# Patient Record
Sex: Female | Born: 1965 | Hispanic: Yes | Marital: Married | Smoking: Never smoker
Health system: Southern US, Community
[De-identification: ages and names within clinical notes are randomized; demographics above are authoritative.]

## PROBLEM LIST (undated history)

## (undated) DIAGNOSIS — M1990 Unspecified osteoarthritis, unspecified site: Secondary | ICD-10-CM

## (undated) DIAGNOSIS — A048 Other specified bacterial intestinal infections: Secondary | ICD-10-CM

## (undated) DIAGNOSIS — U071 COVID-19: Secondary | ICD-10-CM

## (undated) DIAGNOSIS — D649 Anemia, unspecified: Secondary | ICD-10-CM

## (undated) DIAGNOSIS — N61 Mastitis without abscess: Secondary | ICD-10-CM

## (undated) DIAGNOSIS — I10 Essential (primary) hypertension: Secondary | ICD-10-CM

## (undated) DIAGNOSIS — E119 Type 2 diabetes mellitus without complications: Secondary | ICD-10-CM

## (undated) HISTORY — DX: Type 2 diabetes mellitus without complications: E11.9

## (undated) HISTORY — DX: Other specified bacterial intestinal infections: A04.8

## (undated) HISTORY — PX: TONSILLECTOMY: SHX5217

## (undated) HISTORY — PX: FOOT SURGERY: SHX648

## (undated) HISTORY — PX: TONSILLECTOMY: SUR1361

## (undated) HISTORY — DX: Mastitis without abscess: N61.0

---

## 1992-04-23 HISTORY — PX: TUBAL LIGATION: SHX77

## 1992-04-23 HISTORY — PX: BOWEL RESECTION: SHX1257

## 1992-04-23 HISTORY — PX: CHOLECYSTECTOMY: SHX55

## 2005-07-07 ENCOUNTER — Emergency Department: Payer: Self-pay | Admitting: Emergency Medicine

## 2005-12-05 ENCOUNTER — Inpatient Hospital Stay: Payer: Self-pay | Admitting: Internal Medicine

## 2005-12-05 ENCOUNTER — Other Ambulatory Visit: Payer: Self-pay

## 2006-04-02 ENCOUNTER — Ambulatory Visit: Payer: Self-pay

## 2006-04-23 HISTORY — PX: TONSILLECTOMY: SUR1361

## 2006-12-17 ENCOUNTER — Ambulatory Visit: Payer: Self-pay | Admitting: Internal Medicine

## 2007-01-15 ENCOUNTER — Ambulatory Visit: Payer: Self-pay | Admitting: Internal Medicine

## 2007-05-15 ENCOUNTER — Ambulatory Visit: Payer: Self-pay | Admitting: Otolaryngology

## 2007-05-15 ENCOUNTER — Other Ambulatory Visit: Payer: Self-pay

## 2007-05-21 ENCOUNTER — Ambulatory Visit: Payer: Self-pay | Admitting: Otolaryngology

## 2007-12-15 ENCOUNTER — Emergency Department: Payer: Self-pay | Admitting: Unknown Physician Specialty

## 2009-06-20 ENCOUNTER — Encounter: Payer: Self-pay | Admitting: Orthopedic Surgery

## 2009-06-21 ENCOUNTER — Encounter: Payer: Self-pay | Admitting: Orthopedic Surgery

## 2009-07-22 ENCOUNTER — Encounter: Payer: Self-pay | Admitting: Orthopedic Surgery

## 2009-09-07 ENCOUNTER — Ambulatory Visit: Payer: Self-pay | Admitting: Orthopedic Surgery

## 2009-10-06 ENCOUNTER — Ambulatory Visit: Payer: Self-pay | Admitting: Pain Medicine

## 2009-10-12 ENCOUNTER — Ambulatory Visit: Payer: Self-pay | Admitting: Pain Medicine

## 2009-11-15 ENCOUNTER — Ambulatory Visit: Payer: Self-pay | Admitting: Pain Medicine

## 2010-01-31 ENCOUNTER — Ambulatory Visit: Payer: Self-pay | Admitting: Internal Medicine

## 2010-02-09 ENCOUNTER — Ambulatory Visit: Payer: Self-pay | Admitting: Internal Medicine

## 2010-02-11 ENCOUNTER — Emergency Department: Payer: Self-pay | Admitting: Emergency Medicine

## 2011-05-07 ENCOUNTER — Ambulatory Visit: Payer: Self-pay | Admitting: Internal Medicine

## 2011-08-01 ENCOUNTER — Ambulatory Visit: Payer: Self-pay | Admitting: Internal Medicine

## 2011-08-01 LAB — CBC CANCER CENTER
Basophil #: 0 x10 3/mm (ref 0.0–0.1)
Basophil %: 0.3 %
Eosinophil #: 0.1 x10 3/mm (ref 0.0–0.7)
Eosinophil %: 0.8 %
HCT: 34.6 % — ABNORMAL LOW (ref 35.0–47.0)
HGB: 11.4 g/dL — ABNORMAL LOW (ref 12.0–16.0)
Lymphocyte #: 1.5 x10 3/mm (ref 1.0–3.6)
Lymphocyte %: 20 %
MCH: 27.2 pg (ref 26.0–34.0)
MCHC: 32.9 g/dL (ref 32.0–36.0)
MCV: 83 fL (ref 80–100)
Monocyte #: 0.4 x10 3/mm (ref 0.2–0.9)
Monocyte %: 5.8 %
Neutrophil #: 5.4 x10 3/mm (ref 1.4–6.5)
Neutrophil %: 73.1 %
Platelet: 430 x10 3/mm (ref 150–440)
RBC: 4.19 10*6/uL (ref 3.80–5.20)
RDW: 13.9 % (ref 11.5–14.5)
WBC: 7.4 x10 3/mm (ref 3.6–11.0)

## 2011-08-01 LAB — IRON AND TIBC
Iron Bind.Cap.(Total): 445 ug/dL (ref 250–450)
Iron Saturation: 11 %
Iron: 49 ug/dL — ABNORMAL LOW (ref 50–170)
Unbound Iron-Bind.Cap.: 396 ug/dL

## 2011-08-01 LAB — RETICULOCYTES
Absolute Retic Count: 0.128 10*6/uL — ABNORMAL HIGH (ref 0.024–0.084)
Reticulocyte: 3.1 % — ABNORMAL HIGH (ref 0.5–1.5)

## 2011-08-01 LAB — FERRITIN: Ferritin (ARMC): 28 ng/mL (ref 8–388)

## 2011-08-22 ENCOUNTER — Ambulatory Visit: Payer: Self-pay | Admitting: Internal Medicine

## 2011-08-24 LAB — CANCER CENTER HEMOGLOBIN: HGB: 11.2 g/dL — ABNORMAL LOW (ref 12.0–16.0)

## 2011-08-31 ENCOUNTER — Ambulatory Visit: Payer: Self-pay | Admitting: Obstetrics and Gynecology

## 2011-08-31 LAB — PREGNANCY, URINE: Pregnancy Test, Urine: NEGATIVE m[IU]/mL

## 2011-08-31 LAB — POTASSIUM: Potassium: 3.6 mmol/L (ref 3.5–5.1)

## 2011-09-04 ENCOUNTER — Ambulatory Visit: Payer: Self-pay | Admitting: Obstetrics and Gynecology

## 2011-09-04 HISTORY — PX: ABLATION ON ENDOMETRIOSIS: SHX5787

## 2011-09-07 LAB — CBC CANCER CENTER
Basophil #: 0 x10 3/mm (ref 0.0–0.1)
Basophil %: 0.3 %
Eosinophil #: 0.1 x10 3/mm (ref 0.0–0.7)
Eosinophil %: 0.7 %
HCT: 35.5 % (ref 35.0–47.0)
HGB: 11.5 g/dL — ABNORMAL LOW (ref 12.0–16.0)
Lymphocyte #: 1.4 x10 3/mm (ref 1.0–3.6)
Lymphocyte %: 18.4 %
MCH: 27 pg (ref 26.0–34.0)
MCHC: 32.4 g/dL (ref 32.0–36.0)
MCV: 83 fL (ref 80–100)
Monocyte #: 0.4 x10 3/mm (ref 0.2–0.9)
Monocyte %: 6 %
Neutrophil #: 5.6 x10 3/mm (ref 1.4–6.5)
Neutrophil %: 74.6 %
Platelet: 368 x10 3/mm (ref 150–440)
RBC: 4.26 10*6/uL (ref 3.80–5.20)
RDW: 13.9 % (ref 11.5–14.5)
WBC: 7.4 x10 3/mm (ref 3.6–11.0)

## 2011-09-07 LAB — IRON AND TIBC
Iron Bind.Cap.(Total): 376 ug/dL (ref 250–450)
Iron Saturation: 16 %
Iron: 62 ug/dL (ref 50–170)
Unbound Iron-Bind.Cap.: 314 ug/dL

## 2011-09-12 LAB — PATHOLOGY REPORT

## 2011-09-22 ENCOUNTER — Ambulatory Visit: Payer: Self-pay | Admitting: Internal Medicine

## 2011-10-05 LAB — CBC CANCER CENTER
Basophil #: 0 x10 3/mm (ref 0.0–0.1)
Basophil %: 0.5 %
Eosinophil #: 0.1 x10 3/mm (ref 0.0–0.7)
Eosinophil %: 1 %
HCT: 34.7 % — ABNORMAL LOW (ref 35.0–47.0)
HGB: 11.3 g/dL — ABNORMAL LOW (ref 12.0–16.0)
Lymphocyte #: 1.6 x10 3/mm (ref 1.0–3.6)
Lymphocyte %: 21.5 %
MCH: 27.1 pg (ref 26.0–34.0)
MCHC: 32.4 g/dL (ref 32.0–36.0)
MCV: 84 fL (ref 80–100)
Monocyte #: 0.6 x10 3/mm (ref 0.2–0.9)
Monocyte %: 7.5 %
Neutrophil #: 5.3 x10 3/mm (ref 1.4–6.5)
Neutrophil %: 69.5 %
Platelet: 396 x10 3/mm (ref 150–440)
RBC: 4.16 10*6/uL (ref 3.80–5.20)
RDW: 14 % (ref 11.5–14.5)
WBC: 7.6 x10 3/mm (ref 3.6–11.0)

## 2011-10-05 LAB — IRON AND TIBC
Iron Bind.Cap.(Total): 392 ug/dL (ref 250–450)
Iron Saturation: 11 %
Iron: 44 ug/dL — ABNORMAL LOW (ref 50–170)
Unbound Iron-Bind.Cap.: 348 ug/dL

## 2011-10-22 ENCOUNTER — Ambulatory Visit: Payer: Self-pay | Admitting: Internal Medicine

## 2011-12-10 ENCOUNTER — Emergency Department: Payer: Self-pay | Admitting: Unknown Physician Specialty

## 2012-02-07 ENCOUNTER — Ambulatory Visit: Payer: Self-pay | Admitting: Internal Medicine

## 2012-04-23 DIAGNOSIS — N61 Mastitis without abscess: Secondary | ICD-10-CM

## 2012-04-23 HISTORY — PX: FOOT SURGERY: SHX648

## 2012-04-23 HISTORY — DX: Mastitis without abscess: N61.0

## 2012-06-24 ENCOUNTER — Ambulatory Visit: Payer: Self-pay | Admitting: Internal Medicine

## 2012-07-01 ENCOUNTER — Encounter: Payer: Self-pay | Admitting: *Deleted

## 2012-07-14 ENCOUNTER — Ambulatory Visit: Payer: Self-pay | Admitting: General Surgery

## 2012-07-23 ENCOUNTER — Ambulatory Visit (INDEPENDENT_AMBULATORY_CARE_PROVIDER_SITE_OTHER): Payer: 59 | Admitting: General Surgery

## 2012-07-23 ENCOUNTER — Encounter: Payer: Self-pay | Admitting: General Surgery

## 2012-07-23 VITALS — BP 146/92 | HR 62 | Resp 14 | Ht 66.0 in | Wt 297.0 lb

## 2012-07-23 DIAGNOSIS — N63 Unspecified lump in unspecified breast: Secondary | ICD-10-CM
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
## 2012-08-07 ENCOUNTER — Telehealth: Payer: Self-pay | Admitting: General Surgery

## 2012-08-07 NOTE — Telephone Encounter (Signed)
The patient's mammogram was reviewed with the radiologist. It is felt at this time that the findings on mammogram and ultrasound are all very low suspicion and has been changed from category 4 to4A. Patient was called by telephone and advised. A 6 month followup will will be arranged. At that time she will have a a right mammogram and office appointment after for ultrasound and and clinical exam.

## 2012-08-15 ENCOUNTER — Other Ambulatory Visit: Payer: Self-pay | Admitting: *Deleted

## 2012-08-15 DIAGNOSIS — N63 Unspecified lump in unspecified breast: Secondary | ICD-10-CM

## 2012-08-15 NOTE — Progress Notes (Signed)
Patient to have a unilateral right breast diagnostic mammogram in September 2014. This patient will need an office ultrasound.

## 2012-09-08 ENCOUNTER — Ambulatory Visit: Payer: Self-pay | Admitting: Podiatry

## 2012-10-14 ENCOUNTER — Telehealth: Payer: Self-pay | Admitting: *Deleted

## 2012-10-14 NOTE — Telephone Encounter (Signed)
Phone call from pt states she has noticed some skin color changes in the right breast and constant right breast pain.  Was seen April 2014. In recalls for Sept. Pt wants to be seen sooner for reassurance.

## 2012-10-27 ENCOUNTER — Inpatient Hospital Stay
Admission: RE | Admit: 2012-10-27 | Discharge: 2012-10-27 | Disposition: A | Discharge status: Home / Self Care | Payer: Self-pay | Source: Ambulatory Visit | Attending: General Surgery | Admitting: General Surgery

## 2012-10-27 ENCOUNTER — Encounter: Payer: Self-pay | Admitting: General Surgery

## 2012-10-27 ENCOUNTER — Ambulatory Visit (INDEPENDENT_AMBULATORY_CARE_PROVIDER_SITE_OTHER): Payer: 59 | Admitting: General Surgery

## 2012-10-27 VITALS — BP 130/78 | HR 78 | Resp 14 | Ht 66.0 in | Wt 306.0 lb

## 2012-10-27 DIAGNOSIS — N63 Unspecified lump in unspecified breast: Secondary | ICD-10-CM | POA: Insufficient documentation

## 2012-10-27 DIAGNOSIS — N61 Mastitis without abscess: Secondary | ICD-10-CM | POA: Insufficient documentation

## 2012-10-27 MED ORDER — SULFAMETHOXAZOLE-TRIMETHOPRIM 800-160 MG PO TABS: 1.0000 | ORAL_TABLET | Freq: Two times a day (BID) | ORAL | Status: AC

## 2012-10-27 NOTE — Patient Instructions (Addendum)
Take antibiotics as directed. Use ice off and on for 48 hours.

## 2012-10-27 NOTE — Progress Notes (Signed)
Patient ID: Paula Flores, female   DOB: Mar 12, 1966, 46 y.o.   MRN: [id number]  Chief Complaint  Patient presents with  . Other    right breast pain    HPI Paula Flores is a 47 y.o. female here today for continued right breast pain since her last visit in April. Patient reports skin color changes have enlarged and the pain has been increasing. The pain is reported as constant. Patient reports having right foot surgery May 30th, and is currently on pain meds for this. She was scheduled to follow up here in October.HPI  History reviewed. No pertinent past medical history.  Past Surgical History  Procedure Laterality Date  . Cesarean section    . Cholecystectomy    . Tonsillectomy    . Foot surgery Right     History reviewed. No pertinent family history.  Social History History  Substance Use Topics  . Smoking status: Never Smoker   . Smokeless tobacco: Never Used  . Alcohol Use: No    Allergies  Allergen Reactions  . Penicillins Rash    Current Outpatient Prescriptions  Medication Sig Dispense Refill  . aspirin 81 MG tablet Take 81 mg by mouth daily.      Marland Kitchen losartan-hydrochlorothiazide (HYZAAR) 100-25 MG per tablet Take 100 tablets by mouth daily.      . metoprolol tartrate (LOPRESSOR) 25 MG tablet Take 25 mg by mouth 2 (two) times daily.      Marland Kitchen omeprazole (PRILOSEC) 20 MG capsule Take 20 mg by mouth daily.      Marland Kitchen oxyCODONE-acetaminophen (PERCOCET) 10-325 MG per tablet Take 1 tablet by mouth every 4 (four) hours as needed for pain.      Marland Kitchen sulfamethoxazole-trimethoprim (BACTRIM DS) 800-160 MG per tablet Take 1 tablet by mouth 2 (two) times daily.  6 tablet  0   No current facility-administered medications for this visit.    Review of Systems Review of Systems  Constitutional: Negative.   Respiratory: Negative.   Cardiovascular: Negative.     Blood pressure 130/78, pulse 78, resp. rate 14, height 5\' 6"  (1.676 m), weight 306 lb (138.801 kg).  Physical  Exam Physical Exam  Constitutional: She is oriented to person, place, and time. She appears well-developed and well-nourished.  Pulmonary/Chest: Right breast exhibits mass (illdefined thickening upper inner quadrant approx 3 cm with some overlying skin redness. Also noted 2 cm  simular thickening 8o'clock  location. ) and tenderness. Right breast exhibits no nipple discharge. Left breast exhibits no inverted nipple, no mass, no nipple discharge, no skin change and no tenderness.  Neurological: She is alert and oriented to person, place, and time.    Data Reviewed Ultrasound performed here. No defined mass or cyst seen in palpable areas of thickening.   Assessment    Likely mastitis associated with possible fat necrosis     Plan  Trial of antibiotic Septra DS        SANKAR,SEEPLAPUTHUR G 10/28/2012, 7:05 AM

## 2012-10-28 ENCOUNTER — Encounter: Payer: Self-pay | Admitting: General Surgery

## 2012-11-12 ENCOUNTER — Encounter: Payer: Self-pay | Admitting: General Surgery

## 2012-11-12 ENCOUNTER — Ambulatory Visit (INDEPENDENT_AMBULATORY_CARE_PROVIDER_SITE_OTHER): Payer: 59 | Admitting: General Surgery

## 2012-11-12 VITALS — BP 138/70 | HR 78 | Resp 14 | Ht 66.0 in | Wt 301.0 lb

## 2012-11-12 DIAGNOSIS — N61 Mastitis without abscess: Secondary | ICD-10-CM

## 2012-11-12 DIAGNOSIS — N63 Unspecified lump in unspecified breast: Secondary | ICD-10-CM

## 2012-11-12 NOTE — Progress Notes (Signed)
Patient ID: Paula Flores, female   DOB: 1965-09-09, 47 y.o.   MRN: [id number]  Chief Complaint  Patient presents with  . Follow-up    2 week follow up mastitis    HPI Paula Flores is a 47 y.o. female who presents for a 2 week follow up of mastitis in right breast. The patient states she is doing much better since her last visit. She denies any new problems at this time.   HPI  Past Medical History  Diagnosis Date  . Mastitis 2014    Past Surgical History  Procedure Laterality Date  . Cesarean section    . Cholecystectomy    . Tonsillectomy    . Foot surgery Right     History reviewed. No pertinent family history.  Social History History  Substance Use Topics  . Smoking status: Never Smoker   . Smokeless tobacco: Never Used  . Alcohol Use: No    Allergies  Allergen Reactions  . Penicillins Rash    Current Outpatient Prescriptions  Medication Sig Dispense Refill  . losartan-hydrochlorothiazide (HYZAAR) 100-25 MG per tablet Take 100 tablets by mouth daily.      . metoprolol tartrate (LOPRESSOR) 25 MG tablet Take 25 mg by mouth 2 (two) times daily.      Marland Kitchen oxyCODONE-acetaminophen (PERCOCET) 10-325 MG per tablet Take 1 tablet by mouth every 4 (four) hours as needed for pain.       No current facility-administered medications for this visit.    Review of Systems Review of Systems  Constitutional: Negative.   Respiratory: Negative.   Cardiovascular: Negative.     Blood pressure 138/70, pulse 78, resp. rate 14, height 5\' 6"  (1.676 m), weight 301 lb (136.533 kg).  Physical Exam Physical Exam  Constitutional: She is oriented to person, place, and time. She appears well-developed and well-nourished.  Neurological: She is alert and oriented to person, place, and time.  Skin: Skin is warm and dry.  Right breast upper inner quadrant - prior redness has resolved mild thickening underneath much improved. No new findings  Data Reviewed None  Assessment    Fat  necrosis appears to have resolved.      Plan    Follow up as scheduled in October 2014.        SANKAR,SEEPLAPUTHUR G 11/12/2012, 10:48 AM

## 2012-11-12 NOTE — Patient Instructions (Addendum)
Patient to return in October 2014 as previously discussed. Patient to have mammogram done prior to October appointment.

## 2013-01-15 ENCOUNTER — Ambulatory Visit: Payer: 59 | Admitting: General Surgery

## 2013-01-27 ENCOUNTER — Ambulatory Visit: Payer: Self-pay | Admitting: General Surgery

## 2013-01-28 ENCOUNTER — Encounter: Payer: Self-pay | Admitting: General Surgery

## 2013-02-04 ENCOUNTER — Ambulatory Visit: Payer: 59 | Admitting: General Surgery

## 2013-02-09 ENCOUNTER — Ambulatory Visit (INDEPENDENT_AMBULATORY_CARE_PROVIDER_SITE_OTHER): Payer: 59 | Admitting: Podiatry

## 2013-02-09 ENCOUNTER — Encounter: Payer: Self-pay | Admitting: Podiatry

## 2013-02-09 VITALS — BP 147/96 | HR 81 | Resp 16 | Ht 66.0 in | Wt 293.0 lb

## 2013-02-09 DIAGNOSIS — M7750 Other enthesopathy of unspecified foot: Secondary | ICD-10-CM

## 2013-02-09 DIAGNOSIS — M778 Other enthesopathies, not elsewhere classified: Secondary | ICD-10-CM

## 2013-02-09 NOTE — Progress Notes (Signed)
Malachi Bonds presents today for chief complaint of pain to the lateral aspect of her right foot. His been several months now the [facility] had an endoscopic plantar fasciotomy performed and a digger several months to recover. She continues wear her orthotics in her work shoes daily. She states it feels like her foot rolling to the outside and this was making her foot hurt. She states that she giving it a four-week and her boss is suggesting that she consider short-term disability.  Objective: Vital signs are stable she is alert oriented x3 I have reviewed her past medical history medications and allergies. Pulses are strongly palpable bilateral lower extremity pain on palpation to the fourth fifth met cuboid articulation. Radiographic evaluation does not demonstrate any type of osseous abnormalities. No pain on palpation he continued tubercle of the right heel. Mild tenderness on palpation of the peroneal tendons at the insertion site of the fifth metatarsal.  Assessment: Capsulitis of the fourth fifth metatarsal cuboid articulation. All peroneal tendinitis.  Plan: Injected the area today with 20 mg of Kenalog and local anesthetic to the point of maximal tenderness. I will followup with her in 3 weeks.

## 2013-02-17 ENCOUNTER — Ambulatory Visit: Payer: Self-pay | Admitting: Anesthesiology

## 2013-02-18 ENCOUNTER — Other Ambulatory Visit: Payer: Self-pay | Admitting: Anesthesiology

## 2013-02-18 ENCOUNTER — Ambulatory Visit: Payer: Self-pay | Admitting: Anesthesiology

## 2013-02-18 LAB — CBC WITH DIFFERENTIAL/PLATELET
Basophil #: 0.1 10*3/uL (ref 0.0–0.1)
Basophil %: 1 %
Eosinophil #: 0.2 10*3/uL (ref 0.0–0.7)
Eosinophil %: 2.1 %
HCT: 38.4 % (ref 35.0–47.0)
HGB: 12.7 g/dL (ref 12.0–16.0)
Lymphocyte #: 1.9 10*3/uL (ref 1.0–3.6)
Lymphocyte %: 19.7 %
MCH: 27.1 pg (ref 26.0–34.0)
MCHC: 33.2 g/dL (ref 32.0–36.0)
MCV: 82 fL (ref 80–100)
Monocyte #: 0.7 x10 3/mm (ref 0.2–0.9)
Monocyte %: 6.7 %
Neutrophil #: 6.9 10*3/uL — ABNORMAL HIGH (ref 1.4–6.5)
Neutrophil %: 70.5 %
Platelet: 425 10*3/uL (ref 150–440)
RBC: 4.7 10*6/uL (ref 3.80–5.20)
RDW: 13.9 % (ref 11.5–14.5)
WBC: 9.7 10*3/uL (ref 3.6–11.0)

## 2013-03-02 ENCOUNTER — Ambulatory Visit (INDEPENDENT_AMBULATORY_CARE_PROVIDER_SITE_OTHER): Payer: 59 | Admitting: Podiatry

## 2013-03-02 VITALS — BP 141/96 | HR 74 | Resp 16 | Ht 66.0 in | Wt 297.0 lb

## 2013-03-02 DIAGNOSIS — L600 Ingrowing nail: Secondary | ICD-10-CM

## 2013-03-02 MED ORDER — NEOMYCIN-POLYMYXIN-HC 3.5-10000-1 OT SOLN
OTIC | Status: DC
Start: 2013-03-02 — End: 2014-08-16

## 2013-03-02 NOTE — Progress Notes (Signed)
Paula Flores presents today for followup of her dorsolateral pain to her right foot. The last time she was in we injected the area at the fourth fifth metatarsocuboid articulation. She states it is a nice to be able to walk without pain. She is 100% better. She's concerned today about the pain to the toenail of her left hallux. She states the way the toenail turns in it hurts her and she would like to have the holding removed foot all possible.  Objective: Vital signs are stable she is alert and oriented x3. She has no pain on palpation to the dorsal lateral aspect of the right foot. She does have pain on palpation to the sharp incurvated nail margins to the tibial and fibular border of the hallux left it does demonstrate a mycotic the very least dystrophic nail.  Assessment: Well-healing capsulitis right foot. Ingrown nail hallux left.  Plan: We discussed the etiology pathology conservative versus surgical therapies. At this point we've chosen to her perform a total matrixectomy to the hallux left this is performed today with phenol and she was neutralized last couple alcohol no complications. She was her soaking on a twice a day basis and Betadine water and I will followup with her in one week.

## 2013-03-02 NOTE — Patient Instructions (Signed)

## 2013-03-10 ENCOUNTER — Encounter: Payer: Self-pay | Admitting: *Deleted

## 2013-03-12 ENCOUNTER — Ambulatory Visit (INDEPENDENT_AMBULATORY_CARE_PROVIDER_SITE_OTHER): Payer: 59 | Admitting: Podiatry

## 2013-03-12 ENCOUNTER — Encounter: Payer: Self-pay | Admitting: Podiatry

## 2013-03-12 VITALS — BP 133/86 | HR 85 | Resp 16 | Ht 66.0 in | Wt 297.0 lb

## 2013-03-12 DIAGNOSIS — Z9889 Other specified postprocedural states: Secondary | ICD-10-CM

## 2013-03-12 NOTE — Progress Notes (Signed)
Brie presents today for followup of her matrixectomy hallux left. She's been soaking in Epsom salts in warm water.  Objective: Vital signs are stable she is alert and oriented x3. There is no erythema edema cellulitis drainage or odor to the hallux left. Epithelialization is occurring. No signs of infection.  Assessment: Well-healing hallux matrixectomy left.  Plan: Discontinue the use of Betadine is still using it. We'll start using Epsom salts in warm water on a twice a day basis he continue to do so if until completely healed. I will followup with her as needed.

## 2013-03-17 ENCOUNTER — Telehealth: Payer: Self-pay | Admitting: *Deleted

## 2013-03-17 NOTE — Telephone Encounter (Signed)
PT CALLED SAID HER TOE HAS PUSS COMING OUT OF IT AND ITS ACHING.

## 2013-03-17 NOTE — Telephone Encounter (Signed)
Have her in to see me tomorrow.

## 2013-03-18 ENCOUNTER — Ambulatory Visit: Payer: 59 | Admitting: Podiatry

## 2013-03-23 ENCOUNTER — Ambulatory Visit: Payer: Self-pay | Admitting: Anesthesiology

## 2013-03-25 ENCOUNTER — Encounter: Payer: Self-pay | Admitting: Podiatry

## 2013-03-25 ENCOUNTER — Ambulatory Visit (INDEPENDENT_AMBULATORY_CARE_PROVIDER_SITE_OTHER): Payer: 59 | Admitting: Podiatry

## 2013-03-25 VITALS — BP 166/118 | HR 76 | Resp 16 | Ht 66.0 in | Wt 297.0 lb

## 2013-03-25 DIAGNOSIS — L600 Ingrowing nail: Secondary | ICD-10-CM

## 2013-03-25 MED ORDER — CLINDAMYCIN HCL 150 MG PO CAPS: 150.0000 mg | ORAL_CAPSULE | Freq: Three times a day (TID) | ORAL | Status: DC

## 2013-03-25 NOTE — Progress Notes (Signed)
Paula Flores presents today for followup of her hallux nail avulsion left foot she states that it seems to be getting better since they have been taking his antibiotics and she demonstrates a package of amoxicillin to me.  Objective: Vital signs are stable she is alert and oriented x3. The toe does demonstrate a very mild cellulitis the proximal nail fold. No. Was no malodor is noted on palpation and exsanguination.  Assessment: Cellulitis hallux left. This appears to be slowly healing.  Plan: Wrote her prescription for clindamycin and she will continue application of topical antibiotic ointment and I will followup with her in 3-4 weeks.

## 2013-04-13 ENCOUNTER — Ambulatory Visit: Payer: 59 | Admitting: Podiatry

## 2013-06-15 ENCOUNTER — Ambulatory Visit (INDEPENDENT_AMBULATORY_CARE_PROVIDER_SITE_OTHER): Payer: 59 | Admitting: Podiatry

## 2013-06-15 ENCOUNTER — Encounter: Payer: Self-pay | Admitting: Podiatry

## 2013-06-15 VITALS — BP 97/69 | HR 75 | Resp 16

## 2013-06-15 DIAGNOSIS — M79609 Pain in unspecified limb: Secondary | ICD-10-CM

## 2013-06-15 DIAGNOSIS — M722 Plantar fascial fibromatosis: Secondary | ICD-10-CM

## 2013-06-15 MED ORDER — METHYLPREDNISOLONE (PAK) 4 MG PO TABS: ORAL_TABLET | ORAL | Status: DC

## 2013-06-15 NOTE — Progress Notes (Signed)
The right heel is hurting again. She is status post endoscopic plantar fasciotomy of the plantar fascia. She states that she's having plantar pain just distal to the insertion site of the plantar fascia on the calcaneus. She also has pain on lateral aspect of the left foot. She continues to wear her orthotics and a pair of Fila tissues. She states that she's had the pain to the left 5 weeks or so.  Objective: Vital signs are stable she is alert and oriented x3. She has pain on palpation medial calcaneal tubercle and plantar central calcaneal tubercle of the right heel. She also has tenderness on palpation of the fourth fifth metatarsocuboid articulation of the right foot.  Assessment residual plantar fasciitis probable central band with lateral compensatory syndrome right foot.  Plan: Reinjected the right heel today and plantar fascial calcaneal insertion I central bands. And wrote her another prescription for steroid dose pack. We also had her rescan for another pair orthotics. Remember to ask her however trip to [facility] went April 1.

## 2013-07-08 ENCOUNTER — Telehealth: Payer: Self-pay | Admitting: Podiatry

## 2013-07-29 ENCOUNTER — Encounter: Payer: Self-pay | Admitting: Podiatry

## 2013-07-29 ENCOUNTER — Ambulatory Visit (INDEPENDENT_AMBULATORY_CARE_PROVIDER_SITE_OTHER): Payer: 59 | Admitting: Podiatry

## 2013-07-29 DIAGNOSIS — M722 Plantar fascial fibromatosis: Secondary | ICD-10-CM

## 2013-07-29 NOTE — Patient Instructions (Signed)

## 2013-07-29 NOTE — Progress Notes (Signed)
Dispensed patient's orthotics with oral and written instructions for wearing. Patient will follow up with Dr. Milinda Pointer in 1 month for an orthotic check or as needed

## 2013-08-31 ENCOUNTER — Ambulatory Visit (INDEPENDENT_AMBULATORY_CARE_PROVIDER_SITE_OTHER): Payer: 59

## 2013-08-31 ENCOUNTER — Ambulatory Visit (INDEPENDENT_AMBULATORY_CARE_PROVIDER_SITE_OTHER): Payer: 59 | Admitting: Podiatry

## 2013-08-31 VITALS — BP 127/71 | HR 68 | Resp 16

## 2013-08-31 DIAGNOSIS — M79609 Pain in unspecified limb: Secondary | ICD-10-CM

## 2013-08-31 DIAGNOSIS — M722 Plantar fascial fibromatosis: Secondary | ICD-10-CM

## 2013-08-31 DIAGNOSIS — M79673 Pain in unspecified foot: Secondary | ICD-10-CM

## 2013-08-31 DIAGNOSIS — M779 Enthesopathy, unspecified: Secondary | ICD-10-CM

## 2013-08-31 DIAGNOSIS — M7750 Other enthesopathy of unspecified foot: Secondary | ICD-10-CM

## 2013-08-31 DIAGNOSIS — M778 Other enthesopathies, not elsewhere classified: Secondary | ICD-10-CM

## 2013-08-31 NOTE — Progress Notes (Signed)
Paula Flores presents today with a chief complaint of pain to her lateral aspect of her right foot and to the plantar medial calcaneal tubercle area. She states that this is been 1 on for quite some time.  Objective: Vital signs are stable she is alert and oriented x3. Pulses are palpable right foot. She has pain on palpation to the fourth fifth met cuboid articulation site as well as to the plantar aspect of the medial calcaneus.  Assessment: Plantar fasciitis with lateral compensatory syndrome resulting in capsulitis of the fourth fifth met cuboid articulation.  Ran: Discussed etiology pathology conservative versus surgical therapies. Injected both sites today with Kenalog and local anesthetic I will followup with her as needed.

## 2013-10-12 ENCOUNTER — Ambulatory Visit (INDEPENDENT_AMBULATORY_CARE_PROVIDER_SITE_OTHER): Payer: 59 | Admitting: Podiatry

## 2013-10-12 ENCOUNTER — Encounter: Payer: Self-pay | Admitting: Podiatry

## 2013-10-12 DIAGNOSIS — M7750 Other enthesopathy of unspecified foot: Secondary | ICD-10-CM

## 2013-10-12 DIAGNOSIS — M778 Other enthesopathies, not elsewhere classified: Secondary | ICD-10-CM

## 2013-10-12 DIAGNOSIS — M779 Enthesopathy, unspecified: Principal | ICD-10-CM

## 2013-10-12 NOTE — Progress Notes (Signed)
She presents today for followup of her pain to the lateral aspect of her right foot she states it is approximately 50% better. The pain is she is having now is extending from the lateral aspect of her right foot at the posterior aspect of her leg.  Objective: Vital signs are stable she is alert and oriented x3. Pulses are palpable. She has pain on palpation of the fourth fifth 2 Boyd articulation.  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  Plan: Injected the fourth fifth met cuboid articulation with Kenalog and local anesthetic today I will followup with her in one month

## 2013-11-09 ENCOUNTER — Ambulatory Visit: Payer: 59 | Admitting: Podiatry

## 2014-01-06 ENCOUNTER — Ambulatory Visit (INDEPENDENT_AMBULATORY_CARE_PROVIDER_SITE_OTHER): Payer: 59 | Admitting: Podiatry

## 2014-01-06 VITALS — BP 149/91 | HR 65 | Resp 16

## 2014-01-06 DIAGNOSIS — D2120 Benign neoplasm of connective and other soft tissue of unspecified lower limb, including hip: Secondary | ICD-10-CM

## 2014-01-06 DIAGNOSIS — M722 Plantar fascial fibromatosis: Secondary | ICD-10-CM

## 2014-01-06 DIAGNOSIS — D3613 Benign neoplasm of peripheral nerves and autonomic nervous system of lower limb, including hip: Secondary | ICD-10-CM

## 2014-01-06 NOTE — Progress Notes (Signed)
She presents today complaining of pain to the right heel and to the dorsal lateral aspect of the right foot. She denies any changes in her past medical history medications allergies.  Objective: Vital signs are stable she is alert oriented x3 she has what appears to be a plantar fibroma to the central band of the plantar fascia of the right heel just distal to the insertion site and to the surgical site. She also has a palpable Mulder's click to the third interdigital space of the right foot. With radiating pain.  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  Plan: Discussed etiology pathology conservative versus surgical therapies. At this point we injected the plantar fibroma right heel. Injected the neuroma third interdigital space right foot. Both with Kenalog and local anesthetic. Discussed appropriate shoe gear stretching exercises ice therapy shoe gear modifications and I will followup with her in one to 2 months.

## 2014-02-22 ENCOUNTER — Encounter: Payer: Self-pay | Admitting: Podiatry

## 2014-06-09 ENCOUNTER — Emergency Department: Payer: Self-pay | Admitting: Emergency Medicine

## 2014-08-13 NOTE — H&P (Signed)
PATIENT NAME:  Paula Flores, Paula Flores MR#:  [id number] DATE OF BIRTH:  [birth year]  DATE OF ADMISSION:  02/17/2013  CHIEF COMPLAINT:  Persistent intractable low back pain.   PROCEDURE:  None.   HISTORY OF PRESENT ILLNESS:  Ms. Ronalda Walpole is a pleasant 49 year old Hispanic female with a long-standing history of low back pain.  This began following a recent foot surgery she had where she was forced to wear a protective boot for approximately two months, began developing low back pain following that.  She is unaware of any other inciting events which may have caused or exacerbated her pain.  Otherwise, she is describing an aching, annoying, sharp pain that starts in the low back with radiation into the bilateral buttocks and posterior legs.  She does not have any bowel or bladder dysfunction.  She has had some spasming in the posterior legs and calves and the pain wakes her up at night, but she does not report any weakness or numbness or tingling.  The pain is described as aching, annoying, deep, dull, exhausting and sharp.  Aggravating factors include bending, climbing, kneeling, lifting, motion, twisting, walking.  It is worse with activity.  Maximized VAS score is a 10 and better with rest, sleep and medication management.  She had a previous MRI scan that stated 01/07/2013 which shows evidence of an L4 to L5 facet arthropathy and at L5 to S1 there is disk desiccation and diffuse bulge with central disk protrusion which indents the thecal sac.  There is also facet hypertrophy bilaterally.  There is left neural foraminal stenosis and right neural foraminal encroachment.   PAST MEDICAL HISTORY:  Significant for hypertension, chronic renal insufficiency, hyperlipidemia, a distant history of thrombocytopenia, anemia, hypertension and hyperglycemia.   ALLERGIES:  PENICILLIN.   CURRENT MEDICATIONS:  Norvasc, metoprolol, hydrochlorothiazide and losartan, tramadol as needed for pain 2 to 3 times a day and  omeprazole.   FAMILY HISTORY:  She has a family history of hypertension.   SOCIAL HISTORY:  She has never smoked.  Denies tobacco or alcohol use.    PAST SURGICAL HISTORY:  Significant for C-section, gallbladder, tubal ligation.  Of note with the gallbladder surgery she was followed by Dr. Chauncey Reading for some chronic right upper quadrant abdominal pain and is being referred to [facility] for evaluation of this.   PHYSICAL EXAMINATION:   GENERAL:  Reveals a pleasant Hispanic female who speaks some broken [place], but is here with an interpreter, [place].   HEENT:  Pupils are equally round and reactive to light.  Extraocular muscles intact.  HEART:  Regular rate and rhythm.  LUNGS:  Clear to auscultation bilaterally.   MUSCULOSKELETAL:  With the patient in the standing position, she has paraspinous muscle tenderness in the lumbar region bilaterally.  She does have pain on extension, greater with right lateral rotation as compared to left.  The paraspinous muscle tenderness is worse on the right lumbar region, L4 to L5 region.  With the patient in the supine position, she has a positive straight leg raise which does produce a recurrence of back pain, worse on the right side than the left side.  Her strength appears to be well-preserved at 5 over 5 both proximal and distal to the lower extremities.  Sensation appears to be grossly intact.   ASSESSMENT: 1.  Bilateral facet arthropathy, right greater than left.  2.  Lumbar degenerative disk disease most notably at L5 to S1 with neural foraminal encroachment bilaterally.   PLAN:  At  this point, I am going to schedule her for an epidural steroid at the next available date and we are hoping to be able to do this tomorrow.  We are also going to check her platelet count in advance of this.  Her last platelet count back in 2013 was 390,000 or so.   I have talked to her about the need for weight loss and back stretching strengthening exercises and ultimately she  may need some physical therapy as well.     ____________________________ Alvina Filbert. Andree Elk, MD jga:ea D: 02/17/2013 16:50:22 ET T: 02/17/2013 17:36:42 ET JOB#: [id number]  cc: Alvina Filbert. Andree Elk, MD, <Dictator> Isla Pence, MD Alvina Filbert ADAMS MD ELECTRONICALLY SIGNED 02/26/2013 16:52

## 2014-08-16 ENCOUNTER — Ambulatory Visit (INDEPENDENT_AMBULATORY_CARE_PROVIDER_SITE_OTHER): Payer: 59

## 2014-08-16 ENCOUNTER — Encounter: Payer: Self-pay | Admitting: Podiatry

## 2014-08-16 ENCOUNTER — Ambulatory Visit (INDEPENDENT_AMBULATORY_CARE_PROVIDER_SITE_OTHER): Payer: 59 | Admitting: Podiatry

## 2014-08-16 VITALS — BP 124/80 | HR 73 | Resp 16

## 2014-08-16 DIAGNOSIS — M779 Enthesopathy, unspecified: Secondary | ICD-10-CM | POA: Diagnosis not present

## 2014-08-16 DIAGNOSIS — M778 Other enthesopathies, not elsewhere classified: Secondary | ICD-10-CM

## 2014-08-16 DIAGNOSIS — M7751 Other enthesopathy of right foot: Secondary | ICD-10-CM

## 2014-08-17 NOTE — Progress Notes (Signed)
She presents today complaining of pain to her right dorsal lateral aspect of her right foot. Since his finger for quite some time and seems to be getting worse.  Objective: Vital signs are stable she is alert and oriented 3 no changes in her past medical history medications or allergies. Pulses are palpable right foot. She has pain on final plane range of motion of the Lisfranc's joints as well as pain with direct palpation of the fourth and fifth metatarsocuboid articulation.  Assessment: Capsulitis lateral compensatory syndrome fourth and fifth metatarsocuboid articulation right foot.  Plan: Injected today with Kenalog and local anesthetic after sterile Betadine skin prep. Follow up with her in 1 month.

## 2014-10-19 ENCOUNTER — Other Ambulatory Visit: Payer: Self-pay | Admitting: Internal Medicine

## 2014-10-19 DIAGNOSIS — N644 Mastodynia: Secondary | ICD-10-CM

## 2014-10-27 ENCOUNTER — Ambulatory Visit
Admission: RE | Admit: 2014-10-27 | Discharge: 2014-10-27 | Disposition: A | Discharge status: Home / Self Care | Payer: 59 | Source: Ambulatory Visit | Attending: Internal Medicine | Admitting: Internal Medicine

## 2014-10-27 ENCOUNTER — Ambulatory Visit: Payer: Self-pay

## 2014-10-27 DIAGNOSIS — N63 Unspecified lump in breast: Secondary | ICD-10-CM | POA: Insufficient documentation

## 2014-10-27 DIAGNOSIS — N644 Mastodynia: Secondary | ICD-10-CM | POA: Diagnosis present

## 2014-10-27 DIAGNOSIS — N6001 Solitary cyst of right breast: Secondary | ICD-10-CM | POA: Diagnosis not present

## 2014-11-01 ENCOUNTER — Other Ambulatory Visit: Payer: Self-pay | Admitting: Internal Medicine

## 2014-11-01 DIAGNOSIS — N63 Unspecified lump in unspecified breast: Secondary | ICD-10-CM

## 2014-11-01 DIAGNOSIS — R928 Other abnormal and inconclusive findings on diagnostic imaging of breast: Secondary | ICD-10-CM

## 2014-11-02 ENCOUNTER — Other Ambulatory Visit: Payer: Self-pay | Admitting: Internal Medicine

## 2014-11-02 ENCOUNTER — Ambulatory Visit
Admission: RE | Admit: 2014-11-02 | Discharge: 2014-11-02 | Disposition: A | Discharge status: Home / Self Care | Payer: 59 | Source: Ambulatory Visit | Attending: Internal Medicine | Admitting: Internal Medicine

## 2014-11-02 DIAGNOSIS — N641 Fat necrosis of breast: Secondary | ICD-10-CM | POA: Insufficient documentation

## 2014-11-02 DIAGNOSIS — N63 Unspecified lump in unspecified breast: Secondary | ICD-10-CM

## 2014-11-02 DIAGNOSIS — N6031 Fibrosclerosis of right breast: Secondary | ICD-10-CM | POA: Insufficient documentation

## 2014-11-02 DIAGNOSIS — R928 Other abnormal and inconclusive findings on diagnostic imaging of breast: Secondary | ICD-10-CM

## 2014-11-03 HISTORY — PX: BREAST BIOPSY: SHX20

## 2014-11-03 LAB — SURGICAL PATHOLOGY

## 2014-11-16 ENCOUNTER — Ambulatory Visit: Payer: Commercial Managed Care - PPO | Admitting: Podiatry

## 2014-11-24 ENCOUNTER — Ambulatory Visit (INDEPENDENT_AMBULATORY_CARE_PROVIDER_SITE_OTHER): Payer: 59 | Admitting: Podiatry

## 2014-11-24 VITALS — BP 150/89 | HR 67 | Resp 16

## 2014-11-24 DIAGNOSIS — G5781 Other specified mononeuropathies of right lower limb: Secondary | ICD-10-CM

## 2014-11-24 DIAGNOSIS — G5761 Lesion of plantar nerve, right lower limb: Secondary | ICD-10-CM | POA: Diagnosis not present

## 2014-11-24 MED ORDER — TRAMADOL HCL 50 MG PO TABS: 50.0000 mg | ORAL_TABLET | Freq: Three times a day (TID) | ORAL | Status: DC | PRN

## 2014-11-24 NOTE — Progress Notes (Signed)
She presents today for follow-up of her pain to her right foot. She states that the right heel the longer hurts however she still has pain to the lateral aspect of the foot that radiates up the forefoot. She has pain in the forefoot the radiates up her leg and into her toes. She denies any trauma. She is requesting well.  Objective: Vital signs are stable alert and oriented 3. Pulses are palpable. She has pain on palpation to the thirdwith a palpable Mulder's click right foot.  Assessment: Neuroma third interdigital space right foot.  Plan: Injected the third interdigital space today with Kenalog local last that. Wrote a prescription for tramadol. Also set her up for physical therapy. Follow up with her once physical therapy is complete.

## 2015-01-03 ENCOUNTER — Ambulatory Visit (INDEPENDENT_AMBULATORY_CARE_PROVIDER_SITE_OTHER): Payer: 59 | Admitting: Podiatry

## 2015-01-03 ENCOUNTER — Encounter: Payer: Self-pay | Admitting: Podiatry

## 2015-01-03 ENCOUNTER — Telehealth: Payer: Self-pay | Admitting: *Deleted

## 2015-01-03 VITALS — BP 125/88 | HR 74 | Resp 12

## 2015-01-03 DIAGNOSIS — G5761 Lesion of plantar nerve, right lower limb: Secondary | ICD-10-CM

## 2015-01-03 DIAGNOSIS — G5781 Other specified mononeuropathies of right lower limb: Secondary | ICD-10-CM

## 2015-01-03 MED ORDER — TRAMADOL HCL 50 MG PO TABS: 50.0000 mg | ORAL_TABLET | Freq: Three times a day (TID) | ORAL | Status: DC | PRN

## 2015-01-03 MED ORDER — IBUPROFEN 600 MG PO TABS
600.0000 mg | ORAL_TABLET | Freq: Three times a day (TID) | ORAL | Status: DC
Start: 2015-01-03 — End: 2015-07-06

## 2015-01-03 NOTE — Progress Notes (Signed)
Paula Flores presents today with a chief complaint of pain to her right foot. She states that he just seems to be getting worse rather than better if she refers to her right heel and the lateral aspect of the foot and now the forefoot. She states that she has finished physical therapy and can no longer go because it is just too painful for her.  Objective: Vital signs are stable she is alert and oriented 3 she has severe pain on palpation of the plantar fasciitis insertion site of the right heel and on palpation of the fourth and fifth metatarsocuboid articulation.  Assessment: Chronic pain to the right foot secondary to plantar fasciitis and lateral compensatory syndrome now resulting in posterior tibial tendinitis and fifth metatarsal pain.  Plan: Discussed etiology pathology conservative versus surgical therapies. Secondary to failure of all conservative therapies we are going to request an MRI of this right foot. I will follow-up with her once the results are returned.

## 2015-01-03 NOTE — Telephone Encounter (Addendum)
Faxed MRI request to [facility].  Faxed orders for MRI right foot with and without.  Began [employer] with dx G57.61 for MRI with/without contrast [id number], and requires a PEER to PEER [phone] CASE# [id number].  Faxed Dr. Stephenie Acres orders and PEER to PEER confirmation# [id number] expires 02/19/2015, to [phone].

## 2015-01-05 NOTE — Telephone Encounter (Signed)
#   [phone] exp:10/29

## 2015-01-11 ENCOUNTER — Telehealth: Payer: Self-pay | Admitting: *Deleted

## 2015-01-11 DIAGNOSIS — Z01812 Encounter for preprocedural laboratory examination: Secondary | ICD-10-CM

## 2015-01-11 NOTE — Telephone Encounter (Addendum)
Brentt states pt needs a Creatnine lab prior to the MRI, please fax order.  Creatnine serum ordered.  Faxed to [place].

## 2015-01-12 ENCOUNTER — Other Ambulatory Visit
Admission: RE | Admit: 2015-01-12 | Discharge: 2015-01-12 | Disposition: A | Discharge status: Home / Self Care | Payer: 59 | Source: Ambulatory Visit | Attending: Podiatry | Admitting: Podiatry

## 2015-01-12 ENCOUNTER — Ambulatory Visit
Admission: RE | Admit: 2015-01-12 | Discharge: 2015-01-12 | Disposition: A | Discharge status: Home / Self Care | Payer: 59 | Source: Ambulatory Visit | Attending: Podiatry | Admitting: Podiatry

## 2015-01-12 DIAGNOSIS — R600 Localized edema: Secondary | ICD-10-CM | POA: Diagnosis not present

## 2015-01-12 DIAGNOSIS — Z01812 Encounter for preprocedural laboratory examination: Secondary | ICD-10-CM | POA: Diagnosis not present

## 2015-01-12 DIAGNOSIS — G5761 Lesion of plantar nerve, right lower limb: Secondary | ICD-10-CM

## 2015-01-12 LAB — CREATININE, SERUM
Creatinine, Ser: 0.72 mg/dL (ref 0.44–1.00)
GFR calc Af Amer: >60 mL/min (ref >60)
GFR calc non Af Amer: >60 mL/min (ref >60)

## 2015-01-12 MED ORDER — GADOBENATE DIMEGLUMINE 529 MG/ML IV SOLN
20.0000 mL | Freq: Once | INTRAVENOUS | Status: AC | PRN
  Administered 2015-01-12: 20 mL via INTRAVENOUS

## 2015-01-13 ENCOUNTER — Telehealth: Payer: Self-pay | Admitting: *Deleted

## 2015-01-13 NOTE — Telephone Encounter (Deleted)
-----   Message from Garrel Ridgel, [place] sent at 01/12/2015  5:03 PM EDT ----- Please send for an over read and have them concentrate on the dorsal lateral foot.

## 2015-01-13 NOTE — Telephone Encounter (Addendum)
-----   Message from Garrel Ridgel, [place] sent at 01/12/2015  5:03 PM EDT ----- Please send for an over read and have them concentrate on the dorsal lateral foot.  Faxed request for MRI disc copy [id number].  Mailed disc and orders.

## 2015-02-02 ENCOUNTER — Telehealth: Payer: Self-pay | Admitting: *Deleted

## 2015-02-02 ENCOUNTER — Encounter: Payer: Self-pay | Admitting: *Deleted

## 2015-02-02 DIAGNOSIS — M179 Osteoarthritis of knee, unspecified: Secondary | ICD-10-CM | POA: Insufficient documentation

## 2015-02-02 DIAGNOSIS — M1710 Unilateral primary osteoarthritis, unspecified knee: Secondary | ICD-10-CM | POA: Insufficient documentation

## 2015-02-02 NOTE — Telephone Encounter (Signed)
Called pt l/m to call and schedule appt to discuss overread MRI results

## 2015-02-07 ENCOUNTER — Encounter: Payer: Self-pay | Admitting: Podiatry

## 2015-02-07 ENCOUNTER — Ambulatory Visit (INDEPENDENT_AMBULATORY_CARE_PROVIDER_SITE_OTHER): Payer: 59 | Admitting: Podiatry

## 2015-02-07 VITALS — BP 158/86 | HR 69 | Resp 12

## 2015-02-07 DIAGNOSIS — M7751 Other enthesopathy of right foot: Secondary | ICD-10-CM

## 2015-02-07 DIAGNOSIS — M779 Enthesopathy, unspecified: Principal | ICD-10-CM

## 2015-02-07 DIAGNOSIS — M778 Other enthesopathies, not elsewhere classified: Secondary | ICD-10-CM

## 2015-02-07 DIAGNOSIS — M19071 Primary osteoarthritis, right ankle and foot: Secondary | ICD-10-CM

## 2015-02-07 MED ORDER — TRAMADOL HCL 50 MG PO TABS: 50.0000 mg | ORAL_TABLET | Freq: Four times a day (QID) | ORAL | Status: DC | PRN

## 2015-02-08 NOTE — Progress Notes (Signed)
She presents today for her MRI results. She states that her right foot is doing much better at this point. She still has some tenderness in the forefoot where she has a persistent neuroma and some moderate tenderness on the dorsal lateral aspect of the right foot.  Objective: Vital signs are stable she's alert and oriented 3 pulses are palpable. She still has pain on palpation to the third fourth and fifth metatarsal cuneiform cuboid joint. Mild tenderness on palpation of the neuroma third interdigital space right foot. MRI results most significant for osteoarthritic changes/degenerative changes of the third fourth and fifth metatarsal cuneiform cuboid articulation.  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  Plan: Discussed etiology pathology conservative versus surgical therapies. At this point she will continue with her anti-inflammatories and I will follow-up with her on an as-needed basis.

## 2015-05-23 ENCOUNTER — Telehealth: Payer: Self-pay | Admitting: *Deleted

## 2015-05-23 NOTE — Telephone Encounter (Signed)
Faxed request for refill Tramadol.  Refill denied pt needs an appt if continuing to have a problem. Return fax Tramadol denied.

## 2015-07-06 ENCOUNTER — Ambulatory Visit (INDEPENDENT_AMBULATORY_CARE_PROVIDER_SITE_OTHER): Payer: 59 | Admitting: Podiatry

## 2015-07-06 ENCOUNTER — Encounter: Payer: Self-pay | Admitting: Podiatry

## 2015-07-06 VITALS — BP 166/90 | HR 77 | Resp 16

## 2015-07-06 DIAGNOSIS — M779 Enthesopathy, unspecified: Principal | ICD-10-CM

## 2015-07-06 DIAGNOSIS — M7751 Other enthesopathy of right foot: Secondary | ICD-10-CM | POA: Diagnosis not present

## 2015-07-06 DIAGNOSIS — G5761 Lesion of plantar nerve, right lower limb: Secondary | ICD-10-CM

## 2015-07-06 DIAGNOSIS — M19071 Primary osteoarthritis, right ankle and foot: Secondary | ICD-10-CM | POA: Diagnosis not present

## 2015-07-06 DIAGNOSIS — I10 Essential (primary) hypertension: Secondary | ICD-10-CM | POA: Insufficient documentation

## 2015-07-06 DIAGNOSIS — M722 Plantar fascial fibromatosis: Secondary | ICD-10-CM

## 2015-07-06 DIAGNOSIS — M778 Other enthesopathies, not elsewhere classified: Secondary | ICD-10-CM

## 2015-07-06 MED ORDER — TRAMADOL HCL 50 MG PO TABS: 50.0000 mg | ORAL_TABLET | Freq: Two times a day (BID) | ORAL | Status: DC

## 2015-07-06 NOTE — Progress Notes (Signed)
She presents today with a chief complaint of a painful foot right. She states they're really has never gotten any better. She states that it hurts from here as she points to the third interdigital space always back to hear she points to the dorsal lateral aspect of the right foot. She states that her primary for care provider recommended that she receive her tramadol from our practice for her foot.  Objective: Vital signs are stable alert and oriented 3. Pulses are palpable right foot. She does have a history of back pain. She has palpable neuroma third interdigital space of the right foot. With radiating pain along the area which she states hurts.  Assessment: Neuroma third interdigital space right foot.  Plan: Injected today with her first dose of dehydrated alcohol. And I provided her with a prescription of tramadol 50 mg #50 one by mouth twice a day follow-up with her in 3 weeks for her second dose of dehydrated alcohol.

## 2015-07-27 ENCOUNTER — Encounter: Payer: Self-pay | Admitting: Podiatry

## 2015-07-27 ENCOUNTER — Ambulatory Visit (INDEPENDENT_AMBULATORY_CARE_PROVIDER_SITE_OTHER): Payer: 59 | Admitting: Podiatry

## 2015-07-27 VITALS — BP 180/100 | HR 77 | Resp 16

## 2015-07-27 DIAGNOSIS — G5761 Lesion of plantar nerve, right lower limb: Secondary | ICD-10-CM | POA: Diagnosis not present

## 2015-07-27 NOTE — Progress Notes (Signed)
She presents today for follow-up of her neuroma third interdigital space of the right foot. She states that it is approximately 50% better my whole foot feels better.  Objective: Vital signs are stable she is alert and oriented 3 minimal pain on palpation to the neuroma third interdigital space right foot.  Assessment: Well-healing neuroma third interdigital space right foot.  Plan: Injected her second dose of dehydrated alcohol and will follow up with her in 3 weeks.

## 2015-08-17 ENCOUNTER — Ambulatory Visit (INDEPENDENT_AMBULATORY_CARE_PROVIDER_SITE_OTHER): Payer: 59 | Admitting: Podiatry

## 2015-08-17 ENCOUNTER — Encounter: Payer: Self-pay | Admitting: Podiatry

## 2015-08-17 DIAGNOSIS — G5761 Lesion of plantar nerve, right lower limb: Secondary | ICD-10-CM | POA: Diagnosis not present

## 2015-08-17 DIAGNOSIS — M7751 Other enthesopathy of right foot: Secondary | ICD-10-CM | POA: Diagnosis not present

## 2015-08-17 DIAGNOSIS — M778 Other enthesopathies, not elsewhere classified: Secondary | ICD-10-CM

## 2015-08-17 DIAGNOSIS — M779 Enthesopathy, unspecified: Secondary | ICD-10-CM

## 2015-08-17 NOTE — Progress Notes (Signed)
She presents today for her third dose of dehydrated alcohol to the third interspace of the right foot. She states that it is feeling much better. She states that however she is having pain to the fourth and fifth cuboid articulation area she points to this.  Objective: Vital signs are stable alert and oriented 3. Pulses are palpable. She has pain on palpation and range of motion of the fourth fifth met cuboid articulation. She has a palpable Mulder's click to the third interdigital space of the right foot. Much decrease in pain from previous evaluation.  Assessment: Capsulitis dorsolateral aspect of the right foot as well as neuroma third interdigital space right foot.  Plan: Injected third dose of dehydrated alcohol to the third interdigital space of the right foot. And injected cortisone to the dorsal lateral aspect of the right foot overlying the fourth and fifth metatarsocuboid articulation. Follow up with her in 3 weeks.

## 2015-09-07 ENCOUNTER — Ambulatory Visit: Payer: 59 | Admitting: Podiatry

## 2015-09-14 LAB — HM PAP SMEAR: HM Pap smear: NEGATIVE

## 2015-10-05 ENCOUNTER — Encounter
Admission: RE | Admit: 2015-10-05 | Discharge: 2015-10-05 | Disposition: A | Discharge status: Home / Self Care | Payer: 59 | Source: Ambulatory Visit | Attending: Obstetrics and Gynecology | Admitting: Obstetrics and Gynecology

## 2015-10-05 DIAGNOSIS — Z01810 Encounter for preprocedural cardiovascular examination: Secondary | ICD-10-CM | POA: Insufficient documentation

## 2015-10-05 DIAGNOSIS — Z01812 Encounter for preprocedural laboratory examination: Secondary | ICD-10-CM | POA: Diagnosis present

## 2015-10-05 HISTORY — DX: Unspecified osteoarthritis, unspecified site: M19.90

## 2015-10-05 HISTORY — DX: Essential (primary) hypertension: I10

## 2015-10-05 HISTORY — DX: Anemia, unspecified: D64.9

## 2015-10-05 LAB — CBC
HCT: 36.4 % (ref 35.0–47.0)
Hemoglobin: 12 g/dL (ref 12.0–16.0)
MCH: 26.6 pg (ref 26.0–34.0)
MCHC: 32.9 g/dL (ref 32.0–36.0)
MCV: 80.7 fL (ref 80.0–100.0)
Platelets: 389 10*3/uL (ref 150–440)
RBC: 4.51 MIL/uL (ref 3.80–5.20)
RDW: 14 % (ref 11.5–14.5)
WBC: 8.1 10*3/uL (ref 3.6–11.0)

## 2015-10-05 LAB — BASIC METABOLIC PANEL
Anion gap: 9 (ref 5–15)
BUN: 18 mg/dL (ref 6–20)
CO2: 28 mmol/L (ref 22–32)
Calcium: 9.7 mg/dL (ref 8.9–10.3)
Chloride: 99 mmol/L — ABNORMAL LOW (ref 101–111)
Creatinine, Ser: 0.73 mg/dL (ref 0.44–1.00)
GFR calc Af Amer: >60 mL/min (ref >60)
GFR calc non Af Amer: >60 mL/min (ref >60)
Glucose, Bld: 109 mg/dL — ABNORMAL HIGH (ref 65–99)
Potassium: 3.1 mmol/L — ABNORMAL LOW (ref 3.5–5.1)
Sodium: 136 mmol/L (ref 135–145)

## 2015-10-06 NOTE — Pre-Procedure Instructions (Signed)
FAXED LABS WITH Kt 3.1 AND NOTIFIED [place]

## 2015-10-07 LAB — TYPE AND SCREEN
ABO/RH(D): A POS
Antibody Screen: NEGATIVE

## 2015-10-13 ENCOUNTER — Ambulatory Visit: Payer: 59 | Admitting: Certified Registered Nurse Anesthetist

## 2015-10-13 ENCOUNTER — Encounter: Payer: Self-pay | Admitting: *Deleted

## 2015-10-13 ENCOUNTER — Encounter
Admission: RE | Disposition: A | Discharge status: Home / Self Care | Payer: Self-pay | Source: Ambulatory Visit | Attending: Obstetrics and Gynecology

## 2015-10-13 ENCOUNTER — Ambulatory Visit
Admission: RE | Admit: 2015-10-13 | Discharge: 2015-10-13 | Disposition: A | Discharge status: Home / Self Care | Payer: 59 | Source: Ambulatory Visit | Attending: Obstetrics and Gynecology | Admitting: Obstetrics and Gynecology

## 2015-10-13 DIAGNOSIS — Z79899 Other long term (current) drug therapy: Secondary | ICD-10-CM | POA: Insufficient documentation

## 2015-10-13 DIAGNOSIS — N856 Intrauterine synechiae: Secondary | ICD-10-CM | POA: Diagnosis not present

## 2015-10-13 DIAGNOSIS — Z7984 Long term (current) use of oral hypoglycemic drugs: Secondary | ICD-10-CM | POA: Diagnosis not present

## 2015-10-13 DIAGNOSIS — Z9889 Other specified postprocedural states: Secondary | ICD-10-CM

## 2015-10-13 DIAGNOSIS — N938 Other specified abnormal uterine and vaginal bleeding: Secondary | ICD-10-CM | POA: Insufficient documentation

## 2015-10-13 DIAGNOSIS — M1991 Primary osteoarthritis, unspecified site: Secondary | ICD-10-CM | POA: Diagnosis not present

## 2015-10-13 DIAGNOSIS — E119 Type 2 diabetes mellitus without complications: Secondary | ICD-10-CM | POA: Insufficient documentation

## 2015-10-13 DIAGNOSIS — I10 Essential (primary) hypertension: Secondary | ICD-10-CM | POA: Insufficient documentation

## 2015-10-13 HISTORY — PX: HYSTEROSCOPY WITH D & C: SHX1775

## 2015-10-13 LAB — POCT I-STAT 4, (NA,K, GLUC, HGB,HCT)
Glucose, Bld: 100 mg/dL — ABNORMAL HIGH (ref 65–99)
Glucose, Bld: 99 mg/dL (ref 65–99)
Glucose, Bld: 93 mg/dL (ref 65–99)
HCT: 34 % — ABNORMAL LOW (ref 36.0–46.0)
HCT: 36 % (ref 36.0–46.0)
HCT: 38 % (ref 36.0–46.0)
Hemoglobin: 11.6 g/dL — ABNORMAL LOW (ref 12.0–15.0)
Hemoglobin: 12.2 g/dL (ref 12.0–15.0)
Hemoglobin: 12.9 g/dL (ref 12.0–15.0)
Potassium: 8.5 mmol/L (ref 3.5–5.1)
Potassium: >8.5 mmol/L (ref 3.5–5.1)
Potassium: 3.5 mmol/L (ref 3.5–5.1)
Sodium: 132 mmol/L — ABNORMAL LOW (ref 135–145)
Sodium: 139 mmol/L (ref 135–145)
Sodium: 141 mmol/L (ref 135–145)

## 2015-10-13 LAB — POCT PREGNANCY, URINE: Preg Test, Ur: NEGATIVE

## 2015-10-13 LAB — POTASSIUM: Potassium: 3.4 mmol/L — ABNORMAL LOW (ref 3.5–5.1)

## 2015-10-13 LAB — GLUCOSE, CAPILLARY
Glucose-Capillary: 96 mg/dL (ref 65–99)
Glucose-Capillary: 99 mg/dL (ref 65–99)

## 2015-10-13 SURGERY — DILATATION AND CURETTAGE /HYSTEROSCOPY
Anesthesia: General

## 2015-10-13 MED ORDER — KETOROLAC TROMETHAMINE 30 MG/ML IJ SOLN
INTRAMUSCULAR | Status: DC | PRN
  Administered 2015-10-13: 30 mg via INTRAVENOUS

## 2015-10-13 MED ORDER — PROPOFOL 10 MG/ML IV BOLUS
INTRAVENOUS | Status: DC | PRN
  Administered 2015-10-13: 180 mg via INTRAVENOUS

## 2015-10-13 MED ORDER — FENTANYL CITRATE (PF) 100 MCG/2ML IJ SOLN
INTRAMUSCULAR | Status: AC
  Administered 2015-10-13: 25 ug via INTRAVENOUS
  Filled 2015-10-13: qty 2

## 2015-10-13 MED ORDER — FAMOTIDINE 20 MG PO TABS
ORAL_TABLET | ORAL | Status: AC
  Administered 2015-10-13: 20 mg via ORAL
  Filled 2015-10-13: qty 1

## 2015-10-13 MED ORDER — HYDROCODONE-ACETAMINOPHEN 5-325 MG PO TABS
ORAL_TABLET | ORAL | Status: AC
  Administered 2015-10-13: 1 via ORAL
  Filled 2015-10-13: qty 1

## 2015-10-13 MED ORDER — ONDANSETRON HCL 4 MG/2ML IJ SOLN
INTRAMUSCULAR | Status: DC | PRN
  Administered 2015-10-13: 4 mg via INTRAVENOUS

## 2015-10-13 MED ORDER — GLYCOPYRROLATE 0.2 MG/ML IJ SOLN
INTRAMUSCULAR | Status: DC | PRN
  Administered 2015-10-13: 0.2 mg via INTRAVENOUS

## 2015-10-13 MED ORDER — ONDANSETRON HCL 4 MG/2ML IJ SOLN: 4.0000 mg | Freq: Once | INTRAMUSCULAR | Status: DC | PRN

## 2015-10-13 MED ORDER — MIDAZOLAM HCL 2 MG/2ML IJ SOLN
INTRAMUSCULAR | Status: DC | PRN
  Administered 2015-10-13: 2 mg via INTRAVENOUS

## 2015-10-13 MED ORDER — IBUPROFEN 600 MG PO TABS: 600.0000 mg | ORAL_TABLET | Freq: Four times a day (QID) | ORAL | Status: DC | PRN

## 2015-10-13 MED ORDER — FAMOTIDINE 20 MG PO TABS
20.0000 mg | ORAL_TABLET | Freq: Once | ORAL | Status: AC
  Administered 2015-10-13: 20 mg via ORAL

## 2015-10-13 MED ORDER — FENTANYL CITRATE (PF) 100 MCG/2ML IJ SOLN
25.0000 ug | INTRAMUSCULAR | Status: DC | PRN
  Administered 2015-10-13 (×4): 25 ug via INTRAVENOUS

## 2015-10-13 MED ORDER — HYDROCODONE-ACETAMINOPHEN 5-325 MG PO TABS
1.0000 | ORAL_TABLET | Freq: Four times a day (QID) | ORAL | Status: DC | PRN
  Administered 2015-10-13: 1 via ORAL

## 2015-10-13 MED ORDER — LIDOCAINE HCL (CARDIAC) 20 MG/ML IV SOLN
INTRAVENOUS | Status: DC | PRN
  Administered 2015-10-13: 100 mg via INTRAVENOUS

## 2015-10-13 MED ORDER — SODIUM CHLORIDE 0.9 % IV SOLN
INTRAVENOUS | Status: DC
  Administered 2015-10-13: 11:00:00 via INTRAVENOUS

## 2015-10-13 MED ORDER — HYDROCODONE-ACETAMINOPHEN 5-325 MG PO TABS: 1.0000 | ORAL_TABLET | Freq: Four times a day (QID) | ORAL | Status: DC | PRN

## 2015-10-13 MED ORDER — FENTANYL CITRATE (PF) 100 MCG/2ML IJ SOLN
INTRAMUSCULAR | Status: DC | PRN
  Administered 2015-10-13 (×2): 50 ug via INTRAVENOUS

## 2015-10-13 MED ORDER — SUCCINYLCHOLINE CHLORIDE 20 MG/ML IJ SOLN
INTRAMUSCULAR | Status: DC | PRN
Start: 2015-10-13 — End: 2015-10-13
  Administered 2015-10-13: 120 mg via INTRAVENOUS

## 2015-10-13 SURGICAL SUPPLY — 15 items
ABLATOR ENDOMETRIAL MYOSURE (ABLATOR) ×2 IMPLANT
CATH ROBINSON RED A/P 16FR (CATHETERS) ×2 IMPLANT
ELECT REM PT RETURN 9FT ADLT (ELECTROSURGICAL) ×2
ELECTRODE REM PT RTRN 9FT ADLT (ELECTROSURGICAL) ×1 IMPLANT
GLOVE BIO SURGEON STRL SZ7 (GLOVE) ×4 IMPLANT
GLOVE INDICATOR 7.5 STRL GRN (GLOVE) ×4 IMPLANT
GOWN STRL REUS W/ TWL LRG LVL3 (GOWN DISPOSABLE) ×2 IMPLANT
GOWN STRL REUS W/TWL LRG LVL3 (GOWN DISPOSABLE) ×2
IV LACTATED RINGERS 1000ML (IV SOLUTION) ×4 IMPLANT
KIT RM TURNOVER CYSTO AR (KITS) ×2 IMPLANT
PACK DNC HYST (MISCELLANEOUS) ×2 IMPLANT
PAD OB MATERNITY 4.3X12.25 (PERSONAL CARE ITEMS) ×2 IMPLANT
PAD PREP 24X41 OB/GYN DISP (PERSONAL CARE ITEMS) ×2 IMPLANT
TOWEL OR 17X26 4PK STRL BLUE (TOWEL DISPOSABLE) ×2 IMPLANT
TUBING CONNECTING 10 (TUBING) ×2 IMPLANT

## 2015-10-13 NOTE — OR Nursing (Signed)
Discussed discharged instructions in room with pt, husband and interpreter. Pt and husband understanding and copy of discharge instructions given to them to take home.(english and spanish).

## 2015-10-13 NOTE — Anesthesia Procedure Notes (Signed)
Procedure Name: Intubation Performed by: Lakynn Halvorsen Pre-anesthesia Checklist: Patient identified, Patient being monitored, Timeout performed, Emergency Drugs available and Suction available Patient Re-evaluated:Patient Re-evaluated prior to inductionOxygen Delivery Method: Circle system utilized Preoxygenation: Pre-oxygenation with 100% oxygen Intubation Type: IV induction Ventilation: Mask ventilation without difficulty Laryngoscope Size: Mac and 3 Grade View: Grade I Tube type: Oral Tube size: 7.0 mm Number of attempts: 1 Airway Equipment and Method: Stylet Placement Confirmation: ETT inserted through vocal cords under direct vision,  positive ETCO2 and breath sounds checked- equal and bilateral Secured at: 21 cm Tube secured with: Tape Dental Injury: Teeth and Oropharynx as per pre-operative assessment        

## 2015-10-13 NOTE — Op Note (Signed)
Patient Name: Paula Flores Date of Procedure: 10/13/2015  Preoperative Diagnosis: 1) 50 y.o. with abnormal uterine bleeding 2) History of prior endometrial ablation 3) Endometrial fluid collection on transvaginal ultrasound  Postoperative Diagnosis: 1) 50 y.o. with abnormal uterine bleeding 2) Lower uterine segment stenosis  Operation Performed: Hysteroscopy, dilation and targeted curettage using myosure  Indication: Abnormal uterine bleeding   Anesthesia:  General  Primary Surgeon: Malachy Mood, MD  Assistant: none  Preoperative Antibiotics: none  Estimated Blood Loss: minimal  IV Fluids: 642mL  Urine Output:: ~36mL straight cath  Drains or Tubes: none  Implants: none  Specimens Removed: endometrial curettings  Complications: none  Intraoperative Findings:  Normal parous cervix, on advancing hysteroscope very pinpoint opening was noted at the top of the cervical canal.  Old brown blood was noted oozing from this opening.  This area was to small to advance the hysteroscope through, it was also not definitive as to whether this represented the right tubal ostia or an stricture in the lower uterine segment/cervix.  The myosure device was able to be advanced into this area and using the mysure the surrounding tissue was able to be carefully resected.  This allowed the mysure scope to be advanced and it became apparent that this was in fact a stricture.  Advancing into the cavity the mymetrium appeared well ablated.  The were no other synechia encountered.  The lining was sampled with the myosure.     Patient Condition: stable  Procedure in Detail:  Patient was taken to the operating room were she was administered general endotracheal anesthesia.  She was positioned in the dorsal lithotomy position utilizing Allen stirups, prepped and draped in the usual sterile fashion.   Prior to proceeding with the case a time out was performed.  Attention was turned to the  patient's pelvis.  A red rubber catheter was used to empty the patient's bladder.  An operative speculum was placed to allow visualization of the cervix.  The anterior lip of the cervix was grasped with a single tooth tenaculum and the cervix was sequentially dilated using pratt dilators.  The hysteroscope was then advanced into the uterine cavity encountering a stricture vs the right tubal ostia.  The was enlarged using the mysure device and was in fact found to be a lower uterine segment stricture which then allowed access into the true endometrial cavity.  The cavity otherwise had normal appear postablative changes.  Curettage was performed using the myosure and the resulting specimen collected and sent to pathology.    The single tooth tenaculum was removed from the cervix.  The tenaculum sites and cervix were noted to be  Hemostatic before removing the operative speculum.  Sponge needle and instrument counts were corrects times two.  The patient tolerated the procedure well and was taken to the recovery room in stable condition.

## 2015-10-13 NOTE — Discharge Instructions (Signed)

## 2015-10-13 NOTE — Anesthesia Preprocedure Evaluation (Addendum)
Anesthesia Evaluation  Patient identified by MRN, date of birth, ID band Patient awake    Reviewed: Allergy & Precautions, NPO status , Patient's Chart, lab work & pertinent test results  Airway Mallampati: II  TM Distance: >3 FB     Dental  (+) Chipped   Pulmonary neg pulmonary ROS,    Pulmonary exam normal        Cardiovascular hypertension, Pt. on medications Normal cardiovascular exam     Neuro/Psych negative neurological ROS  negative psych ROS   GI/Hepatic negative GI ROS, Neg liver ROS,   Endo/Other  diabetes, Well Controlled, Type 2, Oral Hypoglycemic Agents  Renal/GU negative Renal ROS     Musculoskeletal  (+) Arthritis , Osteoarthritis,    Abdominal (+) + obese,   Peds negative pediatric ROS (+)  Hematology  (+) anemia ,   Anesthesia Other Findings DM HTN Morbid obesity  Reproductive/Obstetrics                            Anesthesia Physical Anesthesia Plan  ASA: III  Anesthesia Plan: General   Post-op Pain Management:    Induction: Intravenous, Rapid sequence and Cricoid pressure planned  Airway Management Planned: Oral ETT  Additional Equipment:   Intra-op Plan:   Post-operative Plan: Extubation in OR  Informed Consent: I have reviewed the patients History and Physical, chart, labs and discussed the procedure including the risks, benefits and alternatives for the proposed anesthesia with the patient or authorized representative who has indicated his/her understanding and acceptance.   Dental advisory given  Plan Discussed with: CRNA and Surgeon  Anesthesia Plan Comments:         Anesthesia Quick Evaluation

## 2015-10-13 NOTE — Transfer of Care (Signed)
Immediate Anesthesia Transfer of Care Note  Patient: Paula Flores  Procedure(s) Performed: Procedure(s): DILATATION AND CURETTAGE /HYSTEROSCOPY (N/A)  Patient Location: PACU  Anesthesia Type:General  Level of Consciousness: awake and alert   Airway & Oxygen Therapy: Patient Spontanous Breathing and Patient connected to face mask oxygen  Post-op Assessment: Report given to RN and Post -op Vital signs reviewed and stable  Post vital signs: Reviewed and stable  Last Vitals:  Filed Vitals:   10/13/15 1022  BP: 156/102  Pulse: 74  Temp: 36.3 C  Resp: 20    Last Pain:  Filed Vitals:   10/13/15 1040  PainSc: 6          Complications: No apparent anesthesia complications

## 2015-10-13 NOTE — OR Nursing (Signed)
intrepreter Oliver.

## 2015-10-14 LAB — SURGICAL PATHOLOGY

## 2015-10-14 NOTE — Anesthesia Postprocedure Evaluation (Signed)
Anesthesia Post Note  Patient: Paula Flores  Procedure(s) Performed: Procedure(s) (LRB): DILATATION AND CURETTAGE /HYSTEROSCOPY (N/A)  Patient location during evaluation: PACU Anesthesia Type: General Level of consciousness: awake and alert and oriented Pain management: pain level controlled Vital Signs Assessment: post-procedure vital signs reviewed and stable Respiratory status: spontaneous breathing Cardiovascular status: blood pressure returned to baseline Anesthetic complications: no    Last Vitals:  Filed Vitals:   10/13/15 1345 10/13/15 1445  BP: 128/76 141/92  Pulse: 57 62  Temp:    Resp: 16 16    Last Pain:  Filed Vitals:   10/13/15 1448  PainSc: 3                  Ermel Verne

## 2016-05-25 DIAGNOSIS — M25519 Pain in unspecified shoulder: Secondary | ICD-10-CM | POA: Insufficient documentation

## 2016-09-05 ENCOUNTER — Ambulatory Visit (INDEPENDENT_AMBULATORY_CARE_PROVIDER_SITE_OTHER): Payer: 59 | Admitting: Podiatry

## 2016-09-05 ENCOUNTER — Encounter: Payer: Self-pay | Admitting: Podiatry

## 2016-09-05 ENCOUNTER — Ambulatory Visit (INDEPENDENT_AMBULATORY_CARE_PROVIDER_SITE_OTHER): Payer: 59

## 2016-09-05 DIAGNOSIS — M7670 Peroneal tendinitis, unspecified leg: Secondary | ICD-10-CM | POA: Diagnosis not present

## 2016-09-05 DIAGNOSIS — L309 Dermatitis, unspecified: Secondary | ICD-10-CM

## 2016-09-05 DIAGNOSIS — M779 Enthesopathy, unspecified: Secondary | ICD-10-CM

## 2016-09-05 MED ORDER — METHYLPREDNISOLONE 4 MG PO TBPK: ORAL_TABLET | ORAL | 0 refills | Status: DC

## 2016-09-05 MED ORDER — TRIAMCINOLONE ACETONIDE 0.025 % EX CREA: 1.0000 "application " | TOPICAL_CREAM | Freq: Two times a day (BID) | CUTANEOUS | 1 refills | Status: DC

## 2016-09-05 NOTE — Progress Notes (Signed)
She presents today states that her feet are doing very well and then all of a sudden she fell on black ice hurting both feet. She denies fever chills nausea vomiting muscle aches and pains states that there just painful.  Objective: Vital signs are stable she's alert and 3 pulses are palpable pain no ecchymosis or erythema cellulitis drainage or odor. She has pain on palpation of the peroneus longus as it courses beneath the cuboid arcuate portion.  Radiographs do not demonstrate any type of osseus abnormalities in these areas.  Assessment: Tendinitis. Peroneal longus.  Plan: Injected the bilateral peroneal longus and brevis tendon area today making sure not to inject into the tendons. [place] Medrol Dosepak also her prescription for triamcinolone cream for eczema and itching to the bilateral foot.

## 2016-10-08 ENCOUNTER — Encounter: Payer: Self-pay | Admitting: Podiatry

## 2016-10-08 ENCOUNTER — Ambulatory Visit (INDEPENDENT_AMBULATORY_CARE_PROVIDER_SITE_OTHER): Payer: 59 | Admitting: Podiatry

## 2016-10-08 DIAGNOSIS — M7670 Peroneal tendinitis, unspecified leg: Secondary | ICD-10-CM | POA: Diagnosis not present

## 2016-10-08 NOTE — Progress Notes (Signed)
She presented today for follow-up of her peroneal tendinitis right foot. She states that the left was doing great the right one is still a little bit tender.  Objective: Vital signs are stable she is alert and oriented 3 still has tennis on palpation of the peroneal longus tendinitis courses beneath the cuboid at the arcuate portion.  Assessment peroneus longus tendinitis.  Plan: Injected the area today Kenalog and local anesthetic. She will continue conservative therapies follow up with me in 6 weeks and consider MRI at that time.

## 2016-11-09 ENCOUNTER — Emergency Department: Payer: BLUE CROSS/BLUE SHIELD

## 2016-11-09 ENCOUNTER — Emergency Department
Admission: EM | Admit: 2016-11-09 | Discharge: 2016-11-09 | Disposition: A | Discharge status: Home / Self Care | Payer: BLUE CROSS/BLUE SHIELD | Attending: Emergency Medicine | Admitting: Emergency Medicine

## 2016-11-09 ENCOUNTER — Encounter: Payer: Self-pay | Admitting: Emergency Medicine

## 2016-11-09 DIAGNOSIS — Z7984 Long term (current) use of oral hypoglycemic drugs: Secondary | ICD-10-CM | POA: Diagnosis not present

## 2016-11-09 DIAGNOSIS — E876 Hypokalemia: Secondary | ICD-10-CM | POA: Diagnosis not present

## 2016-11-09 DIAGNOSIS — E119 Type 2 diabetes mellitus without complications: Secondary | ICD-10-CM | POA: Insufficient documentation

## 2016-11-09 DIAGNOSIS — T50905A Adverse effect of unspecified drugs, medicaments and biological substances, initial encounter: Secondary | ICD-10-CM

## 2016-11-09 DIAGNOSIS — R42 Dizziness and giddiness: Secondary | ICD-10-CM | POA: Diagnosis present

## 2016-11-09 DIAGNOSIS — Z79899 Other long term (current) drug therapy: Secondary | ICD-10-CM | POA: Diagnosis not present

## 2016-11-09 LAB — BASIC METABOLIC PANEL
Anion gap: 7 (ref 5–15)
BUN: 25 mg/dL — ABNORMAL HIGH (ref 6–20)
CO2: 26 mmol/L (ref 22–32)
Calcium: 9.1 mg/dL (ref 8.9–10.3)
Chloride: 102 mmol/L (ref 101–111)
Creatinine, Ser: 0.99 mg/dL (ref 0.44–1.00)
GFR calc Af Amer: >60 mL/min (ref >60)
GFR calc non Af Amer: >60 mL/min (ref >60)
Glucose, Bld: 186 mg/dL — ABNORMAL HIGH (ref 65–99)
Potassium: 2.8 mmol/L — ABNORMAL LOW (ref 3.5–5.1)
Sodium: 135 mmol/L (ref 135–145)

## 2016-11-09 LAB — CBC
HCT: 37.9 % (ref 35.0–47.0)
Hemoglobin: 12.9 g/dL (ref 12.0–16.0)
MCH: 27.3 pg (ref 26.0–34.0)
MCHC: 34 g/dL (ref 32.0–36.0)
MCV: 80.3 fL (ref 80.0–100.0)
Platelets: 396 10*3/uL (ref 150–440)
RBC: 4.72 MIL/uL (ref 3.80–5.20)
RDW: 13.8 % (ref 11.5–14.5)
WBC: 8.9 10*3/uL (ref 3.6–11.0)

## 2016-11-09 LAB — MAGNESIUM: Magnesium: 1.8 mg/dL (ref 1.7–2.4)

## 2016-11-09 MED ORDER — POTASSIUM CHLORIDE ER 10 MEQ PO TBCR: 20.0000 meq | EXTENDED_RELEASE_TABLET | Freq: Every day | ORAL | 0 refills | Status: DC

## 2016-11-09 MED ORDER — POTASSIUM CHLORIDE CRYS ER 20 MEQ PO TBCR
40.0000 meq | EXTENDED_RELEASE_TABLET | Freq: Once | ORAL | Status: AC
  Administered 2016-11-09: 40 meq via ORAL
  Filled 2016-11-09: qty 2

## 2016-11-09 NOTE — ED Triage Notes (Signed)
Pt reports started taking topimirate and meloxicam last week but stopped them because they made her feel bad. Pt reports started rosuvastation this week. Pt reports feeling very dizzy, feeling her fingers tingle, and having a headache. Pt reports she feels short of breath. Pt moaning, and breathing heavy in triage.

## 2016-11-09 NOTE — Discharge Instructions (Signed)
Stop taking a statin medication, take the potassium supplements, and follow closely with your doctor. Return to the emergency room for any new or worrisome symptoms.

## 2016-11-09 NOTE — ED Provider Notes (Addendum)
[facility] Emergency Department Provider Note  ____________________________________________   I have reviewed the triage vital signs and the nursing notes.   HISTORY  Chief Complaint Medication Reaction and Dizziness    HPI Paula Flores is a 51 y.o. female who started her statin medication 3 days ago and states that since that time she's felt very tingly and unwell generally. She states that she became very very anxious tonight about this and came in. She feels somewhat lightheaded. She denies any chest pain shows breath nausea or vomiting at this time she has actually no symptoms. She states it was while she was thinking about how she felt generally tingly after taking her medication that she had an anxiety reaction she states. She denies any ongoing symptoms. She states that she did feel lightheaded but no longer does. She would like to go home but she does agree to a limited workup.      Past Medical History:  Diagnosis Date  . Anemia   . Arthritis   . Diabetes mellitus without complication ([place])   . Hypertension   . Mastitis 2014    Patient Active Problem List   Diagnosis Date Noted  . BP (high blood pressure) 07/06/2015  . 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 Lump or mass in breast 10/27/2012  . Mastitis 10/27/2012    Past Surgical History:  Procedure Laterality Date  . ABLATION ON ENDOMETRIOSIS    . CESAREAN SECTION    . CHOLECYSTECTOMY    . FOOT SURGERY Right   . HYSTEROSCOPY W/D&C N/A 10/13/2015   Procedure: DILATATION AND CURETTAGE /HYSTEROSCOPY;  Surgeon: Malachy Mood, MD;  Location: [facility] ORS;  Service: Gynecology;  Laterality: N/A;  . TONSILLECTOMY      Prior to Admission medications   Medication Sig Start Date End Date Taking? Authorizing Provider  HYDROcodone-acetaminophen (NORCO/VICODIN) 5-325 MG tablet Take 1 tablet by mouth every 6 (six) hours as needed. 10/13/15   Malachy Mood, MD  ibuprofen  (ADVIL,MOTRIN) 600 MG tablet Take 1 tablet (600 mg total) by mouth every 6 (six) hours as needed. 10/13/15   Malachy Mood, MD  losartan-hydrochlorothiazide (HYZAAR) 100-25 MG tablet Take by mouth.    [provider]  metFORMIN (GLUCOPHAGE) 500 MG tablet Take by mouth 2 (two) times daily with a meal.    [provider]  metoprolol tartrate (LOPRESSOR) 25 MG tablet Take 50 mg by mouth 2 (two) times daily.     [provider]  Misc Natural Products (GLUCOSAMINE CHOND COMPLEX/MSM PO) Take by mouth 3 (three) times daily.    [provider]  OVER THE COUNTER MEDICATION 2 day diet pills herbal takes bid    [provider]  potassium chloride (K-DUR) 10 MEQ tablet Take 2 tablets (20 mEq total) by mouth daily. 11/09/16   Schuyler Amor, MD  triamcinolone (KENALOG) 0.025 % cream Apply 1 application topically 2 (two) times daily. 09/05/16   Hyatt, Max T, DPM  VITAMIN A PO Take by mouth. [employer] vitamin    [provider]    Allergies Penicillins  No family history on file.  Social History Social History  Substance Use Topics  . Smoking status: Never Smoker  . Smokeless tobacco: Never Used  . Alcohol use Yes     Comment: OCCASIONALLY    Review of Systems Constitutional: No fever/chills Eyes: No visual changes. ENT: No sore throat. No stiff neck no neck pain Cardiovascular: Denies chest pain. Respiratory: Denies shortness of breath.  Gastrointestinal:   no vomiting.  No diarrhea.  No constipation. Genitourinary: Negative for dysuria. Musculoskeletal: Negative lower extremity swelling Skin: Negative for rash. Neurological: Negative for severe headaches, focal weakness or numbness.   ____________________________________________   PHYSICAL EXAM:  VITAL SIGNS: ED Triage Vitals  Enc Vitals Group     BP 11/09/16 1631 (!) 175/113     Pulse Rate 11/09/16 1631 (!) 109     Resp 11/09/16 1631 (!) 24     Temp 11/09/16 1631 98.4 F  (36.9 C)     Temp Source 11/09/16 1631 Oral     SpO2 11/09/16 1631 100 %     Weight 11/09/16 1634 (!) 310 lb (140.6 kg)     Height 11/09/16 1634 5\' 6"  (1.676 m)     Head Circumference --      Peak Flow --      Pain Score 11/09/16 2041 6     Pain Loc --      Pain Edu? --      Excl. in [place]? --     Constitutional: Alert and Anxious. Well appearing and in no acute distress. Eyes: Conjunctivae are normal Head: Atraumatic HEENT: No congestion/rhinnorhea. Mucous membranes are moist.  Oropharynx non-erythematous Neck:   Nontender with no meningismus, no masses, no stridor Cardiovascular: Normal rate, regular rhythm. Grossly normal heart sounds.  Good peripheral circulation. Respiratory: Normal respiratory effort.  No retractions. Lungs CTAB. Abdominal: Soft and nontender. No distention. No guarding no rebound Back:  There is no focal tenderness or step off.  there is no midline tenderness there are no lesions noted. there is no CVA tenderness Musculoskeletal: No lower extremity tenderness, no upper extremity tenderness. No joint effusions, no DVT signs strong distal pulses no edema Neurologic:  Normal speech and language. No gross focal neurologic deficits are appreciated.  Skin:  Skin is warm, dry and intact. No rash noted. Psychiatric: Mood and affect are normal. Speech and behavior are normal.  ____________________________________________   LABS (all labs ordered are listed, but only abnormal results are displayed)  Labs Reviewed  BASIC METABOLIC PANEL - Abnormal; Notable for the following:       Result Value   Potassium 2.8 (*)    Glucose, Bld 186 (*)    BUN 25 (*)    All other components within normal limits  CBC  MAGNESIUM  URINALYSIS, COMPLETE (UACMP) WITH MICROSCOPIC   ____________________________________________  EKG  I personally interpreted any EKGs ordered by me or triage Normal sinus rhythm, rate 96 bpm, no acute ST elevation or depression normal axis unremarkable  EKG ____________________________________________  RADIOLOGY  I reviewed any imaging ordered by me or triage that were performed during my shift and, if possible, patient and/or family made aware of any abnormal findings. ____________________________________________   PROCEDURES  Procedure(s) performed: None  Procedures  Critical Care performed: None  ____________________________________________   INITIAL IMPRESSION / ASSESSMENT AND PLAN / ED COURSE  Pertinent labs & imaging results that were available during my care of the patient were reviewed by me and considered in my medical decision making (see chart for details).  Patient here after taking her statin medication which she states makes her feel anxious and unwell. Workup is very reassuring here. She declined any further workup and demanded discharge. She states that if she wasn't discharged immediately she would leave because she felt fine and did not wish to be here anymore. Her magnesium is fine but low and we have sheeted supplementation. Is unclear patient's having  a reaction to the statin or some other issue today. No evidence of ACS PE or dissection no evidence of significant abnormality aside from hypokalemia. We did offer her admission to the hospital but she refuses. We will therefore have her repeat this is actually not a unusual potassium reading for this lab these days after recent change in coloration. In any event, she has no ongoing symptoms no complaints normal exam and aside from potassium, patient's in no acute distress and eager to go home. Return precaution  given and understood and she is very comfortable with discharge. I did advise her to stop taking her statin    ____________________________________________   FINAL CLINICAL IMPRESSION(S) / ED DIAGNOSES  Final diagnoses:  Hypokalemia  Adverse effect of drug, initial encounter      This chart was dictated using voice recognition software.  Despite  best efforts to proofread,  errors can occur which can change meaning.      Schuyler Amor, MD 11/09/16 4287    Schuyler Amor, MD 11/09/16 [phone]

## 2016-11-13 ENCOUNTER — Other Ambulatory Visit: Payer: Self-pay | Admitting: Internal Medicine

## 2016-11-13 DIAGNOSIS — R42 Dizziness and giddiness: Secondary | ICD-10-CM

## 2016-11-14 ENCOUNTER — Ambulatory Visit
Admission: RE | Admit: 2016-11-14 | Discharge: 2016-11-14 | Disposition: A | Discharge status: Home / Self Care | Payer: BLUE CROSS/BLUE SHIELD | Source: Ambulatory Visit | Attending: Internal Medicine | Admitting: Internal Medicine

## 2016-11-14 DIAGNOSIS — I771 Stricture of artery: Secondary | ICD-10-CM | POA: Insufficient documentation

## 2016-11-14 DIAGNOSIS — R42 Dizziness and giddiness: Secondary | ICD-10-CM | POA: Diagnosis present

## 2016-11-19 ENCOUNTER — Ambulatory Visit: Payer: 59 | Admitting: Podiatry

## 2016-11-26 ENCOUNTER — Ambulatory Visit: Payer: 59 | Admitting: Obstetrics and Gynecology

## 2017-01-15 ENCOUNTER — Ambulatory Visit (INDEPENDENT_AMBULATORY_CARE_PROVIDER_SITE_OTHER): Payer: BLUE CROSS/BLUE SHIELD | Admitting: Obstetrics and Gynecology

## 2017-01-15 ENCOUNTER — Encounter: Payer: Self-pay | Admitting: Obstetrics and Gynecology

## 2017-01-15 VITALS — BP 130/88 | HR 81 | Ht 66.0 in | Wt 316.0 lb

## 2017-01-15 DIAGNOSIS — Z124 Encounter for screening for malignant neoplasm of cervix: Secondary | ICD-10-CM | POA: Diagnosis not present

## 2017-01-15 DIAGNOSIS — Q386 Other congenital malformations of mouth: Secondary | ICD-10-CM | POA: Diagnosis not present

## 2017-01-15 DIAGNOSIS — Z01419 Encounter for gynecological examination (general) (routine) without abnormal findings: Secondary | ICD-10-CM

## 2017-01-15 DIAGNOSIS — Z1231 Encounter for screening mammogram for malignant neoplasm of breast: Secondary | ICD-10-CM

## 2017-01-15 DIAGNOSIS — B3731 Acute candidiasis of vulva and vagina: Secondary | ICD-10-CM

## 2017-01-15 DIAGNOSIS — B373 Candidiasis of vulva and vagina: Secondary | ICD-10-CM | POA: Diagnosis not present

## 2017-01-15 DIAGNOSIS — Z1239 Encounter for other screening for malignant neoplasm of breast: Secondary | ICD-10-CM

## 2017-01-15 DIAGNOSIS — Z1211 Encounter for screening for malignant neoplasm of colon: Secondary | ICD-10-CM | POA: Diagnosis not present

## 2017-01-15 MED ORDER — TERCONAZOLE 0.4 % VA CREA: 1.0000 | TOPICAL_CREAM | Freq: Every day | VAGINAL | 1 refills | Status: DC

## 2017-01-15 MED ORDER — FLUCONAZOLE 150 MG PO TABS: 150.0000 mg | ORAL_TABLET | Freq: Once | ORAL | 3 refills | Status: AC

## 2017-01-15 NOTE — Progress Notes (Signed)
Patient ID: Paula Flores, female   DOB: 18-Feb-1966, 51 y.o.   MRN: [id number]     Gynecology Annual Exam  PCP: Casilda Carls, MD  Chief Complaint:  Chief Complaint  Patient presents with  . Gynecologic Exam    irregular bleeding.Marland Kitchenspotting q 3-4 months    History of Present Illness: Patient is a 51 y.o. [id number] presents for annual exam. The patient has no complaints today.   LMP: No LMP recorded. Patient has had an ablation. Menses have spaced out.  She did have significant dysmenorrhea secondary to cervical stenosis and underwent dilation last year.  Since dysmenorrhea has resolved.   She currently has 3-4 light menses every 3-4 months, no intermenstrual spotting.  D&[facility] pathology at that of [facility] last year revealed inactive endometrium, myometrium.  The patient is sexually active. She currently uses tubal ligation for contraception. She denies dyspareunia.  The patient does not perform self breast exams.  There is no notable family history of breast or ovarian cancer in her family.  The patient wears seatbelts: yes.   The patient has regular exercise: not asked.    The patient denies current symptoms of depression.    Review of Systems: Review of Systems  Constitutional: Negative for chills and fever.  HENT: Negative for congestion.   Respiratory: Negative for cough and shortness of breath.   Cardiovascular: Negative for chest pain and palpitations.  Gastrointestinal: Negative for abdominal pain, constipation, diarrhea, heartburn, nausea and vomiting.  Genitourinary: Negative for dysuria, frequency and urgency.  Skin: Negative for itching and rash.  Neurological: Negative for dizziness and headaches.  Endo/Heme/Allergies: Negative for polydipsia.  Psychiatric/Behavioral: Negative for depression.    Past Medical History:  Past Medical History:  Diagnosis Date  . Anemia   . Arthritis   . Bacterial infection due to H. pylori   . Diabetes mellitus without complication ([place])    . Hypertension   . Mastitis 2014    Past Surgical History:  Past Surgical History:  Procedure Laterality Date  . ABLATION ON ENDOMETRIOSIS  09/04/2011  . BOWEL RESECTION  1994  . BREAST BIOPSY  11/03/2014  . CESAREAN SECTION    . CHOLECYSTECTOMY  1994   had bowel resection  . FOOT SURGERY Right   . HYSTEROSCOPY W/D&C N/A 10/13/2015   Procedure: DILATATION AND CURETTAGE /HYSTEROSCOPY;  Surgeon: Malachy Mood, MD;  Location: [facility] ORS;  Service: Gynecology;  Laterality: N/A;  . TONSILLECTOMY    . TUBAL LIGATION  1994    Gynecologic History:  No LMP recorded. Patient has had an ablation. Contraception: tubal ligation Last Pap: Results were: 2017 NIL and HR HPV negative  Last mammogram: 2016Results were: BI-RADS IV biopsy showing fat necrosis  Obstetric History: [id number]  Family History:  Family History  Problem Relation Age of Onset  . Cervical cancer Maternal Grandmother     Social History:  Social History   Social History  . Marital status: Married    Spouse name: N/A  . Number of children: N/A  . Years of education: N/A   Occupational History  . Not on file.   Social History Main Topics  . Smoking status: Never Smoker  . Smokeless tobacco: Never Used  . Alcohol use Yes     Comment: OCCASIONALLY  . Drug use: No  . Sexual activity: Not on file   Other Topics Concern  . Not on file   Social History Narrative  . No narrative on file    Allergies:  Allergies  Allergen  Reactions  . Penicillins Rash    Medications: Prior to Admission medications   Medication Sig Start Date End Date Taking? Authorizing Provider  chlorthalidone (HYGROTON) 25 MG tablet Take 25 mg by mouth daily.   Yes [provider]  etodolac (LODINE) 400 MG tablet Take 400 mg by mouth 2 (two) times daily.   Yes [provider]  losartan-hydrochlorothiazide (HYZAAR) 100-25 MG tablet Take by mouth.   Yes [provider]  metoprolol tartrate (LOPRESSOR) 25 MG  tablet Take 50 mg by mouth 2 (two) times daily.    Yes [provider]    Physical Exam Vitals: Blood pressure 130/88, pulse 81, height 5\' 6"  (1.676 m), weight (!) 316 lb (143.3 kg). Body mass index is 51 kg/m.  General: NAD HEENT: normocephalic, anicteric Thyroid: no enlargement, no palpable nodules Pulmonary: No increased work of breathing, CTAB Cardiovascular: RRR, distal pulses 2+ Breast: Breast symmetrical, no tenderness, no palpable nodules or masses, no skin or nipple retraction present, no nipple discharge.  No axillary or supraclavicular lymphadenopathy. Abdomen: NABS, soft, non-tender, non-distended.  Umbilicus without lesions.  No hepatomegaly, splenomegaly or masses palpable. No evidence of hernia  Genitourinary:  External: Normal external female genitalia.  Normal urethral meatus, normal  Bartholin's and Skene's glands.    Vagina: Normal vaginal mucosa, no evidence of prolapse.    Cervix: Grossly normal in appearance, no bleeding  Uterus: Non-enlarged, mobile, normal contour.  No CMT  Adnexa: ovaries non-enlarged, no adnexal masses  Rectal: deferred  Lymphatic: no evidence of inguinal lymphadenopathy Extremities: no edema, erythema, or tenderness Neurologic: Grossly intact Psychiatric: mood appropriate, affect full  Female chaperone present for pelvic and breast  portions of the physical exam    Assessment: 51 y.o. [id number] routine annual exam  Plan: Problem List Items Addressed This Visit    None    Visit Diagnoses    Breast screening    -  Primary   Relevant Orders   MM DIGITAL SCREENING BILATERAL   Screening for malignant neoplasm of cervix       Special screening for malignant neoplasms, colon       Relevant Orders   Ambulatory referral to Gastroenterology   Encounter for gynecological examination without abnormal finding       Vaginal candida       Relevant Medications   fluconazole (DIFLUCAN) 150 MG tablet   Fordyce spots          1)  Mammogram - recommend yearly screening mammogram.  Mammogram Was ordered today   2) STI screening was offered and declined  3) ASCCP guidelines and rational discussed.  Patient opts for every 3 years screening interval  4) Contraception - s/p BTL  5) Colonoscopy -- Screening recommended starting at age 61 for average risk individuals, age 51 for individuals deemed at increased risk (including African Americans) and recommended to continue until age 65.  For patient age 79-85 individualized approach is recommended.  Gold standard screening is via colonoscopy, Cologuard screening is an acceptable alternative for patient unwilling or unable to undergo colonoscopy.  "Colorectal cancer screening for average?risk adults: 2018 guideline update from the American Cancer Society"CA: A Cancer Journal for Clinicians: Sep 19, 2016  - GI referral   6) Routine healthcare maintenance including cholesterol, diabetes screening discussed managed by PCP   Small fordyce spots labia majora  Rx vaginal yeast

## 2017-01-15 NOTE — Patient Instructions (Signed)
Preventive Care 40-64 Years, Female Preventive care refers to lifestyle choices and visits with your health care provider that can promote health and wellness. What does preventive care include?  A yearly physical exam. This is also called an annual well check.  Dental exams once or twice a year.  Routine eye exams. Ask your health care provider how often you should have your eyes checked.  Personal lifestyle choices, including: ? Daily care of your teeth and gums. ? Regular physical activity. ? Eating a healthy diet. ? Avoiding tobacco and drug use. ? Limiting alcohol use. ? Practicing safe sex. ? Taking low-dose aspirin daily starting at age 58. ? Taking vitamin and mineral supplements as recommended by your health care provider. What happens during an annual well check? The services and screenings done by your health care provider during your annual well check will depend on your age, overall health, lifestyle risk factors, and family history of disease. Counseling Your health care provider may ask you questions about your:  Alcohol use.  Tobacco use.  Drug use.  Emotional well-being.  Home and relationship well-being.  Sexual activity.  Eating habits.  Work and work Statistician.  Method of birth control.  Menstrual cycle.  Pregnancy history.  Screening You may have the following tests or measurements:  Height, weight, and BMI.  Blood pressure.  Lipid and cholesterol levels. These may be checked every 5 years, or more frequently if you are over 81 years old.  Skin check.  Lung cancer screening. You may have this screening every year starting at age 78 if you have a 30-pack-year history of smoking and currently smoke or have quit within the past 15 years.  Fecal occult blood test (FOBT) of the stool. You may have this test every year starting at age 65.  Flexible sigmoidoscopy or colonoscopy. You may have a sigmoidoscopy every 5 years or a colonoscopy  every 10 years starting at age 30.  Hepatitis C blood test.  Hepatitis B blood test.  Sexually transmitted disease (STD) testing.  Diabetes screening. This is done by checking your blood sugar (glucose) after you have not eaten for a while (fasting). You may have this done every 1-3 years.  Mammogram. This may be done every 1-2 years. Talk to your health care provider about when you should start having regular mammograms. This may depend on whether you have a family history of breast cancer.  BRCA-related cancer screening. This may be done if you have a family history of breast, ovarian, tubal, or peritoneal cancers.  Pelvic exam and Pap test. This may be done every 3 years starting at age 80. Starting at age 36, this may be done every 5 years if you have a Pap test in combination with an HPV test.  Bone density scan. This is done to screen for osteoporosis. You may have this scan if you are at high risk for osteoporosis.  Discuss your test results, treatment options, and if necessary, the need for more tests with your health care provider. Vaccines Your health care provider may recommend certain vaccines, such as:  Influenza vaccine. This is recommended every year.  Tetanus, diphtheria, and acellular pertussis (Tdap, Td) vaccine. You may need a Td booster every 10 years.  Varicella vaccine. You may need this if you have not been vaccinated.  Zoster vaccine. You may need this after age 5.  Measles, mumps, and rubella (MMR) vaccine. You may need at least one dose of MMR if you were born in  1957 or later. You may also need a second dose.  Pneumococcal 13-valent conjugate (PCV13) vaccine. You may need this if you have certain conditions and were not previously vaccinated.  Pneumococcal polysaccharide (PPSV23) vaccine. You may need one or two doses if you smoke cigarettes or if you have certain conditions.  Meningococcal vaccine. You may need this if you have certain  conditions.  Hepatitis A vaccine. You may need this if you have certain conditions or if you travel or work in places where you may be exposed to hepatitis A.  Hepatitis B vaccine. You may need this if you have certain conditions or if you travel or work in places where you may be exposed to hepatitis B.  Haemophilus influenzae type b (Hib) vaccine. You may need this if you have certain conditions.  Talk to your health care provider about which screenings and vaccines you need and how often you need them. This information is not intended to replace advice given to you by your health care provider. Make sure you discuss any questions you have with your health care provider. Document Released: 05/06/2015 Document Revised: 12/28/2015 Document Reviewed: 02/08/2015 Elsevier Interactive Patient Education  2017 [employer].

## 2017-02-13 ENCOUNTER — Telehealth: Payer: Self-pay

## 2017-02-13 NOTE — Telephone Encounter (Signed)
Pt saw [facility] a month ago.  Still has itching on the outside of vagina.  Please send in cream. [phone]

## 2017-02-13 NOTE — Telephone Encounter (Signed)
Please advise 

## 2017-02-15 ENCOUNTER — Other Ambulatory Visit: Payer: Self-pay | Admitting: Obstetrics and Gynecology

## 2017-02-15 MED ORDER — TERCONAZOLE 0.4 % VA CREA: 1.0000 | TOPICAL_CREAM | Freq: Every day | VAGINAL | 1 refills | Status: DC

## 2017-02-15 NOTE — Telephone Encounter (Signed)
Refill sent.

## 2017-04-05 ENCOUNTER — Encounter: Payer: Self-pay | Admitting: *Deleted

## 2017-05-08 ENCOUNTER — Encounter: Payer: Self-pay | Admitting: Podiatry

## 2017-05-08 ENCOUNTER — Ambulatory Visit: Payer: BLUE CROSS/BLUE SHIELD | Admitting: Podiatry

## 2017-05-08 DIAGNOSIS — M7670 Peroneal tendinitis, unspecified leg: Secondary | ICD-10-CM

## 2017-05-08 NOTE — Progress Notes (Signed)
She presents today still complaining of peroneal tendinitis of her right lower extremity.  She states the left one is started bothering her to some degree but is no longer an issue at this point.  She states that the right foot is affecting her ability to lose weight as she has gained considerable amount of weight she No longer can exercise and it is hard for her to work.  She states that she is unable to perform her daily activities regularly.  Objective: Signs are stable alert and oriented x3 severe pain on palpation of the peroneus brevis tendon and of the base of the fifth metatarsal of the right foot.  It appears to be boggy with overlying edema.  Assessment: Cannot rule out a tear of the peroneal brevis tendon right.  Plan: Requesting an MRI due to failure of conservative therapies to render her asymptomatic.  We will follow-up with her once the MRI is complete.

## 2017-05-09 NOTE — Addendum Note (Signed)
Addended by: Graceann Congress D on: 05/09/2017 11:58 AM   Modules accepted: Orders

## 2017-05-16 ENCOUNTER — Ambulatory Visit
Admission: RE | Admit: 2017-05-16 | Discharge: 2017-05-16 | Disposition: A | Discharge status: Home / Self Care | Payer: BLUE CROSS/BLUE SHIELD | Source: Ambulatory Visit | Attending: Podiatry | Admitting: Podiatry

## 2017-05-16 DIAGNOSIS — M7731 Calcaneal spur, right foot: Secondary | ICD-10-CM | POA: Insufficient documentation

## 2017-05-16 DIAGNOSIS — M7670 Peroneal tendinitis, unspecified leg: Secondary | ICD-10-CM

## 2017-05-21 ENCOUNTER — Telehealth: Payer: Self-pay | Admitting: *Deleted

## 2017-05-21 NOTE — Telephone Encounter (Signed)
Left message informing pt Dr. Milinda Pointer had reviewed the MRI results and wanted to send the MRI disc copy to a radiology specialist for more indepth reading for treatment planning.

## 2017-05-21 NOTE — Telephone Encounter (Signed)
-----   Message from Garrel Ridgel, [place] sent at 05/20/2017  7:10 AM EST ----- Send for over read

## 2017-05-21 NOTE — Telephone Encounter (Signed)
Faxed request for MRI disc copy to [facility]. 

## 2017-05-21 NOTE — Telephone Encounter (Signed)
Unable to leave a message on pt's work phone, employer requires personal messages to be left on personal phone.

## 2017-05-24 NOTE — Telephone Encounter (Signed)
Mailed copy of disc to [facility].

## 2017-05-28 ENCOUNTER — Encounter: Payer: Self-pay | Admitting: Podiatry

## 2017-05-28 NOTE — Progress Notes (Signed)
Left vm to schedule apt 2/5/19CS @ 454pm

## 2017-06-12 ENCOUNTER — Encounter: Payer: Self-pay | Admitting: Podiatry

## 2017-06-12 ENCOUNTER — Ambulatory Visit: Payer: BLUE CROSS/BLUE SHIELD | Admitting: Podiatry

## 2017-06-12 DIAGNOSIS — M722 Plantar fascial fibromatosis: Secondary | ICD-10-CM | POA: Diagnosis not present

## 2017-06-12 DIAGNOSIS — M7671 Peroneal tendinitis, right leg: Secondary | ICD-10-CM | POA: Diagnosis not present

## 2017-06-12 MED ORDER — LIDOCAINE 5 % EX PTCH: 1.0000 | MEDICATED_PATCH | CUTANEOUS | 0 refills | Status: DC

## 2017-06-12 MED ORDER — ETODOLAC ER 400 MG PO TB24: 400.0000 mg | ORAL_TABLET | Freq: Two times a day (BID) | ORAL | 3 refills | Status: DC

## 2017-06-12 NOTE — Progress Notes (Signed)
She presents today states that her right foot is starting to get worse.  Objective: Vital signs are stable she is alert and oriented x3.  Pulses are palpable.  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  Pulses remain palpable no calf pain.  Assessment: Plantar fasciitis bilateral peroneal tendinitis right.  Plan: At this point I injected all of the sites with 20 mg of Kenalog 5 mg of Marcaine peroneal tendons and plantar fascial bilateral after sterile Betadine skin prep.  I also recommended that we start her on diclofenac 75 mg and we will get her scheduled to see Liliane Channel for  Orthotics.

## 2017-07-10 ENCOUNTER — Ambulatory Visit: Payer: BLUE CROSS/BLUE SHIELD | Admitting: Podiatry

## 2017-07-10 ENCOUNTER — Encounter: Payer: Self-pay | Admitting: Podiatry

## 2017-07-10 DIAGNOSIS — M722 Plantar fascial fibromatosis: Secondary | ICD-10-CM | POA: Diagnosis not present

## 2017-07-10 DIAGNOSIS — G5761 Lesion of plantar nerve, right lower limb: Secondary | ICD-10-CM

## 2017-07-10 NOTE — Progress Notes (Signed)
She presents today states that her second and third toes of her right foot are tingling and she has pain that radiates out into the toes.  She states that the lateral aspect of the foot feels much better.  She is also here to see Liliane Channel today for orthotics.  Objective: Vital signs are stable alert and oriented x3.  Pulses are palpable.  She has pain on palpation to the second interdigital space of the right foot.  No palpable Mulder's click the radiating pain is noted.  Assessment: Probable neuroma second interdigital space right foot residual plantar fasciitis and lateral compensation.  Plan: After sterile Betadine skin prep injected 20 mg of Kenalog 5 mg Marcaine to the point of maximal tenderness between the second and third metatarsal heads after consent.  She was also scanned for orthotics today.

## 2017-08-07 ENCOUNTER — Encounter: Payer: Self-pay | Admitting: Podiatry

## 2017-08-07 ENCOUNTER — Ambulatory Visit (INDEPENDENT_AMBULATORY_CARE_PROVIDER_SITE_OTHER): Payer: BLUE CROSS/BLUE SHIELD | Admitting: Podiatry

## 2017-08-07 DIAGNOSIS — M722 Plantar fascial fibromatosis: Secondary | ICD-10-CM

## 2017-08-07 NOTE — Progress Notes (Signed)
Patient presents to day to pick up custom molded orthotics. Orthotics were dispensed and fit is satisfactory. Written instructions were given and reviewed. She will follow up in 4 weeks for orthotic check.

## 2017-08-07 NOTE — Patient Instructions (Signed)

## 2017-09-05 ENCOUNTER — Other Ambulatory Visit: Payer: Self-pay | Admitting: Podiatry

## 2017-09-11 ENCOUNTER — Encounter: Payer: Self-pay | Admitting: Podiatry

## 2017-09-11 ENCOUNTER — Ambulatory Visit: Payer: BLUE CROSS/BLUE SHIELD | Admitting: Podiatry

## 2017-09-11 DIAGNOSIS — M722 Plantar fascial fibromatosis: Secondary | ICD-10-CM

## 2017-09-11 NOTE — Progress Notes (Signed)
She presents today for follow-up of her orthotics and her plantar fasciitis in her left foot.  She states that the orthotics make her foot and legs very tired.  She still has pain to the left heel.  Objective: Vital signs are stable she is alert and oriented x3.  Pulses are palpable.  She has pain on palpation medial calcaneal tubercle of the left heel.  Orthotics appear to fit very nicely.  Assessment: Plantar fasciitis left.  Plan: Injected the area today 20 mg Kenalog 5 mg Marcaine point maximal tenderness.  Tolerated procedure well without complications.  Recommended highly that she continue to wear the orthotics at all times we will follow-up with me in the near future.

## 2017-11-11 ENCOUNTER — Encounter: Payer: Self-pay | Admitting: Podiatry

## 2017-11-11 ENCOUNTER — Ambulatory Visit: Payer: BLUE CROSS/BLUE SHIELD | Admitting: Podiatry

## 2017-11-11 DIAGNOSIS — M722 Plantar fascial fibromatosis: Secondary | ICD-10-CM

## 2017-11-11 NOTE — Progress Notes (Signed)
She presents today chief complaint of pain to the bilateral heels left greater than right.  Objective: Vital signs are stable she is alert and oriented x3 there is no erythema edema cellulitis drainage or odor.  Pulses are palpable.  His pain on palpation medial calcaneal tubercle of the left heel.  Assessment: Plantar fasciitis left.  Plan: Discussed the need for surgical intervention regarding his left heel but I injected the left heel today after sterile Betadine skin prep 20 mg Kenalog 5 mg Marcaine.  She tolerated procedure well without complication.

## 2017-12-11 ENCOUNTER — Encounter: Payer: Self-pay | Admitting: Podiatry

## 2017-12-11 ENCOUNTER — Ambulatory Visit: Payer: BLUE CROSS/BLUE SHIELD | Admitting: Podiatry

## 2017-12-11 DIAGNOSIS — M778 Other enthesopathies, not elsewhere classified: Secondary | ICD-10-CM

## 2017-12-11 DIAGNOSIS — M779 Enthesopathy, unspecified: Principal | ICD-10-CM

## 2017-12-11 DIAGNOSIS — M7752 Other enthesopathy of left foot: Secondary | ICD-10-CM

## 2017-12-11 MED ORDER — LIDOCAINE 5 % EX PTCH: 1.0000 | MEDICATED_PATCH | CUTANEOUS | 0 refills | Status: DC

## 2017-12-11 NOTE — Progress Notes (Signed)
Paula Flores presents today for follow-up of her left foot.  States that the heel is doing much better however the top of the foot hurts right in here as she refers to the dorsal lateral aspect of the fourth and fifth tarsometatarsal articulation.  Vital signs are stable she is alert and oriented x3.  Pulses are palpable.  She has no pain on palpation medial calcaneal tubercle though she does have pain on palpation at the fourth and fifth tarsometatarsal junction left foot.  No open lesions or wounds are noted.  Assessment: Capsulitis fourth fifth tarsometatarsal area.  Resolution of plantar fasciitis left.  Plan: At this point after sterile Betadine skin prep injected 20 mg Kenalog 5 mg Marcaine point maximal tenderness of her left foot.  Follow-up with her in 2 months.

## 2018-01-02 ENCOUNTER — Ambulatory Visit: Payer: BLUE CROSS/BLUE SHIELD | Admitting: Podiatry

## 2018-01-02 ENCOUNTER — Encounter: Payer: Self-pay | Admitting: Podiatry

## 2018-01-02 DIAGNOSIS — M722 Plantar fascial fibromatosis: Secondary | ICD-10-CM | POA: Diagnosis not present

## 2018-01-02 NOTE — Progress Notes (Signed)
She presents today with continued exquisite pain to the lateral aspect of the left foot.  Objective: Vital signs are stable she is alert and oriented x3 she has some plantar fascial pain but exquisite pain on palpation of the fourth and fifth metatarsal articulation as well as the fifth metatarsal base laterally and plantarly.  Assessment: Cannot rule out a insertional peroneal tendinitis or a stress reaction to the fifth metatarsal base left.  Plan: Requesting an MRI after failure of conservative therapies.

## 2018-01-03 ENCOUNTER — Telehealth: Payer: Self-pay | Admitting: *Deleted

## 2018-01-03 DIAGNOSIS — M25572 Pain in left ankle and joints of left foot: Secondary | ICD-10-CM

## 2018-01-03 DIAGNOSIS — M7671 Peroneal tendinitis, right leg: Secondary | ICD-10-CM

## 2018-01-03 DIAGNOSIS — M7670 Peroneal tendinitis, unspecified leg: Secondary | ICD-10-CM

## 2018-01-03 DIAGNOSIS — M778 Other enthesopathies, not elsewhere classified: Secondary | ICD-10-CM

## 2018-01-03 DIAGNOSIS — M779 Enthesopathy, unspecified: Principal | ICD-10-CM

## 2018-01-03 NOTE — Telephone Encounter (Signed)
Left message on home phone for pt to call for scheduling either [place] or [facility] in [place].

## 2018-01-03 NOTE — Telephone Encounter (Signed)
-----  Message from Rip Harbour, [facility] sent at 01/02/2018 12:13 PM EDT ----- Regarding: MRI MRI foot and ankle left - patient having severe pain lateral side of foot and ankle, include 5th met base in study left - surgical consideration

## 2018-01-03 NOTE — Telephone Encounter (Signed)
Pt states she would like the MRI scheduled in [place]. Faxed order to [facility] and gave to Gretta Arab, RN For pre-cert.

## 2018-01-06 ENCOUNTER — Ambulatory Visit: Payer: BLUE CROSS/BLUE SHIELD | Admitting: Podiatry

## 2018-01-20 ENCOUNTER — Telehealth: Payer: Self-pay | Admitting: Podiatry

## 2018-01-20 NOTE — Telephone Encounter (Signed)
This is [name] calling from the [facility] truck in [place]. Ms. Easler is coming tomorrow for her MRI. I just had a question about her order. Dr. Milinda Pointer had ordered a left foot and left ankle and they both say left ankle pain and just to try to cover some foot. I was calling to see what we could do about that? Could we cover everything under one? If you could call me back at [phone]. Thank you. Bye bye.

## 2018-01-20 NOTE — Telephone Encounter (Signed)
I returned [name]'s call and informed her that he is trying to r/o insertional peroneal tendinitis or  a stress reaction to the fifth metatarsal base, so he is wanting foot and ankle.

## 2018-01-21 ENCOUNTER — Ambulatory Visit
Admission: RE | Admit: 2018-01-21 | Discharge: 2018-01-21 | Disposition: A | Discharge status: Home / Self Care | Payer: BLUE CROSS/BLUE SHIELD | Source: Ambulatory Visit | Attending: Podiatry | Admitting: Podiatry

## 2018-01-21 DIAGNOSIS — M19072 Primary osteoarthritis, left ankle and foot: Secondary | ICD-10-CM | POA: Diagnosis not present

## 2018-01-21 DIAGNOSIS — M25572 Pain in left ankle and joints of left foot: Secondary | ICD-10-CM | POA: Diagnosis present

## 2018-01-23 ENCOUNTER — Telehealth: Payer: Self-pay | Admitting: *Deleted

## 2018-01-23 NOTE — Telephone Encounter (Signed)
Faxed request for copy of MRI disc to [facility]. 

## 2018-01-23 NOTE — Telephone Encounter (Signed)
-----   Message from Garrel Ridgel, [place] sent at 01/23/2018  7:17 AM EDT ----- Please send for over read.

## 2018-01-23 NOTE — Telephone Encounter (Signed)
Left message informing pt of Dr. Stephenie Acres review of results and request to send copy of MRI disc to a radiology specialist for more information for treatment planning, there would be a 2 week delay and once the results were received we would call with instructions.

## 2018-01-23 NOTE — Telephone Encounter (Deleted)
-----   Message from Garrel Ridgel, [place] sent at 01/23/2018  7:16 AM EDT ----- Please send for over read.

## 2018-01-30 NOTE — Telephone Encounter (Signed)
Received copy of MRI disc from [facility]. Mailed copy of MRI disc to [facility].

## 2018-02-04 ENCOUNTER — Encounter: Payer: Self-pay | Admitting: Obstetrics and Gynecology

## 2018-02-04 ENCOUNTER — Other Ambulatory Visit (HOSPITAL_COMMUNITY)
Admission: RE | Admit: 2018-02-04 | Discharge: 2018-02-04 | Disposition: A | Discharge status: Home / Self Care | Payer: BLUE CROSS/BLUE SHIELD | Source: Ambulatory Visit | Attending: Obstetrics and Gynecology | Admitting: Obstetrics and Gynecology

## 2018-02-04 ENCOUNTER — Ambulatory Visit: Payer: BLUE CROSS/BLUE SHIELD | Admitting: Obstetrics and Gynecology

## 2018-02-04 VITALS — BP 142/82 | HR 65 | Ht 66.0 in | Wt 309.0 lb

## 2018-02-04 DIAGNOSIS — N9089 Other specified noninflammatory disorders of vulva and perineum: Secondary | ICD-10-CM | POA: Diagnosis present

## 2018-02-04 DIAGNOSIS — N760 Acute vaginitis: Secondary | ICD-10-CM

## 2018-02-04 DIAGNOSIS — Z1239 Encounter for other screening for malignant neoplasm of breast: Secondary | ICD-10-CM

## 2018-02-04 DIAGNOSIS — Z23 Encounter for immunization: Secondary | ICD-10-CM | POA: Diagnosis not present

## 2018-02-04 DIAGNOSIS — R102 Pelvic and perineal pain unspecified side: Secondary | ICD-10-CM

## 2018-02-04 DIAGNOSIS — N951 Menopausal and female climacteric states: Secondary | ICD-10-CM | POA: Diagnosis not present

## 2018-02-04 MED ORDER — SULFAMETHOXAZOLE-TRIMETHOPRIM 800-160 MG PO TABS: 1.0000 | ORAL_TABLET | Freq: Two times a day (BID) | ORAL | 1 refills | Status: DC

## 2018-02-04 NOTE — Patient Instructions (Signed)
[facility] [address] [place] [address]  [facility] [place]  [address] [place] [address]  Phone: [phone]  

## 2018-02-04 NOTE — Progress Notes (Signed)
Obstetrics & Gynecology Office Visit   Chief Complaint:  Chief Complaint  Patient presents with  . Vaginal Pain    back pain/leg pain x 1 week    History of Present Illness: [name] is a 52 y.o. [id number] who LMP was No LMP recorded. Patient has had an ablation., presents today for a problem visit.   Patient complains of no associated vaginal discharge. Vaginal symptoms include dyspareunia and pain.  No vaginal bleeding or vaginal bleeding. She denies recent antibiotic exposure, denies changes in soaps, detergents coinciding with the onset of her symptoms.  She has previously self treated or been under treatment by another provider for these symptoms. Was seen at [facility] on 02/02/2018.  Urine culture obtained at that time was negative only showing 10,000 CFU of urogenital flora.  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  She has noted a small raised pruritic area on the left lower labia.    Review of Systems: Review of Systems  Constitutional: Negative for chills and fever.  HENT: Negative for congestion.   Respiratory: Negative for cough and shortness of breath.   Cardiovascular: Negative for chest pain and palpitations.  Gastrointestinal: Negative for abdominal pain, constipation, diarrhea, heartburn, nausea and vomiting.  Genitourinary: Negative for dysuria, frequency and urgency.  Skin: Negative for itching and rash.  Neurological: Negative for dizziness and headaches.  Endo/Heme/Allergies: Negative for polydipsia.  Psychiatric/Behavioral: Negative for depression.     Past Medical History:  Past Medical History:  Diagnosis Date  . Anemia   . Arthritis   . Bacterial infection due to H. pylori   . Diabetes mellitus without complication ([place])   . Hypertension   . Mastitis 2014    Past Surgical History:  Past Surgical History:  Procedure Laterality Date  . ABLATION  ON ENDOMETRIOSIS  09/04/2011  . BOWEL RESECTION  1994  . BREAST BIOPSY  11/03/2014  . CESAREAN SECTION    . CHOLECYSTECTOMY  1994   had bowel resection  . FOOT SURGERY Right   . HYSTEROSCOPY W/D&C N/A 10/13/2015   Procedure: DILATATION AND CURETTAGE /HYSTEROSCOPY;  Surgeon: Malachy Mood, MD;  Location: [facility] ORS;  Service: Gynecology;  Laterality: N/A;  . TONSILLECTOMY    . TUBAL LIGATION  1994    Gynecologic History: No LMP recorded. Patient has had an ablation.  Obstetric History: [id number]  Family History:  Family History  Problem Relation Age of Onset  . Cervical cancer Maternal Grandmother     Social History:  Social History   Socioeconomic History  . Marital status: Married    Spouse name: Not on file  . Number of children: Not on file  . Years of education: Not on file  . Highest education level: Not on file  Occupational History  . Not on file  Social Needs  . Financial resource strain: Not on file  . Food insecurity:    Worry: Not on file    Inability: Not on file  . Transportation needs:    Medical: Not on file    Non-medical: Not on file  Tobacco Use  . Smoking status: Never Smoker  . Smokeless tobacco: Never Used  Substance and Sexual Activity  . Alcohol use: Yes    Comment: OCCASIONALLY  . Drug use: No  . Sexual activity: Yes    Birth control/protection: None  Lifestyle  . Physical activity:    Days  per week: Not on file    Minutes per session: Not on file  . Stress: Not on file  Relationships  . Social connections:    Talks on phone: Not on file    Gets together: Not on file    Attends religious service: Not on file    Active member of club or organization: Not on file    Attends meetings of clubs or organizations: Not on file    Relationship status: Not on file  . Intimate partner violence:    Fear of current or ex partner: Not on file    Emotionally abused: Not on file    Physically abused: Not on file    Forced sexual activity: Not  on file  Other Topics Concern  . Not on file  Social History Narrative  . Not on file    Allergies:  Allergies  Allergen Reactions  . Penicillins Rash    Medications: Prior to Admission medications   Medication Sig Start Date End Date Taking? Authorizing Provider  chlorthalidone (HYGROTON) 25 MG tablet Take 25 mg by mouth daily.   Yes [provider]  etodolac (LODINE XL) 400 MG 24 hr tablet TAKE 1 TABLET BY MOUTH TWICE A DAY 09/06/17  Yes Hyatt, Max T, DPM  HYDROcodone-acetaminophen (NORCO/VICODIN) 5-325 MG tablet Take 1 tablet by mouth every 6 (six) hours as needed for moderate pain.   Yes [provider]  losartan-hydrochlorothiazide (HYZAAR) 100-25 MG tablet Take by mouth.   Yes [provider]  metaxalone (SKELAXIN) 800 MG tablet Take 800 mg by mouth 3 (three) times daily. 09/18/17  Yes [provider]  metoprolol tartrate (LOPRESSOR) 25 MG tablet Take 50 mg by mouth 2 (two) times daily.    Yes [provider]    Physical Exam Vitals:  Vitals:   02/04/18 1358  BP: (!) 142/82  Pulse: 65   No LMP recorded. Patient has had an ablation.  General: NAD HEENT: normocephalic, anicteric Pulmonary: No increased work of breathing Genitourinary:  External: Normal external female genitalia.  Normal urethral meatus, normal.  There is a small skin tag where the patient has noted pruritus.   Bartholin's and Skene's glands.    Vagina: Normal vaginal mucosa, no evidence of prolapse.    Cervix: Grossly normal in appearance, no bleeding  Uterus: Non-enlarged, mobile, normal contour.  No CMT  Adnexa: ovaries non-enlarged, no adnexal masses  Rectal: deferred  Lymphatic: no evidence of inguinal lymphadenopathy Extremities: no edema, erythema, or tenderness Neurologic: Grossly intact Psychiatric: mood appropriate, affect full  Female chaperone present for pelvic  portions of the physical exam  VULVAR BIOPSY NOTE The indications for vulvar  biopsy (rule out neoplasia, establish lichen sclerosus diagnosis) were reviewed.   Risks of the biopsy including pain, bleeding, infection, inadequate specimen, scarring and need for additional procedures  were discussed. The patient stated understanding and agreed to undergo procedure today. Consent was signed,  time out performed.  The patient's vulva was prepped with Betadine. 1% lidocaine was injected into the site. An 11 blade scalpel was used to shave of the skin tag. Small bleeding was noted and hemostasis was achieved using silver nitrate sticks.  The patient tolerated the procedure well. Post-procedure instructions  (pelvic rest for one week) were given to the patient. The patient is to call with heavy bleeding, fever greater than 100.4, foul smelling vaginal discharge or other concerns. The patient will be return to clinic in two weeks for discussion of results.  Assessment: 52 y.o. [id number] with pelvic pain, and pruritic skin tag  Plan: Problem List Items Addressed This Visit    None    Visit Diagnoses    Acute vaginitis    -  Primary   Relevant Orders   NuSwab Vaginitis (VG)   Perimenopausal       Relevant Orders   FSH (Completed)   Estradiol (Completed)   Labial lesion       Relevant Orders   Surgical pathology   Pelvic pain       Relevant Orders   US PELVIS TRANSVANGINAL NON-OB (TV ONLY)   Breast screening       Relevant Orders   MM 3D SCREEN BREAST BILATERAL   Caruncle of labium       Flu vaccine need       Relevant Orders   Flu Vaccine QUAD 36+ mos IM (Completed)     1) Pelvic pain - will check to see if any new fluid accumulation in endometrial cavity given prior history of fluid build up and pain in 2017.  Will also check FSH/estradiol to see if patient is close to menoapuse  2) Skin tag - excised today.  Differential benign skin tag, small condyloma.  3) Influenza - vaccination offered and accepted today  4) A total of 15 minutes were spent in face-to-face  contact with the patient during this encounter with over half of that time devoted to counseling and coordination of care.  5) Return in about 1 week (around 02/11/2018) for 1-2 weeks TVUS and follow up.   Malachy Mood, MD, [facility], [place]

## 2018-02-05 ENCOUNTER — Ambulatory Visit: Payer: BLUE CROSS/BLUE SHIELD | Admitting: Podiatry

## 2018-02-05 LAB — FOLLICLE STIMULATING HORMONE: FSH: 28.4 m[IU]/mL

## 2018-02-05 LAB — ESTRADIOL: Estradiol: 62.5 pg/mL

## 2018-02-06 LAB — NUSWAB VAGINITIS (VG)
Candida albicans, NAA: NEGATIVE
Candida glabrata, NAA: NEGATIVE
Trich vag by NAA: NEGATIVE

## 2018-02-14 ENCOUNTER — Ambulatory Visit: Payer: BLUE CROSS/BLUE SHIELD | Admitting: Obstetrics and Gynecology

## 2018-02-14 ENCOUNTER — Ambulatory Visit (INDEPENDENT_AMBULATORY_CARE_PROVIDER_SITE_OTHER): Payer: BLUE CROSS/BLUE SHIELD

## 2018-02-14 ENCOUNTER — Encounter: Payer: Self-pay | Admitting: Obstetrics and Gynecology

## 2018-02-14 VITALS — BP 122/88 | Wt 299.0 lb

## 2018-02-14 DIAGNOSIS — N888 Other specified noninflammatory disorders of cervix uteri: Secondary | ICD-10-CM | POA: Diagnosis not present

## 2018-02-14 DIAGNOSIS — N854 Malposition of uterus: Secondary | ICD-10-CM | POA: Diagnosis not present

## 2018-02-14 DIAGNOSIS — R102 Pelvic and perineal pain: Secondary | ICD-10-CM

## 2018-02-17 ENCOUNTER — Encounter: Payer: Self-pay | Admitting: Podiatry

## 2018-02-17 ENCOUNTER — Telehealth: Payer: Self-pay | Admitting: *Deleted

## 2018-02-17 ENCOUNTER — Ambulatory Visit: Payer: BLUE CROSS/BLUE SHIELD | Admitting: Podiatry

## 2018-02-17 DIAGNOSIS — M778 Other enthesopathies, not elsewhere classified: Secondary | ICD-10-CM

## 2018-02-17 DIAGNOSIS — M722 Plantar fascial fibromatosis: Secondary | ICD-10-CM

## 2018-02-17 DIAGNOSIS — M779 Enthesopathy, unspecified: Secondary | ICD-10-CM

## 2018-02-17 NOTE — Patient Instructions (Signed)
Pre-Operative Instructions  Congratulations, you have decided to take an important step towards improving your quality of life.  You can be assured that the doctors and staff at [facility] will be with you every step of the way.  Here are some important things you should know:  1. Plan to be at the surgery center/hospital at least 1 (one) hour prior to your scheduled time, unless otherwise directed by the surgical center/hospital staff.  You must have a responsible adult accompany you, remain during the surgery and drive you home.  Make sure you have directions to the surgical center/hospital to ensure you arrive on time. 2. If you are having surgery at [facility] or [facility] hospitals, you will need a copy of your medical history and physical form from your family physician within one month prior to the date of surgery. We will give you a form for your primary physician to complete.  3. We make every effort to accommodate the date you request for surgery.  However, there are times where surgery dates or times have to be moved.  We will contact you as soon as possible if a change in schedule is required.   4. No aspirin/ibuprofen for one week before surgery.  If you are on aspirin, any non-steroidal anti-inflammatory medications (Mobic, Aleve, Ibuprofen) should not be taken seven (7) days prior to your surgery.  You make take Tylenol for pain prior to surgery.  5. Medications - If you are taking daily heart and blood pressure medications, seizure, reflux, allergy, asthma, anxiety, pain or diabetes medications, make sure you notify the surgery center/hospital before the day of surgery so they can tell you which medications you should take or avoid the day of surgery. 6. No food or drink after midnight the night before surgery unless directed otherwise by surgical center/hospital staff. 7. No alcoholic beverages 24-hours prior to surgery.  No smoking 24-hours prior or 24-hours after  surgery. 8. Wear loose pants or shorts. They should be loose enough to fit over bandages, boots, and casts. 9. Don't wear slip-on shoes. Sneakers are preferred. 10. Bring your boot with you to the surgery center/hospital.  Also bring crutches or a walker if your physician has prescribed it for you.  If you do not have this equipment, it will be provided for you after surgery. 11. If you have not been contacted by the surgery center/hospital by the day before your surgery, call to confirm the date and time of your surgery. 12. Leave-time from work may vary depending on the type of surgery you have.  Appropriate arrangements should be made prior to surgery with your employer. 13. Prescriptions will be provided immediately following surgery by your doctor.  Fill these as soon as possible after surgery and take the medication as directed. Pain medications will not be refilled on weekends and must be approved by the doctor. 14. Remove nail polish on the operative foot and avoid getting pedicures prior to surgery. 15. Wash the night before surgery.  The night before surgery wash the foot and leg well with water and the antibacterial soap provided. Be sure to pay special attention to beneath the toenails and in between the toes.  Wash for at least three (3) minutes. Rinse thoroughly with water and dry well with a towel.  Perform this wash unless told not to do so by your physician.  Enclosed: 1 Ice pack (please put in freezer the night before surgery)   1 Hibiclens skin cleaner     Pre-op instructions  If you have any questions regarding the instructions, please do not hesitate to call our office.  [place]: [address], [place], [place] [address] -- [phone]  [place]: [address] [place], [place] [address] -- [phone]  [place]: [address]  [place], [place] [address] -- [phone]  [place]: [address], [address], [place], [place] [address] -- [phone]  Website:  [email or web address] 

## 2018-02-17 NOTE — Progress Notes (Signed)
She presents today complaining of pain to the plantar lateral aspect of the left foot.  Objective: Vital signs are stable alert and oriented x3.  Pulses are palpable.  She has pain on palpation medial calcaneal tubercle left heel and dorsal lateral aspect fourth fifth tarsometatarsal area.  MRI does suggest plantar fasciitis.  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  Plan: Discussed etiology pathology conservative or surgical therapies.  At this point she signed a consent form for an endoscopic plantar fasciotomy of the left foot she has had one on the right foot before so she knows what to expect.  We will do this in November before she leaves for her holiday.  We did discuss the possible postop complications which may include but are not limited to postop pain bleeding swelling infection recurrence need for further surgery.  I also injected her today after sterile Betadine skin prep with 20 mg Kenalog 5 mg Marcaine dorsal aspect of the foot and plantar medial plantar fashion calcaneal insertion site.  She tolerated this well.  I will follow-up with her in November for surgery.

## 2018-02-17 NOTE — Telephone Encounter (Signed)
"  I calling to schedule surgery.  I saw Dr. Milinda Pointer today."  I don't have your information yet.  I'll have to call you tomorrow when I get it.  "I like to do it on November 22, that's the date that he said."  I can't say whether I can schedule it that date without seeing your paperwork.  "So you call me tomorrow?"  Yes, I will call you tomorrow.

## 2018-02-18 ENCOUNTER — Encounter: Payer: Self-pay | Admitting: Podiatry

## 2018-02-18 NOTE — Telephone Encounter (Signed)
I attempted to return her call.  I left her a message to call me tomorrow. 

## 2018-02-19 NOTE — Telephone Encounter (Signed)
I am returning your call.  You want to schedule your surgery?  "Yes, I have tried to call you several times today."  Do you have a date in mind that you would like to do it?  "He said I could do it on November 22."  I'll get it scheduled.  Someone from the surgical center will give you a call a day or two prior to your surgical date.  They will give you your arrival time.  You need to go online and register with the surgical center, instructions are in the brochure that I gave you.  "I didn't know I had to go online.  When do I need to do this?"  Whatever is convenient for you.

## 2018-02-19 NOTE — Progress Notes (Signed)
Gynecology Ultrasound Follow Up  Chief Complaint:  Chief Complaint  Patient presents with  . Follow-up    GYN U/S     History of Present Illness: Patient is a 52 y.o. female who presents today for ultrasound evaluation of pelvic pain .  Ultrasound demonstrates the following findgins Adnexa: no masses seen Uterus: Normal contour with normal appearing endometrial stripe, non-thickened, no evidence of fluid colleciton Additional: no free fluid  Review of Systems: Review of Systems  All other systems reviewed and are negative.   Past Medical History:  Past Medical History:  Diagnosis Date  . Anemia   . Arthritis   . Bacterial infection due to H. pylori   . Diabetes mellitus without complication ([place])   . Hypertension   . Mastitis 2014    Past Surgical History:  Past Surgical History:  Procedure Laterality Date  . ABLATION ON ENDOMETRIOSIS  09/04/2011  . BOWEL RESECTION  1994  . BREAST BIOPSY  11/03/2014  . CESAREAN SECTION    . CHOLECYSTECTOMY  1994   had bowel resection  . FOOT SURGERY Right   . HYSTEROSCOPY W/D&C N/A 10/13/2015   Procedure: DILATATION AND CURETTAGE /HYSTEROSCOPY;  Surgeon: Malachy Mood, MD;  Location: [facility] ORS;  Service: Gynecology;  Laterality: N/A;  . TONSILLECTOMY    . TUBAL LIGATION  1994    Gynecologic History:  No LMP recorded. Patient has had an ablation.   Family History:  Family History  Problem Relation Age of Onset  . Cervical cancer Maternal Grandmother     Social History:  Social History   Socioeconomic History  . Marital status: Married    Spouse name: Not on file  . Number of children: Not on file  . Years of education: Not on file  . Highest education level: Not on file  Occupational History  . Not on file  Social Needs  . Financial resource strain: Not on file  . Food insecurity:    Worry: Not on file    Inability: Not on file  . Transportation needs:    Medical: Not on file    Non-medical: Not on file   Tobacco Use  . Smoking status: Never Smoker  . Smokeless tobacco: Never Used  Substance and Sexual Activity  . Alcohol use: Yes    Comment: OCCASIONALLY  . Drug use: No  . Sexual activity: Yes    Birth control/protection: None  Lifestyle  . Physical activity:    Days per week: Not on file    Minutes per session: Not on file  . Stress: Not on file  Relationships  . Social connections:    Talks on phone: Not on file    Gets together: Not on file    Attends religious service: Not on file    Active member of club or organization: Not on file    Attends meetings of clubs or organizations: Not on file    Relationship status: Not on file  . Intimate partner violence:    Fear of current or ex partner: Not on file    Emotionally abused: Not on file    Physically abused: Not on file    Forced sexual activity: Not on file  Other Topics Concern  . Not on file  Social History Narrative  . Not on file    Allergies:  Allergies  Allergen Reactions  . Penicillins Rash    Medications: Prior to Admission medications   Medication Sig Start Date End Date Taking? Authorizing  Provider  chlorthalidone (HYGROTON) 25 MG tablet Take 25 mg by mouth daily.   Yes [provider]  etodolac (LODINE XL) 400 MG 24 hr tablet TAKE 1 TABLET BY MOUTH TWICE A DAY 09/06/17  Yes Hyatt, Max T, DPM  HYDROcodone-acetaminophen (NORCO/VICODIN) 5-325 MG tablet Take 1 tablet by mouth every 6 (six) hours as needed for moderate pain.   Yes [provider]  losartan-hydrochlorothiazide (HYZAAR) 100-25 MG tablet Take by mouth.   Yes [provider]  metaxalone (SKELAXIN) 800 MG tablet Take 800 mg by mouth 3 (three) times daily. 09/18/17  Yes [provider]  metoprolol tartrate (LOPRESSOR) 25 MG tablet Take 50 mg by mouth 2 (two) times daily.    Yes [provider]  sulfamethoxazole-trimethoprim (BACTRIM DS,SEPTRA DS) 800-160 MG tablet Take 1 tablet by mouth 2 (two) times  daily. 02/04/18  Yes Malachy Mood, MD  losartan (COZAAR) 100 MG tablet Take 100 mg by mouth daily. 02/05/18   [provider]  methocarbamol (ROBAXIN) 500 MG tablet Take 500 mg by mouth 2 (two) times daily as needed. 02/07/18   [provider]  Vitamin D, Ergocalciferol, (DRISDOL) 50000 units CAPS capsule TAKE 1 CAPSULE BY MOUTH ONE TIME PER WEEK 01/22/18   [provider]    Physical Exam Vitals: Blood pressure 122/88, weight 299 lb (135.6 kg).  General: NAD HEENT: normocephalic, anicteric Pulmonary: No increased work of breathing Extremities: no edema, erythema, or tenderness Neurologic: Grossly intact, normal gait Psychiatric: mood appropriate, affect full  US Pelvis Transvanginal Non-ob (tv Only)  Result Date: 02/16/2018 ULTRASOUND REPORT Location: [facility] Date of Service: 02/14/2018 Patient Name: Paula Flores DOB: 1965-06-08 MRN: [id number] Indications:Pelvic Pain Findings: The uterus is anteverted and measures 8.42 x 4.72 x 4.19 cm. Echo texture is heterogenous without evidence of focal masses. The Endometrium measures 2.26 mm. Right Ovary measures 2.17 x 1.64 x 1.06 cm. It is normal in appearance. Left Ovary measures 2.00 x 1.23 x 1.37 cm. It is normal in appearance. Survey of the adnexa demonstrates no adnexal masses. There is no free fluid in the cul de sac. Impression: 1. Heterogenous anteverted uterus. 2. Multiple nabothian cyst is present in cervix. Recommendations: 1.Clinical correlation with the patient's History and Physical Exam. Mital bahen Marlowe Sax, RDMS Images reviewed.  Normal GYN study without visualized pathology.  Malachy Mood, MD, [facility], [facility]   Mr Foot Left Wo Contrast  Result Date: 01/21/2018 CLINICAL DATA:  Left foot and ankle pain since a fall on ice in January, 2018. EXAM: MRI OF THE LEFT FOOT WITHOUT CONTRAST TECHNIQUE: Multiplanar, multisequence MR imaging of the left foot was  performed. No intravenous contrast was administered. COMPARISON:  Plain films left foot 09/05/2016. FINDINGS: Bones/Joint/Cartilage There is some joint space narrowing and subchondral cyst formation about the second and third tarsometatarsal joints. Bone marrow signal is otherwise normal without fracture, stress change or worrisome lesion. Ligaments Intact. Muscles and Tendons Intact and normal appearance. Soft tissues Normal. 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 The study is otherwise negative. Electronically Signed   By: Inge Rise M.D.   On: 01/21/2018 12:46   Mr Ankle Left Wo Contrast  Result Date: 01/21/2018 CLINICAL DATA:  Left foot and ankle pain since a fall on ice in January, 2018. EXAM: MRI OF THE LEFT ANKLE WITHOUT CONTRAST TECHNIQUE: Multiplanar, multisequence MR imaging of the ankle was performed. No intravenous contrast was administered. COMPARISON:  None. FINDINGS: TENDONS Peroneal: Intact.  Posteromedial: Intact. Anterior: Intact. Achilles: Intact. Plantar Fascia: Medial cord of the plantar fascia appears thickened but demonstrates normal signal. Plantar calcaneal spur is noted. LIGAMENTS Lateral: Intact. Medial: Intact. CARTILAGE Ankle Joint: Appears normal. Subtalar Joints/Sinus Tarsi: Appear normal. Bones: Vascular remnant in the calcaneus is noted. The patient has second and third tarsometatarsal joint osteoarthritis where there is some joint space narrowing and subchondral cyst formation. Other: None. IMPRESSION: Negative for tendon or ligament tear. 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 Electronically Signed   By: Inge Rise M.D.   On: 01/21/2018 12:48     Assessment: 52 y.o. [id number] pelvic pain follow up  Plan: Problem List Items Addressed This Visit    None      1) Normal gyn ultrasound.  No evidence of hematometria or lfuid build up..  Labs reviewed and both FSH and estradiol in  postmenopausal range.   Patient reports resolution of pain in interim.  2) A total of 15 minutes were spent in face-to-face contact with the patient during this encounter with over half of that time devoted to counseling and coordination of care.  3) No follow-ups on file.    Malachy Mood, MD, [facility], [place]

## 2018-03-11 DIAGNOSIS — M79676 Pain in unspecified toe(s): Secondary | ICD-10-CM

## 2018-03-12 ENCOUNTER — Other Ambulatory Visit: Payer: Self-pay | Admitting: Podiatry

## 2018-03-12 MED ORDER — CLINDAMYCIN HCL 150 MG PO CAPS: 150.0000 mg | ORAL_CAPSULE | Freq: Three times a day (TID) | ORAL | 0 refills | Status: DC

## 2018-03-12 MED ORDER — ONDANSETRON HCL 4 MG PO TABS: 4.0000 mg | ORAL_TABLET | Freq: Three times a day (TID) | ORAL | 0 refills | Status: DC | PRN

## 2018-03-12 MED ORDER — OXYCODONE-ACETAMINOPHEN 10-325 MG PO TABS: 1.0000 | ORAL_TABLET | Freq: Four times a day (QID) | ORAL | 0 refills | Status: AC | PRN

## 2018-03-14 ENCOUNTER — Encounter: Payer: Self-pay | Admitting: Podiatry

## 2018-03-14 DIAGNOSIS — M722 Plantar fascial fibromatosis: Secondary | ICD-10-CM | POA: Diagnosis not present

## 2018-03-17 ENCOUNTER — Other Ambulatory Visit: Payer: Self-pay

## 2018-03-17 ENCOUNTER — Emergency Department: Payer: BLUE CROSS/BLUE SHIELD

## 2018-03-17 ENCOUNTER — Emergency Department
Admission: EM | Admit: 2018-03-17 | Discharge: 2018-03-17 | Disposition: A | Discharge status: Home / Self Care | Payer: BLUE CROSS/BLUE SHIELD | Attending: Emergency Medicine | Admitting: Emergency Medicine

## 2018-03-17 ENCOUNTER — Encounter: Payer: Self-pay | Admitting: Emergency Medicine

## 2018-03-17 DIAGNOSIS — I10 Essential (primary) hypertension: Secondary | ICD-10-CM | POA: Diagnosis not present

## 2018-03-17 DIAGNOSIS — Z79899 Other long term (current) drug therapy: Secondary | ICD-10-CM | POA: Diagnosis not present

## 2018-03-17 DIAGNOSIS — T50905A Adverse effect of unspecified drugs, medicaments and biological substances, initial encounter: Secondary | ICD-10-CM | POA: Diagnosis not present

## 2018-03-17 DIAGNOSIS — R51 Headache: Secondary | ICD-10-CM | POA: Diagnosis present

## 2018-03-17 DIAGNOSIS — E119 Type 2 diabetes mellitus without complications: Secondary | ICD-10-CM | POA: Diagnosis not present

## 2018-03-17 DIAGNOSIS — R5383 Other fatigue: Secondary | ICD-10-CM | POA: Insufficient documentation

## 2018-03-17 LAB — GLUCOSE, CAPILLARY: Glucose-Capillary: 107 mg/dL — ABNORMAL HIGH (ref 70–99)

## 2018-03-17 LAB — BASIC METABOLIC PANEL
Anion gap: 3 — ABNORMAL LOW (ref 5–15)
BUN: 19 mg/dL (ref 6–20)
CO2: 32 mmol/L (ref 22–32)
Calcium: 8.6 mg/dL — ABNORMAL LOW (ref 8.9–10.3)
Chloride: 107 mmol/L (ref 98–111)
Creatinine, Ser: 0.65 mg/dL (ref 0.44–1.00)
GFR calc Af Amer: >60 mL/min (ref >60)
GFR calc non Af Amer: >60 mL/min (ref >60)
Glucose, Bld: 143 mg/dL — ABNORMAL HIGH (ref 70–99)
Potassium: 4.3 mmol/L (ref 3.5–5.1)
Sodium: 142 mmol/L (ref 135–145)

## 2018-03-17 LAB — CBC
HCT: 37.5 % (ref 36.0–46.0)
Hemoglobin: 11.9 g/dL — ABNORMAL LOW (ref 12.0–15.0)
MCH: 27 pg (ref 26.0–34.0)
MCHC: 31.7 g/dL (ref 30.0–36.0)
MCV: 85.2 fL (ref 80.0–100.0)
Platelets: 370 10*3/uL (ref 150–400)
RBC: 4.4 MIL/uL (ref 3.87–5.11)
RDW: 14.3 % (ref 11.5–15.5)
WBC: 9.3 10*3/uL (ref 4.0–10.5)
nRBC: 0 % (ref 0.0–0.2)

## 2018-03-17 LAB — TROPONIN I: Troponin I: <0.03 ng/mL (ref <0.03)

## 2018-03-17 NOTE — ED Provider Notes (Signed)
[facility] Emergency Department Provider Note  ____________________________________________   I have reviewed the triage vital signs and the nursing notes.   HISTORY  Chief Complaint Multiple Complaints   History limited by: Not Limited   HPI Paula Flores is a 52 y.o. female who presents to the emergency department today with multiple complaints.  She states that it all stems from taking opioid pain medication.  She is taken for an orthopedic procedure and states she had her first dose last night and then again this morning.  A few hours after she took her pain medication she started developing headache, fatigue, chest discomfort.  She also noticed that her blood pressure was very high.  She did however state that she did not take her blood pressure medication this morning because she did not want to mix her medications.  By the time of my exam the patient stated that she felt back to normal and that her symptoms were gone.   Per medical record review patient has a history of DM, HTN.  Past Medical History:  Diagnosis Date  . Anemia   . Arthritis   . Bacterial infection due to H. pylori   . Diabetes mellitus without complication ([place])   . Hypertension   . Mastitis 2014    Patient Active Problem List   Diagnosis Date Noted  . BP (high blood pressure) 07/06/2015  . 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 Lump or mass in breast 10/27/2012  . Mastitis 10/27/2012    Past Surgical History:  Procedure Laterality Date  . ABLATION ON ENDOMETRIOSIS  09/04/2011  . BOWEL RESECTION  1994  . BREAST BIOPSY  11/03/2014  . CESAREAN SECTION    . CHOLECYSTECTOMY  1994   had bowel resection  . FOOT SURGERY Right   . HYSTEROSCOPY W/D&C N/A 10/13/2015   Procedure: DILATATION AND CURETTAGE /HYSTEROSCOPY;  Surgeon: Malachy Mood, MD;  Location: [facility] ORS;  Service: Gynecology;  Laterality: N/A;  . TONSILLECTOMY    . TUBAL LIGATION  1994    Prior to  Admission medications   Medication Sig Start Date End Date Taking? Authorizing Provider  chlorthalidone (HYGROTON) 25 MG tablet Take 25 mg by mouth daily.    [provider]  clindamycin (CLEOCIN) 150 MG capsule Take 1 capsule (150 mg total) by mouth 3 (three) times daily. 03/12/18   Hyatt, Max T, DPM  etodolac (LODINE XL) 400 MG 24 hr tablet TAKE 1 TABLET BY MOUTH TWICE A DAY 09/06/17   Hyatt, Max T, DPM  HYDROcodone-acetaminophen (NORCO/VICODIN) 5-325 MG tablet Take 1 tablet by mouth every 6 (six) hours as needed for moderate pain.    [provider]  losartan (COZAAR) 100 MG tablet Take 100 mg by mouth daily. 02/05/18   [provider]  losartan-hydrochlorothiazide (HYZAAR) 100-25 MG tablet Take by mouth.    [provider]  metaxalone (SKELAXIN) 800 MG tablet Take 800 mg by mouth 3 (three) times daily. 09/18/17   [provider]  methocarbamol (ROBAXIN) 500 MG tablet Take 500 mg by mouth 2 (two) times daily as needed. 02/07/18   [provider]  metoprolol tartrate (LOPRESSOR) 25 MG tablet Take 50 mg by mouth 2 (two) times daily.     [provider]  ondansetron (ZOFRAN) 4 MG tablet Take 1 tablet (4 mg total) by mouth every 8 (eight) hours as needed for nausea or vomiting. 03/12/18   Hyatt, Max T, DPM  oxyCODONE-acetaminophen (PERCOCET) 10-325 MG tablet Take 1  tablet by mouth every 6 (six) hours as needed for up to 7 days for pain. 03/12/18 03/19/18  Hyatt, Max T, DPM  sulfamethoxazole-trimethoprim (BACTRIM DS,SEPTRA DS) 800-160 MG tablet Take 1 tablet by mouth 2 (two) times daily. 02/04/18   Malachy Mood, MD  Vitamin D, Ergocalciferol, (DRISDOL) 50000 units CAPS capsule TAKE 1 CAPSULE BY MOUTH ONE TIME PER WEEK 01/22/18   [provider]    Allergies Penicillins  Family History  Problem Relation Age of Onset  . Cervical cancer Maternal Grandmother     Social History Social History   Tobacco Use  . Smoking  status: Never Smoker  . Smokeless tobacco: Never Used  Substance Use Topics  . Alcohol use: Yes    Comment: OCCASIONALLY  . Drug use: No    Review of Systems Constitutional: No fever/chills Eyes: No visual changes. ENT: No sore throat. Cardiovascular: Positive for chest pain resolved.  Respiratory: Denies shortness of breath. Gastrointestinal: No abdominal pain.  Positive for nausea resolved.  Genitourinary: Negative for dysuria. Musculoskeletal: Negative for back pain. Skin: Negative for rash. Neurological: Positive for headache resolved.   ____________________________________________   PHYSICAL EXAM:  VITAL SIGNS: ED Triage Vitals  Enc Vitals Group     BP 03/17/18 1800 (!) 175/85     Pulse Rate 03/17/18 1800 (!) 55     Resp 03/17/18 1800 16     Temp 03/17/18 1800 98.1 F (36.7 C)     Temp Source 03/17/18 1800 Oral     SpO2 03/17/18 1800 99 %     Weight 03/17/18 1801 (!) 301 lb (136.5 kg)     Height 03/17/18 1801 5\' 6"  (1.676 m)     Head Circumference --      Peak Flow --      Pain Score 03/17/18 1801 3   Constitutional: Alert and oriented.  Eyes: Conjunctivae are normal.  ENT      Head: Normocephalic and atraumatic.      Nose: No congestion/rhinnorhea.      Mouth/Throat: Mucous membranes are moist.      Neck: No stridor. Hematological/Lymphatic/Immunilogical: No cervical lymphadenopathy. Cardiovascular: Normal rate, regular rhythm.  No murmurs, rubs, or gallops. Respiratory: Normal respiratory effort without tachypnea nor retractions. Breath sounds are clear and equal bilaterally. No wheezes/rales/rhonchi. Gastrointestinal: Soft and non tender. No rebound. No guarding.  Genitourinary: Deferred Musculoskeletal: Normal range of motion in all extremities. No lower extremity edema. Neurologic:  Normal speech and language. No gross focal neurologic deficits are appreciated.  Skin:  Skin is warm, dry and intact. No rash noted. Psychiatric: Mood and affect are  normal. Speech and behavior are normal. Patient exhibits appropriate insight and judgment.  ____________________________________________    LABS (pertinent positives/negatives)  Trop <0.03 BMP wnl except glu 143, ca 8.6, anion gap 3 CBC wbc 9.3, hgb 11.9, plt 370  ____________________________________________   EKG  None  ____________________________________________    RADIOLOGY  CXR No acute process  ____________________________________________   PROCEDURES  Procedures  ____________________________________________   INITIAL IMPRESSION / ASSESSMENT AND PLAN / ED COURSE  Pertinent labs & imaging results that were available during my care of the patient were reviewed by me and considered in my medical decision making (see chart for details).   Patient presented to the emergency department today with multiple complaints that have all resolved by the time my examination.  She does seem to think that this time from pain medication that she took today.  Patient's blood work without concerning findings.  I did discuss with the patient the part of the reason why her blood pressure might of been elevated was because she did not take her pain medications until later in the afternoon.  She states that she will follow-up with her doctor tomorrow.   ____________________________________________   FINAL CLINICAL IMPRESSION(S) / ED DIAGNOSES  Final diagnoses:  Adverse effect of drug, initial encounter  Hypertension, unspecified type     Note: This dictation was prepared with Dragon dictation. Any transcriptional errors that result from this process are unintentional     Nance Pear, MD 03/17/18 2111

## 2018-03-17 NOTE — ED Triage Notes (Signed)
Pt in via POV with multiple complaints.  States she has had elevated BP x one day, thinking it is related to new pain med she is currently on.  Pt also reports nausea, headache, shortness of breath.  Pt hypertensive, other vitals WDL, NAD noted at this time.

## 2018-03-17 NOTE — ED Notes (Signed)
Pt left before receiving discharge paperwork. 

## 2018-03-17 NOTE — ED Notes (Signed)
Pt states last night at approx 2100 she took first dose of Oxycodone for her foot after having surgery on Friday. Pt states took another dose of pain medication today. Pt states she felt like she had SOB, HA, and pressure to her chest. Pt states BP at home was approx 200. Pt sent from [facility]. Pt A&O x 4, NAD noted. States hx of HTN at this time and takes BP meds at home.

## 2018-03-17 NOTE — Discharge Instructions (Addendum)
Please seek medical attention for any high fevers, chest pain, shortness of breath, change in behavior, persistent vomiting, bloody stool or any other new or concerning symptoms.  

## 2018-03-18 ENCOUNTER — Ambulatory Visit (INDEPENDENT_AMBULATORY_CARE_PROVIDER_SITE_OTHER): Payer: BLUE CROSS/BLUE SHIELD | Admitting: Podiatry

## 2018-03-18 VITALS — BP 164/93 | HR 60 | Temp 97.7°F

## 2018-03-18 DIAGNOSIS — M722 Plantar fascial fibromatosis: Secondary | ICD-10-CM | POA: Diagnosis not present

## 2018-03-18 DIAGNOSIS — Z9889 Other specified postprocedural states: Secondary | ICD-10-CM

## 2018-03-18 MED ORDER — TRAMADOL HCL 50 MG PO TABS: 50.0000 mg | ORAL_TABLET | Freq: Three times a day (TID) | ORAL | 0 refills | Status: DC | PRN

## 2018-03-19 NOTE — Progress Notes (Signed)
   Subjective:  Patient presents today status post EPF left. DOS: 03/14/18. She states she is doing well. She reports the pain medication caused palpitations so she has been taking Ibuprofen for pain which is not helping much. She denies any fever, chills or vomiting. Patient is here for further evaluation and treatment.    Past Medical History:  Diagnosis Date  . Anemia   . Arthritis   . Bacterial infection due to H. pylori   . Diabetes mellitus without complication ([place])   . Hypertension   . Mastitis 2014      Objective/Physical Exam Neurovascular status intact.  Skin incisions appear to be well coapted with sutures and staples intact. No sign of infectious process noted. No dehiscence. No active bleeding noted. Moderate edema noted to the surgical extremity.  Assessment: 1. s/p EPF left. DOS: 03/14/18   Plan of Care:  1. Patient was evaluated. 2. Dressing changed.  3. Continue weightbearing in CAM boot.  4. Compression anklet dispensed.  5. Prescription for Tramadol 50 mg provided to patient.  6. Return to clinic in one week for suture removal.    Edrick Kins, DPM [facility]  Dr. Edrick Kins, [address]                                        [place], [place] [address]                Office [phone]  Fax [phone]

## 2018-03-31 ENCOUNTER — Encounter: Payer: Self-pay | Admitting: Podiatry

## 2018-03-31 ENCOUNTER — Ambulatory Visit (INDEPENDENT_AMBULATORY_CARE_PROVIDER_SITE_OTHER): Payer: BLUE CROSS/BLUE SHIELD | Admitting: Podiatry

## 2018-03-31 VITALS — BP 172/97 | HR 68 | Temp 97.6°F

## 2018-03-31 DIAGNOSIS — M722 Plantar fascial fibromatosis: Secondary | ICD-10-CM

## 2018-03-31 MED ORDER — TRAMADOL HCL 50 MG PO TABS: 50.0000 mg | ORAL_TABLET | Freq: Four times a day (QID) | ORAL | 0 refills | Status: AC | PRN

## 2018-03-31 NOTE — Progress Notes (Signed)
She presents today 2 weeks status post endoscopic plantar fasciotomy date of surgery March 14, 2018 states that is doing better she refers there is some pain to the right side of the heel.  Objective: Vital signs are stable alert and oriented x3.  Pulses are palpable.  Still some ecchymosis medial aspect of the left heel.  Sutures are intact margins well coapted we remove the sutures today.  Assessment: Well-healing surgical foot.  Plan: She will discontinue the use of the cam walker start with her shoe.  And I will request that she continue to wear the night splint for at least 1 month.  I will follow-up with her when she returns from [place].

## 2018-04-14 ENCOUNTER — Encounter: Payer: BLUE CROSS/BLUE SHIELD | Admitting: Podiatry

## 2018-04-28 ENCOUNTER — Encounter: Payer: BLUE CROSS/BLUE SHIELD | Admitting: Podiatry

## 2018-05-05 ENCOUNTER — Ambulatory Visit (INDEPENDENT_AMBULATORY_CARE_PROVIDER_SITE_OTHER): Payer: BLUE CROSS/BLUE SHIELD | Admitting: Podiatry

## 2018-05-05 ENCOUNTER — Encounter: Payer: Self-pay | Admitting: Podiatry

## 2018-05-05 DIAGNOSIS — M722 Plantar fascial fibromatosis: Secondary | ICD-10-CM

## 2018-05-05 NOTE — Progress Notes (Signed)
She presents today for follow-up of her endoscopic plantar fasciotomy date of surgery 03/14/2018.  She states that the heel part is doing pretty well she is having some pain in the midfoot and forefoot most likely due to overactivity while she was in [place].  She denies fever chills nausea vomiting muscle aches pains.  Objective: Vital signs are stable she is alert and oriented x3 she has no heel pain on palpation though she does have tenderness on palpation of the middle of the medial longitudinal arch and distally.  No edema no erythema cellulitis drainage or odor.  Assessment: Some muscle compensation in the mid arch.  Well-healing status post EPF.  Plan: She will follow-up with me in about 6 weeks encouraged her to get back to work with a new pair of shoes and her orthotics.

## 2018-05-26 ENCOUNTER — Encounter: Payer: Self-pay | Admitting: Podiatry

## 2018-05-26 ENCOUNTER — Ambulatory Visit (INDEPENDENT_AMBULATORY_CARE_PROVIDER_SITE_OTHER): Payer: BLUE CROSS/BLUE SHIELD | Admitting: Podiatry

## 2018-05-26 DIAGNOSIS — M722 Plantar fascial fibromatosis: Secondary | ICD-10-CM

## 2018-05-26 DIAGNOSIS — Z9889 Other specified postprocedural states: Secondary | ICD-10-CM

## 2018-05-26 MED ORDER — HYDROCODONE-ACETAMINOPHEN 10-325 MG PO TABS: 1.0000 | ORAL_TABLET | Freq: Three times a day (TID) | ORAL | 0 refills | Status: DC | PRN

## 2018-05-26 NOTE — Progress Notes (Signed)
She presents today still complaining of pain to the bilateral foot stating that her fascia and the dorsal lateral aspect of the bilateral foot is painful there is surgery for her EPF was 03/14/2018 for her left foot.  States that is hurting on the sides in the orthotics seem to be making it worse.  Objective: Vital signs are stable alert and oriented x3.  Pulses are palpable.  His pain on palpation fourth fifth TMT bilaterally.  She has very little in the way of pain the plantar heels bilaterally though she does have some tenderness on palpation of the intrinsic musculature of the forefoot left.  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  Plan: Discussed etiology pathology conservative versus surgical therapies.  At this point I am going to send her for physical therapy and wrote a prescription for pain medication.  On follow-up with her in the next few weeks after physical therapy is completed.

## 2018-06-02 ENCOUNTER — Other Ambulatory Visit: Payer: Self-pay

## 2018-06-02 DIAGNOSIS — Z9889 Other specified postprocedural states: Secondary | ICD-10-CM

## 2018-06-02 DIAGNOSIS — M722 Plantar fascial fibromatosis: Secondary | ICD-10-CM

## 2018-06-02 NOTE — Progress Notes (Unsigned)
Referral to [facility]

## 2018-06-03 ENCOUNTER — Other Ambulatory Visit: Payer: Self-pay | Admitting: Podiatry

## 2018-06-03 MED ORDER — HYDROCODONE-ACETAMINOPHEN 10-325 MG PO TABS: 1.0000 | ORAL_TABLET | Freq: Four times a day (QID) | ORAL | 0 refills | Status: AC | PRN

## 2018-06-09 ENCOUNTER — Encounter: Payer: BLUE CROSS/BLUE SHIELD | Admitting: Podiatry

## 2018-09-09 ENCOUNTER — Ambulatory Visit: Payer: BLUE CROSS/BLUE SHIELD | Admitting: Podiatry

## 2018-09-09 ENCOUNTER — Encounter: Payer: Self-pay | Admitting: Podiatry

## 2018-09-09 ENCOUNTER — Other Ambulatory Visit: Payer: Self-pay

## 2018-09-09 VITALS — Temp 97.5°F

## 2018-09-09 DIAGNOSIS — M722 Plantar fascial fibromatosis: Secondary | ICD-10-CM | POA: Diagnosis not present

## 2018-09-09 DIAGNOSIS — G5762 Lesion of plantar nerve, left lower limb: Secondary | ICD-10-CM

## 2018-09-09 DIAGNOSIS — G5782 Other specified mononeuropathies of left lower limb: Secondary | ICD-10-CM

## 2018-09-09 MED ORDER — ETODOLAC ER 400 MG PO TB24: 400.0000 mg | ORAL_TABLET | Freq: Two times a day (BID) | ORAL | 3 refills | Status: DC

## 2018-09-09 MED ORDER — METHYLPREDNISOLONE 4 MG PO TBPK: ORAL_TABLET | ORAL | 0 refills | Status: DC

## 2018-09-10 ENCOUNTER — Encounter: Payer: Self-pay | Admitting: Podiatry

## 2018-09-10 NOTE — Progress Notes (Signed)
She presents today chief complaint of I have pain in my surgical foot and physical therapy was helping as she refers to the left foot but we had to be discontinued because of COVID-19.  Objective: Vital signs are stable she is alert and oriented x3.  Pulses are strongly palpable.  Slightly diminished sensation per Semmes Weinstein monofilament.  She has pain on palpation plantar central Calcaneal tubercle left foot.  She also has pain on palpation to the third interdigital space of the left foot.  Assessment: Plantar fasciitis of the central band of the plantar fascia left.  She also has neuroma third interdigital space left.  Diabetic with diabetic peripheral neuropathy resulting in chronic pain.  Plan: Discussed etiology pathology and surgical therapy start her on methylprednisolone followed by etodolac.  She will follow-up with Liliane Channel for evaluation for diabetic shoes secondary to her chronic pain and deformities of the foot.  Also injected the plantar fascia today with 20 mg Kenalog 5 mg Marcaine 10 mg of Kenalog and 5 mg of Marcaine were injected into the third interdigital space left.  I will follow-up with her once [facility] is completed her diabetic shoes.

## 2018-10-13 ENCOUNTER — Ambulatory Visit: Payer: BLUE CROSS/BLUE SHIELD | Admitting: Podiatry

## 2018-10-15 ENCOUNTER — Emergency Department
Admission: EM | Admit: 2018-10-15 | Discharge: 2018-10-15 | Disposition: A | Discharge status: Other Acute Inpatient Hospital | Payer: BC Managed Care – PPO | Attending: Student in an Organized Health Care Education/Training Program | Admitting: Student in an Organized Health Care Education/Training Program

## 2018-10-15 ENCOUNTER — Encounter: Payer: Self-pay | Admitting: *Deleted

## 2018-10-15 ENCOUNTER — Emergency Department: Payer: BC Managed Care – PPO

## 2018-10-15 ENCOUNTER — Other Ambulatory Visit: Payer: Self-pay

## 2018-10-15 ENCOUNTER — Encounter (HOSPITAL_COMMUNITY): Payer: Self-pay | Admitting: Family Medicine

## 2018-10-15 ENCOUNTER — Inpatient Hospital Stay (HOSPITAL_COMMUNITY)
Admission: AD | Admit: 2018-10-15 | Discharge: 2018-10-19 | DRG: 177 | Disposition: A | Discharge status: Home / Self Care | Payer: BC Managed Care – PPO | Source: Other Acute Inpatient Hospital | Attending: Family Medicine | Admitting: Family Medicine

## 2018-10-15 DIAGNOSIS — E876 Hypokalemia: Secondary | ICD-10-CM | POA: Diagnosis present

## 2018-10-15 DIAGNOSIS — Z7984 Long term (current) use of oral hypoglycemic drugs: Secondary | ICD-10-CM

## 2018-10-15 DIAGNOSIS — J069 Acute upper respiratory infection, unspecified: Secondary | ICD-10-CM | POA: Diagnosis present

## 2018-10-15 DIAGNOSIS — J9601 Acute respiratory failure with hypoxia: Secondary | ICD-10-CM | POA: Diagnosis present

## 2018-10-15 DIAGNOSIS — Z6841 Body Mass Index (BMI) 40.0 and over, adult: Secondary | ICD-10-CM

## 2018-10-15 DIAGNOSIS — E1165 Type 2 diabetes mellitus with hyperglycemia: Secondary | ICD-10-CM | POA: Diagnosis present

## 2018-10-15 DIAGNOSIS — A4189 Other specified sepsis: Secondary | ICD-10-CM | POA: Diagnosis present

## 2018-10-15 DIAGNOSIS — T380X5A Adverse effect of glucocorticoids and synthetic analogues, initial encounter: Secondary | ICD-10-CM | POA: Diagnosis present

## 2018-10-15 DIAGNOSIS — M1990 Unspecified osteoarthritis, unspecified site: Secondary | ICD-10-CM | POA: Diagnosis present

## 2018-10-15 DIAGNOSIS — Z79899 Other long term (current) drug therapy: Secondary | ICD-10-CM | POA: Diagnosis not present

## 2018-10-15 DIAGNOSIS — R0602 Shortness of breath: Secondary | ICD-10-CM | POA: Diagnosis present

## 2018-10-15 DIAGNOSIS — Z880 Allergy status to penicillin: Secondary | ICD-10-CM | POA: Diagnosis not present

## 2018-10-15 DIAGNOSIS — U071 COVID-19: Principal | ICD-10-CM

## 2018-10-15 DIAGNOSIS — K5900 Constipation, unspecified: Secondary | ICD-10-CM | POA: Diagnosis not present

## 2018-10-15 DIAGNOSIS — R509 Fever, unspecified: Secondary | ICD-10-CM | POA: Diagnosis present

## 2018-10-15 DIAGNOSIS — E119 Type 2 diabetes mellitus without complications: Secondary | ICD-10-CM

## 2018-10-15 DIAGNOSIS — I10 Essential (primary) hypertension: Secondary | ICD-10-CM | POA: Diagnosis present

## 2018-10-15 DIAGNOSIS — J1289 Other viral pneumonia: Secondary | ICD-10-CM | POA: Diagnosis present

## 2018-10-15 HISTORY — DX: COVID-19: U07.1

## 2018-10-15 LAB — COMPREHENSIVE METABOLIC PANEL
ALT: 65 U/L — ABNORMAL HIGH (ref 0–44)
AST: 64 U/L — ABNORMAL HIGH (ref 15–41)
Albumin: 3.3 g/dL — ABNORMAL LOW (ref 3.5–5.0)
Alkaline Phosphatase: 67 U/L (ref 38–126)
Anion gap: 12 (ref 5–15)
BUN: 8 mg/dL (ref 6–20)
CO2: 30 mmol/L (ref 22–32)
Calcium: 8.6 mg/dL — ABNORMAL LOW (ref 8.9–10.3)
Chloride: 99 mmol/L (ref 98–111)
Creatinine, Ser: 0.75 mg/dL (ref 0.44–1.00)
GFR calc Af Amer: >60 mL/min (ref >60)
GFR calc non Af Amer: >60 mL/min (ref >60)
Glucose, Bld: 168 mg/dL — ABNORMAL HIGH (ref 70–99)
Potassium: 2.7 mmol/L — CL (ref 3.5–5.1)
Sodium: 141 mmol/L (ref 135–145)
Total Bilirubin: 0.7 mg/dL (ref 0.3–1.2)
Total Protein: 7.6 g/dL (ref 6.5–8.1)

## 2018-10-15 LAB — GLUCOSE, CAPILLARY
Glucose-Capillary: 241 mg/dL — ABNORMAL HIGH (ref 70–99)
Glucose-Capillary: 271 mg/dL — ABNORMAL HIGH (ref 70–99)

## 2018-10-15 LAB — FERRITIN: Ferritin: 686 ng/mL — ABNORMAL HIGH (ref 11–307)

## 2018-10-15 LAB — CBC WITH DIFFERENTIAL/PLATELET
Abs Immature Granulocytes: 0.02 10*3/uL (ref 0.00–0.07)
Basophils Absolute: 0 10*3/uL (ref 0.0–0.1)
Basophils Relative: 0 %
Eosinophils Absolute: 0 10*3/uL (ref 0.0–0.5)
Eosinophils Relative: 0 %
HCT: 38.7 % (ref 36.0–46.0)
Hemoglobin: 12.6 g/dL (ref 12.0–15.0)
Immature Granulocytes: 0 %
Lymphocytes Relative: 19 %
Lymphs Abs: 1.2 10*3/uL (ref 0.7–4.0)
MCH: 26.3 pg (ref 26.0–34.0)
MCHC: 32.6 g/dL (ref 30.0–36.0)
MCV: 80.6 fL (ref 80.0–100.0)
Monocytes Absolute: 0.3 10*3/uL (ref 0.1–1.0)
Monocytes Relative: 4 %
Neutro Abs: 4.8 10*3/uL (ref 1.7–7.7)
Neutrophils Relative %: 77 %
Platelets: 379 10*3/uL (ref 150–400)
RBC: 4.8 MIL/uL (ref 3.87–5.11)
RDW: 13.2 % (ref 11.5–15.5)
WBC: 6.3 10*3/uL (ref 4.0–10.5)
nRBC: 0 % (ref 0.0–0.2)

## 2018-10-15 LAB — TRIGLYCERIDES: Triglycerides: 108 mg/dL (ref <150)

## 2018-10-15 LAB — PROCALCITONIN: Procalcitonin: <0.1 ng/mL

## 2018-10-15 LAB — C-REACTIVE PROTEIN: CRP: 23.5 mg/dL — ABNORMAL HIGH (ref <1.0)

## 2018-10-15 LAB — FIBRIN DERIVATIVES D-DIMER (ARMC ONLY): Fibrin derivatives D-dimer (ARMC): 1087.31 ng/mL (FEU) — ABNORMAL HIGH (ref 0.00–499.00)

## 2018-10-15 LAB — SARS CORONAVIRUS 2 BY RT PCR (HOSPITAL ORDER, PERFORMED IN ~~LOC~~ HOSPITAL LAB): SARS Coronavirus 2: POSITIVE — AB

## 2018-10-15 LAB — FIBRINOGEN: Fibrinogen: >750 mg/dL — ABNORMAL HIGH (ref 210–475)

## 2018-10-15 LAB — LACTATE DEHYDROGENASE: LDH: 431 U/L — ABNORMAL HIGH (ref 98–192)

## 2018-10-15 LAB — LACTIC ACID, PLASMA: Lactic Acid, Venous: 1.4 mmol/L (ref 0.5–1.9)

## 2018-10-15 MED ORDER — ZINC SULFATE 220 (50 ZN) MG PO CAPS
220.0000 mg | ORAL_CAPSULE | Freq: Every day | ORAL | Status: DC
  Administered 2018-10-15 – 2018-10-19 (×5): 220 mg via ORAL
  Filled 2018-10-15 (×5): qty 1

## 2018-10-15 MED ORDER — VITAMIN C 500 MG PO TABS
500.0000 mg | ORAL_TABLET | Freq: Every day | ORAL | Status: DC
  Administered 2018-10-15 – 2018-10-19 (×5): 500 mg via ORAL
  Filled 2018-10-15 (×5): qty 1

## 2018-10-15 MED ORDER — SODIUM CHLORIDE 0.9 % IV SOLN
100.0000 mg | INTRAVENOUS | Status: AC
  Administered 2018-10-16 – 2018-10-19 (×4): 100 mg via INTRAVENOUS
  Filled 2018-10-15 (×4): qty 20

## 2018-10-15 MED ORDER — ONDANSETRON HCL 4 MG/2ML IJ SOLN: 4.0000 mg | Freq: Four times a day (QID) | INTRAMUSCULAR | Status: DC | PRN

## 2018-10-15 MED ORDER — SODIUM CHLORIDE 0.9 % IV SOLN
500.0000 mg | INTRAVENOUS | Status: DC
  Administered 2018-10-15: 500 mg via INTRAVENOUS
  Filled 2018-10-15: qty 500

## 2018-10-15 MED ORDER — ENOXAPARIN SODIUM 80 MG/0.8ML ~~LOC~~ SOLN
0.5000 mg/kg | SUBCUTANEOUS | Status: DC
  Administered 2018-10-15 – 2018-10-18 (×4): 70 mg via SUBCUTANEOUS
  Filled 2018-10-15 (×4): qty 0.8

## 2018-10-15 MED ORDER — SODIUM CHLORIDE 0.9 % IV SOLN
200.0000 mg | Freq: Once | INTRAVENOUS | Status: AC
  Administered 2018-10-15: 200 mg via INTRAVENOUS
  Filled 2018-10-15: qty 40

## 2018-10-15 MED ORDER — HYDROCOD POLST-CPM POLST ER 10-8 MG/5ML PO SUER
5.0000 mL | Freq: Two times a day (BID) | ORAL | Status: DC | PRN
  Administered 2018-10-16 – 2018-10-17 (×3): 5 mL via ORAL
  Filled 2018-10-15 (×3): qty 5

## 2018-10-15 MED ORDER — ONDANSETRON HCL 4 MG PO TABS: 4.0000 mg | ORAL_TABLET | Freq: Four times a day (QID) | ORAL | Status: DC | PRN

## 2018-10-15 MED ORDER — SODIUM CHLORIDE 0.9% FLUSH
3.0000 mL | Freq: Two times a day (BID) | INTRAVENOUS | Status: DC
  Administered 2018-10-15 – 2018-10-19 (×8): 3 mL via INTRAVENOUS

## 2018-10-15 MED ORDER — GUAIFENESIN-DM 100-10 MG/5ML PO SYRP
10.0000 mL | ORAL_SOLUTION | ORAL | Status: DC | PRN
  Administered 2018-10-16 – 2018-10-19 (×2): 10 mL via ORAL
  Filled 2018-10-15 (×3): qty 10

## 2018-10-15 MED ORDER — POTASSIUM CHLORIDE CRYS ER 20 MEQ PO TBCR
40.0000 meq | EXTENDED_RELEASE_TABLET | Freq: Two times a day (BID) | ORAL | Status: AC
  Administered 2018-10-15 – 2018-10-16 (×3): 40 meq via ORAL
  Filled 2018-10-15 (×3): qty 2

## 2018-10-15 MED ORDER — INSULIN ASPART 100 UNIT/ML ~~LOC~~ SOLN
0.0000 [IU] | Freq: Every day | SUBCUTANEOUS | Status: DC
  Administered 2018-10-15: 22:00:00 3 [IU] via SUBCUTANEOUS

## 2018-10-15 MED ORDER — POTASSIUM CHLORIDE CRYS ER 20 MEQ PO TBCR
40.0000 meq | EXTENDED_RELEASE_TABLET | Freq: Once | ORAL | Status: AC
  Administered 2018-10-15: 40 meq via ORAL
  Filled 2018-10-15: qty 2

## 2018-10-15 MED ORDER — ACETAMINOPHEN 325 MG PO TABS: 650.0000 mg | ORAL_TABLET | Freq: Four times a day (QID) | ORAL | Status: DC | PRN

## 2018-10-15 MED ORDER — DEXAMETHASONE SODIUM PHOSPHATE 10 MG/ML IJ SOLN
10.0000 mg | Freq: Once | INTRAMUSCULAR | Status: AC
  Administered 2018-10-15: 10 mg via INTRAVENOUS
  Filled 2018-10-15: qty 1

## 2018-10-15 MED ORDER — INSULIN ASPART 100 UNIT/ML ~~LOC~~ SOLN
0.0000 [IU] | Freq: Three times a day (TID) | SUBCUTANEOUS | Status: DC
  Administered 2018-10-15: 18:00:00 5 [IU] via SUBCUTANEOUS

## 2018-10-15 MED ORDER — SODIUM CHLORIDE 0.9 % IV SOLN
2.0000 g | INTRAVENOUS | Status: DC
  Administered 2018-10-15: 2 g via INTRAVENOUS
  Filled 2018-10-15: qty 20

## 2018-10-15 MED ORDER — MORPHINE SULFATE (PF) 4 MG/ML IV SOLN
4.0000 mg | INTRAVENOUS | Status: DC | PRN
  Administered 2018-10-15: 4 mg via INTRAVENOUS
  Filled 2018-10-15: qty 1

## 2018-10-15 MED ORDER — METHYLPREDNISOLONE SODIUM SUCC 125 MG IJ SOLR
60.0000 mg | Freq: Three times a day (TID) | INTRAMUSCULAR | Status: DC
  Administered 2018-10-15 – 2018-10-18 (×9): 60 mg via INTRAVENOUS
  Filled 2018-10-15 (×9): qty 2

## 2018-10-15 MED ORDER — TOCILIZUMAB 400 MG/20ML IV SOLN
800.0000 mg | Freq: Once | INTRAVENOUS | Status: AC
  Administered 2018-10-15: 800 mg via INTRAVENOUS
  Filled 2018-10-15: qty 40

## 2018-10-15 MED ORDER — ONDANSETRON HCL 4 MG/2ML IJ SOLN
4.0000 mg | Freq: Once | INTRAMUSCULAR | Status: AC
  Administered 2018-10-15: 4 mg via INTRAVENOUS
  Filled 2018-10-15: qty 2

## 2018-10-15 NOTE — Plan of Care (Signed)

## 2018-10-15 NOTE — Progress Notes (Signed)
Pt arrived to unit and transferred to bed. Identified appropriately and placed on telemetry with continuous pulse ox. Notified Dr. Bonner Puna of patient's arrival and need for orders. Will continue to monitor patient.

## 2018-10-15 NOTE — ED Notes (Signed)
ED Provider at bedside. 

## 2018-10-15 NOTE — ED Notes (Signed)
Report given to [facility]. ETA 30 minutes. °

## 2018-10-15 NOTE — ED Notes (Signed)
Pt assisted to use toilet and given a toothbrush and toothpaste

## 2018-10-15 NOTE — ED Triage Notes (Signed)
Per EMS pt dx on 6/13 with Covid 19 and tonight short of breath nausea and diarrhea

## 2018-10-15 NOTE — ED Notes (Signed)
Pt called and spoke to her husband and told him she was going to [employer] in [place]

## 2018-10-15 NOTE — ED Provider Notes (Signed)
[facility] Emergency Department Provider Note    First MD Initiated Contact with Patient 10/15/18 0507     (approximate)  I have reviewed the triage vital signs and the nursing notes.   HISTORY  Chief Complaint Shortness of Breath    HPI Paula Flores is a 53 y.o. female with reported recent coronavirus test being +4 days ago presents the ER for worsening flulike illness.  Reports that her symptoms include primarily cough weakness muscle aches nausea and many episodes of diarrhea.  States that she is also worried that her blood sugars have been poorly controlled.  Does not wear home oxygen.  Does have a history of high blood pressure.  Multiple family members are sick with similar symptoms.  arrives from home via ems   Past Medical History:  Diagnosis Date  . Anemia   . Arthritis   . Bacterial infection due to H. pylori   . COVID-19 virus detected 6/13/20230  . Diabetes mellitus without complication ([place])   . Hypertension   . Mastitis 2014   Family History  Problem Relation Age of Onset  . Cervical cancer Maternal Grandmother    Past Surgical History:  Procedure Laterality Date  . ABLATION ON ENDOMETRIOSIS  09/04/2011  . BOWEL RESECTION  1994  . BREAST BIOPSY  11/03/2014  . CESAREAN SECTION    . CHOLECYSTECTOMY  1994   had bowel resection  . FOOT SURGERY Right   . HYSTEROSCOPY W/D&C N/A 10/13/2015   Procedure: DILATATION AND CURETTAGE /HYSTEROSCOPY;  Surgeon: Malachy Mood, MD;  Location: [facility] ORS;  Service: Gynecology;  Laterality: N/A;  . TONSILLECTOMY    . TUBAL LIGATION  1994   Patient Active Problem List   Diagnosis Date Noted  . BP (high blood pressure) 07/06/2015  . 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 Lump or mass in breast 10/27/2012  . Mastitis 10/27/2012      Prior to Admission medications   Medication Sig Start Date End Date Taking? Authorizing Provider  amLODipine (NORVASC) 5 MG tablet  08/12/18    [provider]  benzonatate (TESSALON) 100 MG capsule Take 1-2 capsules by mouth 3 (three) times daily. 10/04/18   [provider]  celecoxib (CELEBREX) 200 MG capsule Take 1 capsule by mouth daily. 10/13/18   [provider]  chlorthalidone (HYGROTON) 25 MG tablet Take 25 mg by mouth daily.    [provider]  etodolac (LODINE XL) 400 MG 24 hr tablet Take 1 tablet (400 mg total) by mouth 2 (two) times a day. 09/09/18   Hyatt, Max T, DPM  glipiZIDE (GLUCOTROL XL) 5 MG 24 hr tablet Take 1 tablet by mouth daily. 10/13/18   [provider]  HYDROcodone-acetaminophen (NORCO) 10-325 MG tablet Take 1 tablet by mouth every 8 (eight) hours as needed. 05/26/18   Hyatt, Max T, DPM  losartan (COZAAR) 100 MG tablet Take 100 mg by mouth daily. 02/05/18   [provider]  losartan-hydrochlorothiazide (HYZAAR) 100-25 MG tablet Take by mouth.    [provider]  metaxalone (SKELAXIN) 800 MG tablet Take 800 mg by mouth 3 (three) times daily. 09/18/17   [provider]  metFORMIN (GLUCOPHAGE) 500 MG tablet Take 500 mg by mouth 2 (two) times daily. 08/28/18   [provider]  methocarbamol (ROBAXIN) 500 MG tablet Take 500 mg by mouth 2 (two) times daily as needed. 02/07/18   [provider]  methylPREDNISolone (MEDROL DOSEPAK) 4 MG TBPK tablet 6 day dose  pack - take as directed 09/09/18   Hyatt, Max T, DPM  metoprolol tartrate (LOPRESSOR) 25 MG tablet Take 50 mg by mouth 2 (two) times daily.     [provider]  traMADol (ULTRAM) 50 MG tablet Take 1 tablet (50 mg total) by mouth every 8 (eight) hours as needed. 03/18/18   Edrick Kins, DPM  Vitamin D, Ergocalciferol, (DRISDOL) 50000 units CAPS capsule TAKE 1 CAPSULE BY MOUTH ONE TIME PER WEEK 01/22/18   [provider]    Allergies Penicillins    Social History Social History   Tobacco Use  . Smoking status: Never Smoker  . Smokeless tobacco: Never Used   Substance Use Topics  . Alcohol use: Yes    Comment: OCCASIONALLY  . Drug use: No    Review of Systems Patient denies headaches, rhinorrhea, blurry vision, numbness, shortness of breath, chest pain, edema, cough, abdominal pain, nausea, vomiting, diarrhea, dysuria, fevers, rashes or hallucinations unless otherwise stated above in HPI. ____________________________________________   PHYSICAL EXAM:  VITAL SIGNS: Vitals:   10/15/18 0550 10/15/18 0600  BP:  120/76  Pulse: 91 89  Resp: 16 15  Temp:    SpO2: 97% 95%    Constitutional: Alert and oriented.  Ill-appearing in mild respiratory distress requiring supplemental oxygen Eyes: Conjunctivae are normal.  Head: Atraumatic. Nose: No congestion/rhinnorhea. Mouth/Throat: Mucous membranes are dry.   Neck: No stridor. Painless ROM.  Cardiovascular: Normal rate, regular rhythm. Grossly normal heart sounds.  Good peripheral circulation. Respiratory: Tachypnea with posterior crackles gastrointestinal: Soft and nontender. No distention. No abdominal bruits. No CVA tenderness. Genitourinary:  Musculoskeletal: No lower extremity tenderness nor edema.  No joint effusions. Neurologic:  Normal speech and language. No gross focal neurologic deficits are appreciated. No facial droop Skin:  Skin is warm, dry and intact. No rash noted. Psychiatric: Mood and affect are normal. Speech and behavior are normal.  ____________________________________________   LABS (all labs ordered are listed, but only abnormal results are displayed)  Results for orders placed or performed during the hospital encounter of 10/15/18 (from the past 24 hour(s))  Lactic acid, plasma     Status: None   Collection Time: 10/15/18  5:21 AM  Result Value Ref Range   Lactic Acid, Venous 1.4 0.5 - 1.9 mmol/L  CBC WITH DIFFERENTIAL     Status: None   Collection Time: 10/15/18  5:21 AM  Result Value Ref Range   WBC 6.3 4.0 - 10.5 K/uL   RBC 4.80 3.87 - 5.11 MIL/uL    Hemoglobin 12.6 12.0 - 15.0 g/dL   HCT 38.7 36.0 - 46.0 %   MCV 80.6 80.0 - 100.0 fL   MCH 26.3 26.0 - 34.0 pg   MCHC 32.6 30.0 - 36.0 g/dL   RDW 13.2 11.5 - 15.5 %   Platelets 379 150 - 400 K/uL   nRBC 0.0 0.0 - 0.2 %   Neutrophils Relative % 77 %   Neutro Abs 4.8 1.7 - 7.7 K/uL   Lymphocytes Relative 19 %   Lymphs Abs 1.2 0.7 - 4.0 K/uL   Monocytes Relative 4 %   Monocytes Absolute 0.3 0.1 - 1.0 K/uL   Eosinophils Relative 0 %   Eosinophils Absolute 0.0 0.0 - 0.5 K/uL   Basophils Relative 0 %   Basophils Absolute 0.0 0.0 - 0.1 K/uL   Immature Granulocytes 0 %   Abs Immature Granulocytes 0.02 0.00 - 0.07 K/uL  Comprehensive metabolic panel     Status: Abnormal  Collection Time: 10/15/18  5:21 AM  Result Value Ref Range   Sodium 141 135 - 145 mmol/L   Potassium 2.7 (LL) 3.5 - 5.1 mmol/L   Chloride 99 98 - 111 mmol/L   CO2 30 22 - 32 mmol/L   Glucose, Bld 168 (H) 70 - 99 mg/dL   BUN 8 6 - 20 mg/dL   Creatinine, Ser 0.75 0.44 - 1.00 mg/dL   Calcium 8.6 (L) 8.9 - 10.3 mg/dL   Total Protein 7.6 6.5 - 8.1 g/dL   Albumin 3.3 (L) 3.5 - 5.0 g/dL   AST 64 (H) 15 - 41 U/L   ALT 65 (H) 0 - 44 U/L   Alkaline Phosphatase 67 38 - 126 U/L   Total Bilirubin 0.7 0.3 - 1.2 mg/dL   GFR calc non Af Amer >60 >60 mL/min   GFR calc Af Amer >60 >60 mL/min   Anion gap 12 5 - 15  Fibrin derivatives D-Dimer     Status: Abnormal   Collection Time: 10/15/18  5:21 AM  Result Value Ref Range   Fibrin derivatives D-dimer ([facility]) 1,087.31 (H) 0.00 - 499.00 ng/mL (FEU)  Procalcitonin     Status: None   Collection Time: 10/15/18  5:21 AM  Result Value Ref Range   Procalcitonin <0.10 ng/mL  Lactate dehydrogenase     Status: Abnormal   Collection Time: 10/15/18  5:21 AM  Result Value Ref Range   LDH 431 (H) 98 - 192 U/L  Ferritin     Status: Abnormal   Collection Time: 10/15/18  5:21 AM  Result Value Ref Range   Ferritin 686 (H) 11 - 307 ng/mL  Triglycerides     Status: None   Collection Time:  10/15/18  5:21 AM  Result Value Ref Range   Triglycerides 108 <150 mg/dL  Fibrinogen     Status: Abnormal   Collection Time: 10/15/18  5:21 AM  Result Value Ref Range   Fibrinogen >750 (H) 210 - 475 mg/dL  SARS Coronavirus 2 (CEPHEID- Performed in [place] hospital lab), Hosp Order     Status: Abnormal   Collection Time: 10/15/18  5:21 AM   Specimen: Nasopharyngeal Swab  Result Value Ref Range   SARS Coronavirus 2 POSITIVE (A) NEGATIVE   ____________________________________________  EKG My review and personal interpretation at Time: 5:14   Indication: sob  Rate: 90  Rhythm: sinus Axis: normal Other: normal intervals, no stemi ____________________________________________  RADIOLOGY  I personally reviewed all radiographic images ordered to evaluate for the above acute complaints and reviewed radiology reports and findings.  These findings were personally discussed with the patient.  Please see medical record for radiology report.  ____________________________________________   PROCEDURES  Procedure(s) performed:  .Critical Care Performed by: Merlyn Lot, MD Authorized by: Merlyn Lot, MD   Critical care provider statement:    Critical care time (minutes):  30   Critical care time was exclusive of:  Separately billable procedures and treating other patients   Critical care was necessary to treat or prevent imminent or life-threatening deterioration of the following conditions:  Respiratory failure   Critical care was time spent personally by me on the following activities:  Development of treatment plan with patient or surrogate, discussions with consultants, evaluation of patient's response to treatment, examination of patient, obtaining history from patient or surrogate, ordering and performing treatments and interventions, ordering and review of laboratory studies, ordering and review of radiographic studies, pulse oximetry, re-evaluation of patient's condition  and review of  old charts      Critical Care performed: yes ____________________________________________   INITIAL IMPRESSION / ASSESSMENT AND PLAN / ED COURSE  Pertinent labs & imaging results that were available during my care of the patient were reviewed by me and considered in my medical decision making (see chart for details).   DDX: Asthma, copd, CHF, pna, ptx, malignancy, Pe, anemia, covid19   ARNESHA SCHIRALDI is a 53 y.o. who presents to the ED with acute respiratory failure with hypoxia requiring supplemental oxygen.  Patient is febrile certainly concerning for sepsis most likely secondary COVID-19 however upon review of medical records in care everywhere unable to find documented coronavirus test.  Blood work will be sent for the above differential.  She is protecting her airway.  Will obtain cultures and septic work-up.  The patient will be placed on continuous pulse oximetry and telemetry for monitoring.  Laboratory evaluation will be sent to evaluate for the above complaints.     Clinical Course as of Oct 15 642  Wed Oct 15, 2018  0626 Chest x-ray with bilateral infiltrates consistent with COVID-19.  Will start on antibiotics for presumed pneumonia as well given her hypoxia.  Lactate normal otherwise hemodynamics are stable.  Will require hospitalization for further medical management.   [PR]    Clinical Course User Index [PR] Merlyn Lot, MD    The patient was evaluated in Emergency Department today for the symptoms described in the history of present illness. He/she was evaluated in the context of the global COVID-19 pandemic, which necessitated consideration that the patient might be at risk for infection with the SARS-CoV-2 virus that causes COVID-19. Institutional protocols and algorithms that pertain to the evaluation of patients at risk for COVID-19 are in a state of rapid change based on information released by regulatory bodies including the CDC and federal  and state organizations. These policies and algorithms were followed during the patient's care in the ED.  As part of my medical decision making, I reviewed the following data within the [place] notes reviewed and incorporated, Labs reviewed, notes from prior ED visits and [place] Controlled Substance Database   ____________________________________________   FINAL CLINICAL IMPRESSION(S) / ED DIAGNOSES  Final diagnoses:  SOB (shortness of breath)  Acute respiratory failure with hypoxia (HCC)      NEW MEDICATIONS STARTED DURING THIS VISIT:  New Prescriptions   No medications on file     Note:  This document was prepared using Dragon voice recognition software and may include unintentional dictation errors.    Merlyn Lot, MD 10/15/18 [phone]

## 2018-10-15 NOTE — H&P (Signed)
History and Physical   Paula Flores YQI:[id number] DOB: 1965-08-27 DOA: 10/15/2018  Referring MD/NP/PA: Dr. Quentin Cornwall, [place] PCP: Casilda Carls, MD Outpatient Specialists: None  Patient coming from: Home by way of [facility]  Chief Complaint: Shortness of breath  HPI: Paula Flores is a 53 y.o. female with a history of HTN, T2DM, and obesity who presents with cough, weakness, myalgias, nausea and diarrhea with associated fever and hyperglycemia. Symptoms began about 5 days ago, remained constant, worsening despite supportive measures and rest at home. Multiple family members have similar symptoms and she tested positive for coronavirus 6/13. She presented to ED via EMS early 6/24 due to worsening shortness of breath found to be hypoxic with bilateral CXR infiltrates and confirmed positive coronavirus testing. Admission to [facility] requested. Steroids given, abx given, cultures taken. Hemodynamically stable and satting in mid-low 90%'s on 2.5L O2. On arrival at [facility] she states her shortness of breath is getting worse and is worse with exertion or with prolonged speaking.  Review of Systems: +Low-grade fever, and per HPI. All others reviewed and are negative.   Past Medical History:  Diagnosis Date  . Anemia   . Arthritis   . Bacterial infection due to H. pylori   . COVID-19 virus detected 6/13/20230  . Diabetes mellitus without complication ([place])   . Hypertension   . Mastitis 2014   Past Surgical History:  Procedure Laterality Date  . ABLATION ON ENDOMETRIOSIS  09/04/2011  . BOWEL RESECTION  1994  . BREAST BIOPSY  11/03/2014  . CESAREAN SECTION    . CHOLECYSTECTOMY  1994   had bowel resection  . FOOT SURGERY Right   . HYSTEROSCOPY W/D&C N/A 10/13/2015   Procedure: DILATATION AND CURETTAGE /HYSTEROSCOPY;  Surgeon: Malachy Mood, MD;  Location: [facility] ORS;  Service: Gynecology;  Laterality: N/A;  . TONSILLECTOMY    . TUBAL LIGATION  1994   - Never smoker, lives with spouse, no heavy  EtOH use.    reports that she has never smoked. She has never used smokeless tobacco. She reports current alcohol use. She reports that she does not use drugs. Allergies  Allergen Reactions  . Penicillins Rash   Family History  Problem Relation Age of Onset  . Cervical cancer Maternal Grandmother    - Family history otherwise reviewed and not pertinent.   Prior to Admission medications   Medication Sig Start Date End Date Taking? Authorizing Provider  amLODipine (NORVASC) 5 MG tablet  08/12/18   [provider]  benzonatate (TESSALON) 100 MG capsule Take 1-2 capsules by mouth 3 (three) times daily. 10/04/18   [provider]  celecoxib (CELEBREX) 200 MG capsule Take 1 capsule by mouth daily. 10/13/18   [provider]  chlorthalidone (HYGROTON) 25 MG tablet Take 25 mg by mouth daily.    [provider]  etodolac (LODINE XL) 400 MG 24 hr tablet Take 1 tablet (400 mg total) by mouth 2 (two) times a day. 09/09/18   Hyatt, Max T, DPM  glipiZIDE (GLUCOTROL XL) 5 MG 24 hr tablet Take 1 tablet by mouth daily. 10/13/18   [provider]  HYDROcodone-acetaminophen (NORCO) 10-325 MG tablet Take 1 tablet by mouth every 8 (eight) hours as needed. 05/26/18   Hyatt, Max T, DPM  losartan (COZAAR) 100 MG tablet Take 100 mg by mouth daily. 02/05/18   [provider]  losartan-hydrochlorothiazide (HYZAAR) 100-25 MG tablet Take by mouth.    [provider]  metaxalone (SKELAXIN) 800 MG tablet Take 800  mg by mouth 3 (three) times daily. 09/18/17   [provider]  metFORMIN (GLUCOPHAGE) 500 MG tablet Take 500 mg by mouth 2 (two) times daily. 08/28/18   [provider]  methocarbamol (ROBAXIN) 500 MG tablet Take 500 mg by mouth 2 (two) times daily as needed. 02/07/18   [provider]  methylPREDNISolone (MEDROL DOSEPAK) 4 MG TBPK tablet 6 day dose pack - take as directed 09/09/18   Hyatt, Max T, DPM  metoprolol tartrate  (LOPRESSOR) 25 MG tablet Take 50 mg by mouth 2 (two) times daily.     [provider]  traMADol (ULTRAM) 50 MG tablet Take 1 tablet (50 mg total) by mouth every 8 (eight) hours as needed. 03/18/18   Edrick Kins, DPM  Vitamin D, Ergocalciferol, (DRISDOL) 50000 units CAPS capsule TAKE 1 CAPSULE BY MOUTH ONE TIME PER WEEK 01/22/18   [provider]    Physical Exam: Vitals:   10/15/18 1352 10/15/18 1400  BP: (!) 127/95 125/86  Pulse:  72  Resp:  (!) 22  Temp: 98.6 F (37 C)   TempSrc: Oral   SpO2:  96%  Weight: 135.4 kg   Height: 5\' 6"  (1.676 m)    Constitutional: Acutely ill-appearing obese female, calm demeanor Eyes: Lids and conjunctivae normal, PERRL ENMT: Mucous membranes are tacky. Posterior pharynx clear of any exudate or lesions. Fair dentition.  Neck: normal, supple, no masses, no thyromegaly Respiratory: Non-labored but tachypneic on supplemental oxygen. Speaks in phrases without accessory muscle use. Clear, distant breath sounds to auscultation bilaterally Cardiovascular: Regular rate and rhythm, no murmurs, rubs, or gallops. No carotid bruits. No JVD. No LE edema. Palpable pedal pulses. Abdomen: Normoactive bowel sounds. No tenderness, non-distended, and no masses palpated. No hepatosplenomegaly. GU: No indwelling catheter Musculoskeletal: No clubbing / cyanosis. No joint deformity upper and lower extremities. Good ROM, no contractures. Normal muscle tone.  Skin: Warm, dry. No rashes, wounds, or ulcers. No significant lesions noted.  Neurologic: CN II-XII grossly intact. Speech normal. No focal deficits in motor strength or sensation in all extremities.  Psychiatric: Alert and oriented x3. Normal judgment and insight. Mood euthymic with depressed affect.   Labs on Admission: I have personally reviewed following labs and imaging studies  CBC: Recent Labs  Lab 10/15/18 0521  WBC 6.3  NEUTROABS 4.8  HGB 12.6  HCT 38.7  MCV 80.6  PLT 161   Basic  Metabolic Panel: Recent Labs  Lab 10/15/18 0521  NA 141  K 2.7*  CL 99  CO2 30  GLUCOSE 168*  BUN 8  CREATININE 0.75  CALCIUM 8.6*   GFR: Estimated Creatinine Clearance: 117.8 mL/min (by C-G formula based on SCr of 0.75 mg/dL). Liver Function Tests: Recent Labs  Lab 10/15/18 0521  AST 64*  ALT 65*  ALKPHOS 67  BILITOT 0.7  PROT 7.6  ALBUMIN 3.3*   No results for input(s): LIPASE, AMYLASE in the last 168 hours. No results for input(s): AMMONIA in the last 168 hours. Coagulation Profile: No results for input(s): INR, PROTIME in the last 168 hours. Cardiac Enzymes: No results for input(s): CKTOTAL, CKMB, CKMBINDEX, TROPONINI in the last 168 hours. BNP (last 3 results) No results for input(s): PROBNP in the last 8760 hours. HbA1C: No results for input(s): HGBA1C in the last 72 hours. CBG: No results for input(s): GLUCAP in the last 168 hours. Lipid Profile: Recent Labs    10/15/18 0521  TRIG 108   Thyroid Function Tests: No results for input(s): TSH,  T4TOTAL, FREET4, T3FREE, THYROIDAB in the last 72 hours. Anemia Panel: Recent Labs    10/15/18 0521  FERRITIN 686*   Urine analysis: No results found for: COLORURINE, APPEARANCEUR, LABSPEC, PHURINE, GLUCOSEU, HGBUR, BILIRUBINUR, KETONESUR, PROTEINUR, UROBILINOGEN, NITRITE, LEUKOCYTESUR  Recent Results (from the past 240 hour(s))  SARS Coronavirus 2 (CEPHEID- Performed in [place] hospital lab), Hosp Order     Status: Abnormal   Collection Time: 10/15/18  5:21 AM   Specimen: Nasopharyngeal Swab  Result Value Ref Range Status   SARS Coronavirus 2 POSITIVE (A) NEGATIVE Final    Comment: RESULT CALLED TO, READ BACK BY AND VERIFIED WITH: JEN DALEY ON 10/15/2018 AT 6144 QSD (NOTE) If result is NEGATIVE SARS-CoV-2 target nucleic acids are NOT DETECTED. The SARS-CoV-2 RNA is generally detectable in upper and lower  respiratory specimens during the acute phase of infection. The lowest  concentration of SARS-CoV-2  viral copies this assay can detect is 250  copies / mL. A negative result does not preclude SARS-CoV-2 infection  and should not be used as the sole basis for treatment or other  patient management decisions.  A negative result may occur with  improper specimen collection / handling, submission of specimen other  than nasopharyngeal swab, presence of viral mutation(s) within the  areas targeted by this assay, and inadequate number of viral copies  (<250 copies / mL). A negative result must be combined with clinical  observations, patient history, and epidemiological information. If result is POSITIVE SARS-CoV-2 target nucleic acids are DETECTED. Th e SARS-CoV-2 RNA is generally detectable in upper and lower  respiratory specimens during the acute phase of infection.  Positive  results are indicative of active infection with SARS-CoV-2.  Clinical  correlation with patient history and other diagnostic information is  necessary to determine patient infection status.  Positive results do  not rule out bacterial infection or co-infection with other viruses. If result is PRESUMPTIVE POSTIVE SARS-CoV-2 nucleic acids MAY BE PRESENT.   A presumptive positive result was obtained on the submitted specimen  and confirmed on repeat testing.  While 2019 novel coronavirus  (SARS-CoV-2) nucleic acids may be present in the submitted sample  additional confirmatory testing may be necessary for epidemiological  and / or clinical management purposes  to differentiate between  SARS-CoV-2 and other Sarbecovirus currently known to infect humans.  If clinically indicated additional testing with an alternate test  methodology [phone]) is  advised. The SARS-CoV-2 RNA is generally  detectable in upper and lower respiratory specimens during the acute  phase of infection. The expected result is Negative. Fact Sheet for Patients:  StrictlyIdeas.no Fact Sheet for Healthcare Providers:  BankingDealers.co.za This test is not yet approved or cleared by the [place] FDA and has been authorized for detection and/or diagnosis of SARS-CoV-2 by FDA under an Emergency Use Authorization (EUA).  This EUA will remain in effect (meaning this test can be used) for the duration of the COVID-19 declaration under Section 564(b)(1) of the Act, 21 U.S.C. section 360bbb-3(b)(1), unless the authorization is terminated or revoked sooner. Performed at [facility], [address] [place], [place] [address]      Radiological Exams on Admission: Dg [facility] 1 View  Result Date: 10/15/2018 CLINICAL DATA:  Shortness of breath.  COVID-19. EXAM: PORTABLE CHEST 1 VIEW COMPARISON:  03/17/2018 FINDINGS: Normal heart size and mediastinal contours. Patchy bilateral infiltrate correlating with the history. No effusion or pneumothorax. IMPRESSION: Bilateral pneumonia correlating with history of COVID-19. Electronically Signed  By: Monte Fantasia M.D.   On: 10/15/2018 05:52    EKG: Independently reviewed. NSR, vent rate 91bpm, leftward axis without ST elevation or depression.   Assessment/Plan Principal Problem:   Acute respiratory disease due to COVID-19 virus Active Problems:   HTN (hypertension)   Acute respiratory failure with hypoxia (HCC)   T2DM (type 2 diabetes mellitus) (HCC)   Obesity, Class III, BMI 40-49.9 (morbid obesity) ([place])   Acute hypoxic respiratory failure due to covid-19 pneumonia:  - Start remdesivir x5 days.  - Start IV solumedrol 60mg  q8h  - Give tocilizumab 800mg  IV x1 and monitor. The off-label nature of this medication, it's risks, potential benefits, and alternatives were discussed in detail. Will monitor LFTs and for secondary infections.  - Continue airborne, contact precautions. PPE including surgical gown, gloves, face shield, cap, shoe covers, and N-95 used during this encounter in a negative pressure room.  - Check daily  labs: CBC w/diff, CMP, d-dimer, fibrinogen, ferritin, LDH, CRP - Maintain euvolemia/net negative.  - Avoid NSAIDs - Recommend proning and aggressive use of incentive spirometry.  Sepsis due to covid-19: Lactic acid wnl - Follow cultures - PCT undetectable, will stop azithromycin, ceftriaxone  Hypokalemia: K 2.7.  - Replace and recheck in AM with Mg.  Elevated LFTs: Mild in 60's, bili wnl. Due to viral infection most likely.  - Monitor. Still candidate for remdesivir and actemra.   HTN:  - Await pharmacy medication reconcilitation and treat as indicated.   NIDT2DM: Has only been on medications for a few months. - Anticipate hyperglycemia with steroids and acute illness. Will start with moderate SSI AC/HS and titrate as needed.   Morbid obesity:  - Weight loss recommended long term.  DVT prophylaxis: Lovenox 0.5mg /kg daily  Code Status: Full  Family Communication: None at bedside. Spouse and step son are also sick with covid. Disposition Plan: Uncertain.  Consults called: None  Admission status: Inpatient    Patrecia Pour, MD [facility] [email or web address] Password [facility] 10/15/2018, 2:41 PM

## 2018-10-15 NOTE — ED Notes (Signed)
[phone] daughter given update

## 2018-10-15 NOTE — Progress Notes (Signed)
Pharmacy Brief Note  O:  ALT: 65 CXR: B/L PNA SpO2: 98% on 2.5L [place]  A/P:  Patient is a candidiate for remdesivir. Will order remdesivir 200 mg iv once followed by 100 mg iv daily x 4 days. Will monitor ALT.   Ulice Dash, PharmD, BCPS Clinical Pharmacist

## 2018-10-16 DIAGNOSIS — U071 COVID-19: Secondary | ICD-10-CM

## 2018-10-16 HISTORY — DX: COVID-19: U07.1

## 2018-10-16 LAB — COMPREHENSIVE METABOLIC PANEL
ALT: 66 U/L — ABNORMAL HIGH (ref 0–44)
AST: 41 U/L (ref 15–41)
Albumin: 3.2 g/dL — ABNORMAL LOW (ref 3.5–5.0)
Alkaline Phosphatase: 70 U/L (ref 38–126)
Anion gap: 11 (ref 5–15)
BUN: 14 mg/dL (ref 6–20)
CO2: 26 mmol/L (ref 22–32)
Calcium: 8.7 mg/dL — ABNORMAL LOW (ref 8.9–10.3)
Chloride: 101 mmol/L (ref 98–111)
Creatinine, Ser: 0.68 mg/dL (ref 0.44–1.00)
GFR calc Af Amer: >60 mL/min (ref >60)
GFR calc non Af Amer: >60 mL/min (ref >60)
Glucose, Bld: 276 mg/dL — ABNORMAL HIGH (ref 70–99)
Potassium: 4.3 mmol/L (ref 3.5–5.1)
Sodium: 138 mmol/L (ref 135–145)
Total Bilirubin: 0.3 mg/dL (ref 0.3–1.2)
Total Protein: 7.6 g/dL (ref 6.5–8.1)

## 2018-10-16 LAB — CBC WITH DIFFERENTIAL/PLATELET
Abs Immature Granulocytes: 0.03 10*3/uL (ref 0.00–0.07)
Basophils Absolute: 0 10*3/uL (ref 0.0–0.1)
Basophils Relative: 0 %
Eosinophils Absolute: 0 10*3/uL (ref 0.0–0.5)
Eosinophils Relative: 0 %
HCT: 39.2 % (ref 36.0–46.0)
Hemoglobin: 12.1 g/dL (ref 12.0–15.0)
Immature Granulocytes: 1 %
Lymphocytes Relative: 25 %
Lymphs Abs: 0.8 10*3/uL (ref 0.7–4.0)
MCH: 25.7 pg — ABNORMAL LOW (ref 26.0–34.0)
MCHC: 30.9 g/dL (ref 30.0–36.0)
MCV: 83.2 fL (ref 80.0–100.0)
Monocytes Absolute: 0.2 10*3/uL (ref 0.1–1.0)
Monocytes Relative: 5 %
Neutro Abs: 2.2 10*3/uL (ref 1.7–7.7)
Neutrophils Relative %: 69 %
Platelets: 419 10*3/uL — ABNORMAL HIGH (ref 150–400)
RBC: 4.71 MIL/uL (ref 3.87–5.11)
RDW: 13.2 % (ref 11.5–15.5)
WBC: 3.2 10*3/uL — ABNORMAL LOW (ref 4.0–10.5)
nRBC: 0 % (ref 0.0–0.2)

## 2018-10-16 LAB — GLUCOSE, CAPILLARY
Glucose-Capillary: 257 mg/dL — ABNORMAL HIGH (ref 70–99)
Glucose-Capillary: 380 mg/dL — ABNORMAL HIGH (ref 70–99)
Glucose-Capillary: 334 mg/dL — ABNORMAL HIGH (ref 70–99)
Glucose-Capillary: 332 mg/dL — ABNORMAL HIGH (ref 70–99)

## 2018-10-16 LAB — ABO/RH: ABO/RH(D): A POS

## 2018-10-16 LAB — HIV ANTIBODY (ROUTINE TESTING W REFLEX): HIV Screen 4th Generation wRfx: NONREACTIVE

## 2018-10-16 LAB — FERRITIN: Ferritin: 639 ng/mL — ABNORMAL HIGH (ref 11–307)

## 2018-10-16 LAB — C-REACTIVE PROTEIN: CRP: 24 mg/dL — ABNORMAL HIGH (ref <1.0)

## 2018-10-16 LAB — MAGNESIUM: Magnesium: 2.4 mg/dL (ref 1.7–2.4)

## 2018-10-16 LAB — D-DIMER, QUANTITATIVE: D-Dimer, Quant: 1.18 ug/mL-FEU — ABNORMAL HIGH (ref 0.00–0.50)

## 2018-10-16 MED ORDER — INSULIN DETEMIR 100 UNIT/ML ~~LOC~~ SOLN
6.0000 [IU] | Freq: Two times a day (BID) | SUBCUTANEOUS | Status: DC
  Administered 2018-10-16 (×2): 6 [IU] via SUBCUTANEOUS
  Filled 2018-10-16 (×3): qty 0.06

## 2018-10-16 MED ORDER — PRO-STAT SUGAR FREE PO LIQD
30.0000 mL | Freq: Three times a day (TID) | ORAL | Status: DC
  Administered 2018-10-16 – 2018-10-18 (×7): 30 mL via ORAL
  Filled 2018-10-16 (×6): qty 30

## 2018-10-16 MED ORDER — INSULIN ASPART 100 UNIT/ML ~~LOC~~ SOLN
0.0000 [IU] | Freq: Three times a day (TID) | SUBCUTANEOUS | Status: DC
  Administered 2018-10-16: 7 [IU] via SUBCUTANEOUS
  Administered 2018-10-16: 20 [IU] via SUBCUTANEOUS
  Administered 2018-10-16 – 2018-10-17 (×4): 15 [IU] via SUBCUTANEOUS
  Administered 2018-10-18: 7 [IU] via SUBCUTANEOUS
  Administered 2018-10-18 (×2): 15 [IU] via SUBCUTANEOUS
  Administered 2018-10-19 (×2): 3 [IU] via SUBCUTANEOUS

## 2018-10-16 MED ORDER — INSULIN ASPART 100 UNIT/ML ~~LOC~~ SOLN
0.0000 [IU] | Freq: Every day | SUBCUTANEOUS | Status: DC
  Administered 2018-10-16: 4 [IU] via SUBCUTANEOUS
  Administered 2018-10-17: 3 [IU] via SUBCUTANEOUS
  Administered 2018-10-18: 2 [IU] via SUBCUTANEOUS

## 2018-10-16 NOTE — Progress Notes (Signed)
Initial Nutrition Assessment  RD working remotely.  DOCUMENTATION CODES:   Morbid obesity  INTERVENTION:   30 ml Prostat TID, each supplement provides 100 kcals and 15 grams protein.    NUTRITION DIAGNOSIS:   Inadequate oral intake related to acute illness as evidenced by per patient/family report.  GOAL:   Patient will meet greater than or equal to 90% of their needs  MONITOR:   PO intake, Supplement acceptance, Labs, Weight trends  REASON FOR ASSESSMENT:   Malnutrition Screening Tool    ASSESSMENT:   53 yo female with PMH of HTN, DM and obesity admitted with acute respiratory failure and sepsis due to COVID-19 virus.  Pt on 2L [place] Recorded po intake 100% of meal  Reviewed weight encounters; no significant wt loss trend per weight encounters as far back as 2014.  Current wt 135.4 kg  Labs: CBGs 241-380 Meds: Vitamin C, Zinc sulfate, solumedrol, levemir BID, ss novolog   NUTRITION - FOCUSED PHYSICAL EXAM:  Unable to assess, working remotely  Diet Order:   Diet Order            Diet Carb Modified Fluid consistency: Thin; Room service appropriate? Yes  Diet effective now              EDUCATION NEEDS:   Not appropriate for education at this time  Skin:  Skin Assessment: Reviewed RN Assessment  Last BM:  no documented BM  Height:   Ht Readings from Last 1 Encounters:  10/15/18 5\' 6"  (1.676 m)    Weight:   Wt Readings from Last 1 Encounters:  10/15/18 135.4 kg    Ideal Body Weight:  59.1 kg  BMI:  Body mass index is 48.2 kg/m.  Estimated Nutritional Needs:   Kcal:  2000-2200 kcals  Protein:  125-138 g  Fluid:  >/= 2 L   Cate Dominik Yordy MS, RDN, LDN, CNSC [phone] Pager  [phone] Weekend/On-Call Pager

## 2018-10-16 NOTE — Progress Notes (Signed)
PROGRESS NOTE  [name]  OFB:[id number] DOB: [birth year] DOA: 10/15/2018 PCP: Casilda Carls, MD   Brief Narrative: Paula Flores is a 53 y.o. female with a history of HTN, T2DM, and obesity who presented 6/24 with cough, weakness, fever and worsening shortness of breath having tested positive for coronavirus on 6/13. She presented to ED via EMS early 6/24 due to worsening shortness of breath found to be hypoxic with bilateral CXR infiltrates and confirmed positive coronavirus testing. Admission to [facility] requested. Steroids given, abx given, cultures taken. Hemodynamically stable and satting in mid-low 90%'s on 2.5L O2. On arrival at [facility] she states her shortness of breath is getting worse and is worse with exertion or with prolonged speaking. Remdesivir was started and a dose of tocilizumab administered.  Assessment & Plan: Principal Problem:   Acute respiratory disease due to COVID-19 virus Active Problems:   HTN (hypertension)   Acute respiratory failure with hypoxia (HCC)   T2DM (type 2 diabetes mellitus) (HCC)   Obesity, Class III, BMI 40-49.9 (morbid obesity) ([place])  Acute hypoxic respiratory failure due to covid-19 pneumonia:  - Continue remdesivir x5 days (6/24 - 6/28) - Continue solumedrol 60mg  q8h  - Gave tocilizumab 6/24. Monitor LFTs and for secondary infections.  - Continue airborne, contact precautions. PPE including surgical gown, gloves, face shield, cap, shoe covers, and N-95 used during this encounter in a negative pressure room.  - Check daily labs: CBC w/diff, CMP, d-dimer, fibrinogen, ferritin, LDH, CRP - Maintain euvolemia/net negative.  - Avoid NSAIDs - Vitamin C, zinc - Recommend proning and aggressive use of incentive spirometry.  Sepsis due to covid-19: Lactic acid wnl. PCT undetectable, stopped azithromycin, ceftriaxone - Follow cultures  Hypokalemia: K 2.7.  - Replaced, resolved.   Elevated LFTs: Mild in 60's, bili wnl. Due to viral infection  most likely.  - Monitor. Still candidate for remdesivir and actemra.   HTN:  - Await pharmacy medication reconcilitation and treat as indicated.   NIDT2DM: Has only been on medications for a few months. - Augment to resistant SSI, add levemir 6u BID for now.    Morbid obesity: BMI 48. - Weight loss recommended long term.  DVT prophylaxis: Lovenox 70mg  q24h Code Status: Full Family Communication: Daughter by phone Disposition Plan: Home once clinically improved.  Consultants:   None  Procedures:   None  Antimicrobials:  Remdesivir 6/24 - 6/28  Azithromycin, ceftriaxone 6/24   Subjective: Paula Flores #[id number], Spanish video interpretor used throughout encounter. Patient's shortness of breath is stable, slightly better, still dyspneic with any exertion and speaking longer sentences. No chest pain. Cough is stable. Eating ok.    Objective: Vitals:   10/15/18 1712 10/15/18 2112 10/16/18 0418 10/16/18 0725  BP: 134/89 (!) 136/107 124/81 135/78  Pulse: 74  72 70  Resp: 16 18 18 16   Temp: 98.3 F (36.8 C) 98.9 F (37.2 C) 98.1 F (36.7 C) 98.2 F (36.8 C)  TempSrc: Oral Oral Oral Oral  SpO2: 95% 95% 96% 97%  Weight:      Height:        Intake/Output Summary (Last 24 hours) at 10/16/2018 1303 Last data filed at 10/16/2018 0949 Gross per 24 hour  Intake 940.69 ml  Output -  Net 940.69 ml   Filed Weights   10/15/18 1352 10/15/18 1500  Weight: 135.4 kg 135.4 kg    Gen: Pleasant, obese female in no distress  Pulm: Non-labored breathing at rest, speaking short sentences. Clear to auscultation bilaterally.  CV: Regular  rate and rhythm. No murmur, rub, or gallop. No JVD, no pedal edema. GI: Abdomen soft, non-tender, non-distended, with normoactive bowel sounds. No organomegaly or masses felt. Ext: Warm, no deformities Skin: No rashes, lesions or ulcers Neuro: Alert and oriented. No focal neurological deficits. Psych: Judgement and insight appear normal. Mood & affect  appropriate.   Data Reviewed: I have personally reviewed following labs and imaging studies  CBC: Recent Labs  Lab 10/15/18 0521 10/16/18 0353  WBC 6.3 3.2*  NEUTROABS 4.8 2.2  HGB 12.6 12.1  HCT 38.7 39.2  MCV 80.6 83.2  PLT 379 161*   Basic Metabolic Panel: Recent Labs  Lab 10/15/18 0521 10/16/18 0353  NA 141 138  K 2.7* 4.3  CL 99 101  CO2 30 26  GLUCOSE 168* 276*  BUN 8 14  CREATININE 0.75 0.68  CALCIUM 8.6* 8.7*  MG  --  2.4   GFR: Estimated Creatinine Clearance: 117.8 mL/min (by C-G formula based on SCr of 0.68 mg/dL). Liver Function Tests: Recent Labs  Lab 10/15/18 0521 10/16/18 0353  AST 64* 41  ALT 65* 66*  ALKPHOS 67 70  BILITOT 0.7 0.3  PROT 7.6 7.6  ALBUMIN 3.3* 3.2*   No results for input(s): LIPASE, AMYLASE in the last 168 hours. No results for input(s): AMMONIA in the last 168 hours. Coagulation Profile: No results for input(s): INR, PROTIME in the last 168 hours. Cardiac Enzymes: No results for input(s): CKTOTAL, CKMB, CKMBINDEX, TROPONINI in the last 168 hours. BNP (last 3 results) No results for input(s): PROBNP in the last 8760 hours. HbA1C: No results for input(s): HGBA1C in the last 72 hours. CBG: Recent Labs  Lab 10/15/18 1710 10/15/18 2118 10/16/18 0723  GLUCAP 241* 271* 257*   Lipid Profile: Recent Labs    10/15/18 0521  TRIG 108   Thyroid Function Tests: No results for input(s): TSH, T4TOTAL, FREET4, T3FREE, THYROIDAB in the last 72 hours. Anemia Panel: Recent Labs    10/15/18 0521 10/16/18 0353  FERRITIN 686* 639*   Urine analysis: No results found for: COLORURINE, APPEARANCEUR, LABSPEC, PHURINE, GLUCOSEU, HGBUR, BILIRUBINUR, KETONESUR, PROTEINUR, UROBILINOGEN, NITRITE, LEUKOCYTESUR Recent Results (from the past 240 hour(s))  SARS Coronavirus 2 (CEPHEID- Performed in [place] hospital lab), Hosp Order     Status: Abnormal   Collection Time: 10/15/18  5:21 AM   Specimen: Nasopharyngeal Swab  Result Value  Ref Range Status   SARS Coronavirus 2 POSITIVE (A) NEGATIVE Final    Comment: RESULT CALLED TO, READ BACK BY AND VERIFIED WITH: JEN DALEY ON 10/15/2018 AT 0960 QSD (NOTE) If result is NEGATIVE SARS-CoV-2 target nucleic acids are NOT DETECTED. The SARS-CoV-2 RNA is generally detectable in upper and lower  respiratory specimens during the acute phase of infection. The lowest  concentration of SARS-CoV-2 viral copies this assay can detect is 250  copies / mL. A negative result does not preclude SARS-CoV-2 infection  and should not be used as the sole basis for treatment or other  patient management decisions.  A negative result may occur with  improper specimen collection / handling, submission of specimen other  than nasopharyngeal swab, presence of viral mutation(s) within the  areas targeted by this assay, and inadequate number of viral copies  (<250 copies / mL). A negative result must be combined with clinical  observations, patient history, and epidemiological information. If result is POSITIVE SARS-CoV-2 target nucleic acids are DETECTED. Th e SARS-CoV-2 RNA is generally detectable in upper and lower  respiratory specimens during  the acute phase of infection.  Positive  results are indicative of active infection with SARS-CoV-2.  Clinical  correlation with patient history and other diagnostic information is  necessary to determine patient infection status.  Positive results do  not rule out bacterial infection or co-infection with other viruses. If result is PRESUMPTIVE POSTIVE SARS-CoV-2 nucleic acids MAY BE PRESENT.   A presumptive positive result was obtained on the submitted specimen  and confirmed on repeat testing.  While 2019 novel coronavirus  (SARS-CoV-2) nucleic acids may be present in the submitted sample  additional confirmatory testing may be necessary for epidemiological  and / or clinical management purposes  to differentiate between  SARS-CoV-2 and other  Sarbecovirus currently known to infect humans.  If clinically indicated additional testing with an alternate test  methodology [id number]) is  advised. The SARS-CoV-2 RNA is generally  detectable in upper and lower respiratory specimens during the acute  phase of infection. The expected result is Negative. Fact Sheet for Patients:  StrictlyIdeas.no Fact Sheet for Healthcare Providers: BankingDealers.co.za This test is not yet approved or cleared by the [place] FDA and has been authorized for detection and/or diagnosis of SARS-CoV-2 by FDA under an Emergency Use Authorization (EUA).  This EUA will remain in effect (meaning this test can be used) for the duration of the COVID-19 declaration under Section 564(b)(1) of the Act, 21 U.S.C. section 360bbb-3(b)(1), unless the authorization is terminated or revoked sooner. Performed at [facility], [address] [place], [place] [address]       Radiology Studies: Dg Chest [place] 1 View  Result Date: 10/15/2018 CLINICAL DATA:  Shortness of breath.  COVID-19. EXAM: PORTABLE CHEST 1 VIEW COMPARISON:  03/17/2018 FINDINGS: Normal heart size and mediastinal contours. Patchy bilateral infiltrate correlating with the history. No effusion or pneumothorax. IMPRESSION: Bilateral pneumonia correlating with history of COVID-19. Electronically Signed   By: Monte Fantasia M.D.   On: 10/15/2018 05:52    Scheduled Meds: . enoxaparin (LOVENOX) injection  0.5 mg/kg Subcutaneous Q24H  . insulin aspart  0-20 Units Subcutaneous TID WC  . insulin aspart  0-5 Units Subcutaneous QHS  . methylPREDNISolone (SOLU-MEDROL) injection  60 mg Intravenous Q8H  . sodium chloride flush  3 mL Intravenous Q12H  . vitamin C  500 mg Oral Daily  . zinc sulfate  220 mg Oral Daily   Continuous Infusions: . remdesivir 100 mg in NS 250 mL       LOS: 1 day   Time spent: 35 minutes.  Patrecia Pour, MD [facility] [email or web address] Password [email or web address] 10/16/2018, 1:03 PM

## 2018-10-16 NOTE — Progress Notes (Addendum)
Inpatient Diabetes Program Recommendations  AACE/ADA: New Consensus Statement on Inpatient Glycemic Control (2015)  Target Ranges:  Prepandial:   less than 140 mg/dL      Peak postprandial:   less than 180 mg/dL (1-2 hours)      Critically ill patients:  140 - 180 mg/dL   Results for Paula Flores, Paula Flores (MRN [id number]) as of 10/16/2018 09:52  Ref. Range 10/15/2018 17:10 10/15/2018 21:18 10/16/2018 07:23  Glucose-Capillary Latest Ref Range: 70 - 99 mg/dL 241 (H) 271 (H) 257 (H)   Review of Glycemic Control  Diabetes history: DM 2 Outpatient Diabetes medications: Glipizide 5 mg Daily, Metformin 500 mg bid Current orders for Inpatient glycemic control: Novolog 0-20 units tid, Novolog 0-5 units qhs  Inpatient Diabetes Program Recommendations:    Patient on IV Solumedrol 60 mg Q8 hours.  Glucose trends in the mid 200 range on current insulin regimen. Consider Levemir 6 units bid.  Thanks,  Tama Headings RN, MSN, BC-ADM Inpatient Diabetes Coordinator Team Pager [phone] (8a-5p)

## 2018-10-17 LAB — CBC WITH DIFFERENTIAL/PLATELET
Abs Immature Granulocytes: 0.07 10*3/uL (ref 0.00–0.07)
Basophils Absolute: 0 10*3/uL (ref 0.0–0.1)
Basophils Relative: 0 %
Eosinophils Absolute: 0 10*3/uL (ref 0.0–0.5)
Eosinophils Relative: 0 %
HCT: 37.8 % (ref 36.0–46.0)
Hemoglobin: 11.9 g/dL — ABNORMAL LOW (ref 12.0–15.0)
Immature Granulocytes: 1 %
Lymphocytes Relative: 11 %
Lymphs Abs: 0.9 10*3/uL (ref 0.7–4.0)
MCH: 26.3 pg (ref 26.0–34.0)
MCHC: 31.5 g/dL (ref 30.0–36.0)
MCV: 83.6 fL (ref 80.0–100.0)
Monocytes Absolute: 0.3 10*3/uL (ref 0.1–1.0)
Monocytes Relative: 4 %
Neutro Abs: 7.2 10*3/uL (ref 1.7–7.7)
Neutrophils Relative %: 84 %
Platelets: 459 10*3/uL — ABNORMAL HIGH (ref 150–400)
RBC: 4.52 MIL/uL (ref 3.87–5.11)
RDW: 13.2 % (ref 11.5–15.5)
WBC: 8.5 10*3/uL (ref 4.0–10.5)
nRBC: 0 % (ref 0.0–0.2)

## 2018-10-17 LAB — COMPREHENSIVE METABOLIC PANEL
ALT: 56 U/L — ABNORMAL HIGH (ref 0–44)
AST: 25 U/L (ref 15–41)
Albumin: 3 g/dL — ABNORMAL LOW (ref 3.5–5.0)
Alkaline Phosphatase: 59 U/L (ref 38–126)
Anion gap: 10 (ref 5–15)
BUN: 25 mg/dL — ABNORMAL HIGH (ref 6–20)
CO2: 25 mmol/L (ref 22–32)
Calcium: 8.8 mg/dL — ABNORMAL LOW (ref 8.9–10.3)
Chloride: 105 mmol/L (ref 98–111)
Creatinine, Ser: 0.68 mg/dL (ref 0.44–1.00)
GFR calc Af Amer: >60 mL/min (ref >60)
GFR calc non Af Amer: >60 mL/min (ref >60)
Glucose, Bld: 283 mg/dL — ABNORMAL HIGH (ref 70–99)
Potassium: 4.5 mmol/L (ref 3.5–5.1)
Sodium: 140 mmol/L (ref 135–145)
Total Bilirubin: 0.2 mg/dL — ABNORMAL LOW (ref 0.3–1.2)
Total Protein: 7 g/dL (ref 6.5–8.1)

## 2018-10-17 LAB — D-DIMER, QUANTITATIVE: D-Dimer, Quant: 0.64 ug/mL-FEU — ABNORMAL HIGH (ref 0.00–0.50)

## 2018-10-17 LAB — C-REACTIVE PROTEIN: CRP: 11.2 mg/dL — ABNORMAL HIGH (ref <1.0)

## 2018-10-17 LAB — GLUCOSE, CAPILLARY
Glucose-Capillary: 263 mg/dL — ABNORMAL HIGH (ref 70–99)
Glucose-Capillary: 307 mg/dL — ABNORMAL HIGH (ref 70–99)
Glucose-Capillary: 328 mg/dL — ABNORMAL HIGH (ref 70–99)
Glucose-Capillary: 348 mg/dL — ABNORMAL HIGH (ref 70–99)
Glucose-Capillary: 272 mg/dL — ABNORMAL HIGH (ref 70–99)

## 2018-10-17 LAB — FERRITIN: Ferritin: 574 ng/mL — ABNORMAL HIGH (ref 11–307)

## 2018-10-17 MED ORDER — TRAZODONE HCL 50 MG PO TABS
50.0000 mg | ORAL_TABLET | Freq: Every evening | ORAL | Status: DC | PRN
  Administered 2018-10-17: 50 mg via ORAL
  Filled 2018-10-17: qty 1

## 2018-10-17 MED ORDER — INSULIN ASPART 100 UNIT/ML ~~LOC~~ SOLN
3.0000 [IU] | Freq: Three times a day (TID) | SUBCUTANEOUS | Status: DC
  Administered 2018-10-17 (×2): 3 [IU] via SUBCUTANEOUS

## 2018-10-17 MED ORDER — SENNA 8.6 MG PO TABS
1.0000 | ORAL_TABLET | Freq: Every day | ORAL | Status: DC
  Administered 2018-10-17 – 2018-10-18 (×2): 8.6 mg via ORAL
  Filled 2018-10-17 (×3): qty 1

## 2018-10-17 MED ORDER — INSULIN DETEMIR 100 UNIT/ML ~~LOC~~ SOLN
15.0000 [IU] | Freq: Two times a day (BID) | SUBCUTANEOUS | Status: DC
  Administered 2018-10-17: 15 [IU] via SUBCUTANEOUS
  Filled 2018-10-17 (×2): qty 0.15

## 2018-10-17 MED ORDER — INSULIN DETEMIR 100 UNIT/ML ~~LOC~~ SOLN
20.0000 [IU] | Freq: Two times a day (BID) | SUBCUTANEOUS | Status: DC
  Administered 2018-10-17 – 2018-10-19 (×4): 20 [IU] via SUBCUTANEOUS
  Filled 2018-10-17 (×5): qty 0.2

## 2018-10-17 MED ORDER — POLYETHYLENE GLYCOL 3350 17 G PO PACK
17.0000 g | PACK | Freq: Every day | ORAL | Status: DC
  Administered 2018-10-17 – 2018-10-18 (×2): 17 g via ORAL
  Filled 2018-10-17 (×3): qty 1

## 2018-10-17 MED ORDER — INSULIN ASPART 100 UNIT/ML ~~LOC~~ SOLN
6.0000 [IU] | Freq: Three times a day (TID) | SUBCUTANEOUS | Status: DC
  Administered 2018-10-17 – 2018-10-19 (×6): 6 [IU] via SUBCUTANEOUS

## 2018-10-17 NOTE — Plan of Care (Signed)
  Problem: Education: Goal: Knowledge of risk factors and measures for prevention of condition will improve Outcome: Progressing   Problem: Respiratory: Goal: Will maintain a patent airway Outcome: Progressing   Problem: Education: Goal: Knowledge of risk factors and measures for prevention of condition will improve Outcome: Progressing   

## 2018-10-17 NOTE — Evaluation (Signed)
Physical Therapy Evaluation Patient Details Name: Paula Flores MRN: [id number] DOB: 07-15-1965 Today's Date: 10/17/2018   History of Present Illness  Pt is a 53 y.o. female admitted 10/15/18 with cough, weakness, fever and SOB; pt initially tested positive for COVID-19 on 10/04/18. PMH includes HTN, DM2, obesity.    Clinical Impression  Pt presents with an overall decrease in functional mobility secondary to above. PTA, pt indep and lives with family. Today, pt able to ambulate in hallway with SpO2 88-100% on RA; 2x standing rest breaks due to fatigue and DOE. SpO2 94% on RA when returning to room, pt left seated in recliner on RA (RN aware). Pt would benefit from continued acute PT services to maximize functional mobility and independence prior to d/c home.     Follow Up Recommendations No PT follow up    Equipment Recommendations  None recommended by PT    Recommendations for Other Services       Precautions / Restrictions Precautions Precautions: Fall Restrictions Weight Bearing Restrictions: No      Mobility  Bed Mobility               General bed mobility comments: Received sitting in recliner  Transfers Overall transfer level: Independent Equipment used: None                Ambulation/Gait Ambulation/Gait assistance: Supervision Gait Distance (Feet): 300 Feet Assistive device: None Gait Pattern/deviations: Step-through pattern;Decreased stride length Gait velocity: Decreased Gait velocity interpretation: <1.8 ft/sec, indicate of risk for recurrent falls General Gait Details: Slow, steady ambulation without device; 2x standing rest breaks due to fatigue and DOE 3/4. Supervision for intermittent cues to monitor activity tolerance and take standing rest breaks. SpO2 88-100% on RA  Stairs            Wheelchair Mobility    Modified Rankin (Stroke Patients Only)       Balance Overall balance assessment: Needs assistance   Sitting  balance-Leahy Scale: Good       Standing balance-Leahy Scale: Fair                               Pertinent Vitals/Pain Pain Assessment: No/denies pain    Home Living Family/patient expects to be discharged to:: Private residence Living Arrangements: Spouse/significant other;Children Available Help at Discharge: Family;Available 24 hours/day Type of Home: House Home Access: Stairs to enter     Home Layout: One level Home Equipment: None      Prior Function Level of Independence: Independent               Hand Dominance        Extremity/Trunk Assessment   Upper Extremity Assessment Upper Extremity Assessment: Overall WFL for tasks assessed    Lower Extremity Assessment Lower Extremity Assessment: Overall WFL for tasks assessed       Communication   Communication: No difficulties  Cognition Arousal/Alertness: Awake/alert Behavior During Therapy: WFL for tasks assessed/performed Overall Cognitive Status: Within Functional Limits for tasks assessed                                        General Comments      Exercises     Assessment/Plan    PT Assessment Patient needs continued PT services  PT Problem List Decreased activity tolerance;Decreased balance;Decreased mobility;Cardiopulmonary status limiting activity  PT Treatment Interventions DME instruction;Gait training;Stair training;Functional mobility training;Therapeutic activities;Therapeutic exercise;Balance training;Patient/family education    PT Goals (Current goals can be found in the Care Plan section)  Acute Rehab PT Goals Patient Stated Goal: Return home to family PT Goal Formulation: With patient Time For Goal Achievement: 10/31/18 Potential to Achieve Goals: Good    Frequency Min 3X/week   Barriers to discharge        Co-evaluation               AM-PAC PT "6 Clicks" Mobility  Outcome Measure Help needed turning from your back to your  side while in a flat bed without using bedrails?: None Help needed moving from lying on your back to sitting on the side of a flat bed without using bedrails?: None Help needed moving to and from a bed to a chair (including a wheelchair)?: None Help needed standing up from a chair using your arms (e.g., wheelchair or bedside chair)?: None Help needed to walk in hospital room?: A Little Help needed climbing 3-5 steps with a railing? : A Little 6 Click Score: 22    End of Session   Activity Tolerance: Patient tolerated treatment well Patient left: in chair;with call bell/phone within reach Nurse Communication: Mobility status PT Visit Diagnosis: Other abnormalities of gait and mobility (R26.89)    Time: 0277-4128 PT Time Calculation (min) (ACUTE ONLY): 19 min   Charges:   PT Evaluation $PT Eval Moderate Complexity: [place], PT, DPT Acute Rehabilitation Services  Pager [phone] Office [place] 10/17/2018, 2:10 PM

## 2018-10-17 NOTE — Progress Notes (Signed)
PROGRESS NOTE  Paula Flores  VPX:[id number] DOB: Jul 16, 1965 DOA: 10/15/2018 PCP: Casilda Carls, MD   Brief Narrative: Paula Flores is a 53 y.o. female with a history of HTN, T2DM, and obesity who presented 6/24 with cough, weakness, fever and worsening shortness of breath having tested positive for coronavirus on 6/13. She presented to ED via EMS early 6/24 due to worsening shortness of breath found to be hypoxic with bilateral CXR infiltrates and confirmed positive coronavirus testing. Admission to [facility] requested. Steroids given, abx given, cultures taken. Hemodynamically stable and satting in mid-low 90%'s on 2.5L O2. On arrival at [facility] she states her shortness of breath is getting worse and is worse with exertion or with prolonged speaking. Remdesivir was started and a dose of tocilizumab administered.  Assessment & Plan: Principal Problem:   Acute respiratory disease due to COVID-19 virus Active Problems:   HTN (hypertension)   Acute respiratory failure with hypoxia (HCC)   T2DM (type 2 diabetes mellitus) (HCC)   Obesity, Class III, BMI 40-49.9 (morbid obesity) ([place])  Acute hypoxic respiratory failure due to covid-19 pneumonia:  - Continue remdesivir x5 days (6/24 - 6/28) - Continue solumedrol 60mg  q8h  - Gave tocilizumab 6/24. Monitor LFTs (ALT mildly elevated and decreased 6/26) and for secondary infections.  - Continue airborne, contact precautions. PPE including surgical gown, gloves, face shield, cap, shoe covers, and N-95 used during this encounter in a negative pressure room.  - Check daily inflammation markers, blood counts, CMP. CRP declining nicely 24 >> 11.  - Maintain euvolemia/net negative.  - Avoid NSAIDs - Vitamin C, zinc - Recommend proning and aggressive use of incentive spirometry.  Sepsis due to covid-19: Lactic acid wnl. PCT undetectable, stopped azithromycin, ceftriaxone - Follow cultures  Hypokalemia: K 2.7.  - Replaced, resolved.   Elevated  LFTs: Mild, bili wnl. Due to viral infection most likely.  - Monitor. Still candidate for remdesivir and actemra.   HTN:  - Pt had stopped taking norvasc, thiazide and beta blocker due to improvement in BP. Currently not on these medications and normotensive, will monitor off medications for now.   NIDT2DM: Has only been on medications for a few months. - Augmented to resistant SSI, will add mealtime insulin. Required 58u insulin over past 24 hours, added levemir 6u BID will increase to 15u.    Morbid obesity: BMI 48. - Weight loss recommended long term.  Thrombocytosis: Reactive.  - Monitor intermittently.  Constipation:  - Miralax, senna.   DVT prophylaxis: Lovenox 70mg  q24h Code Status: Full Family Communication: Daughter by phone Disposition Plan: Home once clinically improved. Possibly 6/28 depending on hypoxia/dyspnea as remdesivir course is completed.   Consultants:   None  Procedures:   None  Antimicrobials:  Remdesivir 6/24 - 6/28  Azithromycin, ceftriaxone 6/24   Subjective: Spanish video interpretor used throughout encounter. Feels shortness of breath is much better, still constant, worse with speaking and exertion, now moderate, and associated with a mildly bothersome cough with whitish phlegm remains. Using IS. Willing to mobilize today. Eating well. Feels constipated, no abd pain.   Objective: Vitals:   10/16/18 0725 10/16/18 1620 10/16/18 2100 10/17/18 0448  BP: 135/78 134/85 (!) 131/94 113/78  Pulse: 70 62 62 (!) 49  Resp: 16 16 18 18   Temp: 98.2 F (36.8 C) 98.3 F (36.8 C) 98.1 F (36.7 C) 98.1 F (36.7 C)  TempSrc: Oral Oral Oral Oral  SpO2: 97% 97% 97%   Weight:      Height:  Intake/Output Summary (Last 24 hours) at 10/17/2018 0919 Last data filed at 10/16/2018 2000 Gross per 24 hour  Intake 1210 ml  Output -  Net 1210 ml   Filed Weights   10/15/18 1352 10/15/18 1500  Weight: 135.4 kg 135.4 kg   Gen: 53 y.o. female in no  distress, pleasant. Pulm: Nonlabored breathing supplemental oxygen at rest. Clear. CV: Regular rate and rhythm. No murmur, rub, or gallop. No JVD, no dependent edema. GI: Abdomen soft, non-tender, non-distended, with normoactive bowel sounds.  Ext: Warm, no deformities Skin: No rashes, lesions or ulcers on visualized skin. Neuro: Alert and oriented. No focal neurological deficits. Psych: Judgement and insight appear fair. Mood euthymic & affect congruent. Behavior is appropriate.    Data Reviewed: I have personally reviewed following labs and imaging studies  CBC: Recent Labs  Lab 10/15/18 0521 10/16/18 0353 10/17/18 0300  WBC 6.3 3.2* 8.5  NEUTROABS 4.8 2.2 7.2  HGB 12.6 12.1 11.9*  HCT 38.7 39.2 37.8  MCV 80.6 83.2 83.6  PLT 379 419* 856*   Basic Metabolic Panel: Recent Labs  Lab 10/15/18 0521 10/16/18 0353 10/17/18 0300  NA 141 138 140  K 2.7* 4.3 4.5  CL 99 101 105  CO2 30 26 25   GLUCOSE 168* 276* 283*  BUN 8 14 25*  CREATININE 0.75 0.68 0.68  CALCIUM 8.6* 8.7* 8.8*  MG  --  2.4  --    GFR: Estimated Creatinine Clearance: 117.8 mL/min (by C-G formula based on SCr of 0.68 mg/dL). Liver Function Tests: Recent Labs  Lab 10/15/18 0521 10/16/18 0353 10/17/18 0300  AST 64* 41 25  ALT 65* 66* 56*  ALKPHOS 67 70 59  BILITOT 0.7 0.3 0.2*  PROT 7.6 7.6 7.0  ALBUMIN 3.3* 3.2* 3.0*   No results for input(s): LIPASE, AMYLASE in the last 168 hours. No results for input(s): AMMONIA in the last 168 hours. Coagulation Profile: No results for input(s): INR, PROTIME in the last 168 hours. Cardiac Enzymes: No results for input(s): CKTOTAL, CKMB, CKMBINDEX, TROPONINI in the last 168 hours. BNP (last 3 results) No results for input(s): PROBNP in the last 8760 hours. HbA1C: No results for input(s): HGBA1C in the last 72 hours. CBG: Recent Labs  Lab 10/16/18 1102 10/16/18 1619 10/16/18 2109 10/17/18 0722 10/17/18 0850  GLUCAP 380* 334* 332* 263* 307*   Lipid  Profile: Recent Labs    10/15/18 0521  TRIG 108   Thyroid Function Tests: No results for input(s): TSH, T4TOTAL, FREET4, T3FREE, THYROIDAB in the last 72 hours. Anemia Panel: Recent Labs    10/16/18 0353 10/17/18 0300  FERRITIN 639* 574*   Urine analysis: No results found for: COLORURINE, APPEARANCEUR, LABSPEC, PHURINE, GLUCOSEU, HGBUR, BILIRUBINUR, KETONESUR, PROTEINUR, UROBILINOGEN, NITRITE, LEUKOCYTESUR Recent Results (from the past 240 hour(s))  Blood Culture (routine x 2)     Status: None (Preliminary result)   Collection Time: 10/15/18  5:21 AM   Specimen: BLOOD  Result Value Ref Range Status   Specimen Description BLOOD LEFT ASSIST CONTROL  Final   Special Requests   Final    BOTTLES DRAWN AEROBIC AND ANAEROBIC Blood Culture results may not be optimal due to an excessive volume of blood received in culture bottles   Culture   Final    NO GROWTH 2 DAYS Performed at [facility], [address] [place], [place] [address]    Report Status PENDING  Incomplete  SARS Coronavirus 2 (CEPHEID- Performed in [place] hospital lab), [facility]  Status: Abnormal   Collection Time: 10/15/18  5:21 AM   Specimen: Nasopharyngeal Swab  Result Value Ref Range Status   SARS Coronavirus 2 POSITIVE (A) NEGATIVE Final    Comment: RESULT CALLED TO, READ BACK BY AND VERIFIED WITH: JEN DALEY ON 10/15/2018 AT 1610 QSD (NOTE) If result is NEGATIVE SARS-CoV-2 target nucleic acids are NOT DETECTED. The SARS-CoV-2 RNA is generally detectable in upper and lower  respiratory specimens during the acute phase of infection. The lowest  concentration of SARS-CoV-2 viral copies this assay can detect is 250  copies / mL. A negative result does not preclude SARS-CoV-2 infection  and should not be used as the sole basis for treatment or other  patient management decisions.  A negative result may occur with  improper specimen collection / handling, submission of specimen other  than  nasopharyngeal swab, presence of viral mutation(s) within the  areas targeted by this assay, and inadequate number of viral copies  (<250 copies / mL). A negative result must be combined with clinical  observations, patient history, and epidemiological information. If result is POSITIVE SARS-CoV-2 target nucleic acids are DETECTED. Th e SARS-CoV-2 RNA is generally detectable in upper and lower  respiratory specimens during the acute phase of infection.  Positive  results are indicative of active infection with SARS-CoV-2.  Clinical  correlation with patient history and other diagnostic information is  necessary to determine patient infection status.  Positive results do  not rule out bacterial infection or co-infection with other viruses. If result is PRESUMPTIVE POSTIVE SARS-CoV-2 nucleic acids MAY BE PRESENT.   A presumptive positive result was obtained on the submitted specimen  and confirmed on repeat testing.  While 2019 novel coronavirus  (SARS-CoV-2) nucleic acids may be present in the submitted sample  additional confirmatory testing may be necessary for epidemiological  and / or clinical management purposes  to differentiate between  SARS-CoV-2 and other Sarbecovirus currently known to infect humans.  If clinically indicated additional testing with an alternate test  methodology [phone]) is  advised. The SARS-CoV-2 RNA is generally  detectable in upper and lower respiratory specimens during the acute  phase of infection. The expected result is Negative. Fact Sheet for Patients:  StrictlyIdeas.no Fact Sheet for Healthcare Providers: BankingDealers.co.za This test is not yet approved or cleared by the [place] FDA and has been authorized for detection and/or diagnosis of SARS-CoV-2 by FDA under an Emergency Use Authorization (EUA).  This EUA will remain in effect (meaning this test can be used) for the duration of the  COVID-19 declaration under Section 564(b)(1) of the Act, 21 U.S.C. section 360bbb-3(b)(1), unless the authorization is terminated or revoked sooner. Performed at [facility], [address] [place], [place] [address]   Blood Culture (routine x 2)     Status: None (Preliminary result)   Collection Time: 10/15/18  5:22 AM   Specimen: BLOOD  Result Value Ref Range Status   Specimen Description BLOOD RIGHT HAND  Final   Special Requests   Final    BOTTLES DRAWN AEROBIC AND ANAEROBIC Blood Culture adequate volume   Culture   Final    NO GROWTH 2 DAYS Performed at [facility], [address] [place], [place] [address]    Report Status PENDING  Incomplete      Radiology Studies: No results found.  Scheduled Meds: . enoxaparin (LOVENOX) injection  0.5 mg/kg Subcutaneous Q24H  . feeding supplement (PRO-STAT SUGAR FREE 64)  30 mL Oral TID  .  insulin aspart  0-20 Units Subcutaneous TID WC  . insulin aspart  0-5 Units Subcutaneous QHS  . insulin aspart  3 Units Subcutaneous TID WC  . insulin detemir  15 Units Subcutaneous BID  . methylPREDNISolone (SOLU-MEDROL) injection  60 mg Intravenous Q8H  . sodium chloride flush  3 mL Intravenous Q12H  . vitamin C  500 mg Oral Daily  . zinc sulfate  220 mg Oral Daily   Continuous Infusions: . remdesivir 100 mg in NS 250 mL 100 mg (10/16/18 1836)     LOS: 2 days   Time spent: 35 minutes.  Patrecia Pour, MD [facility] [email or web address] Password [email or web address] 10/17/2018, 9:19 AM

## 2018-10-18 LAB — CBC WITH DIFFERENTIAL/PLATELET
Abs Immature Granulocytes: 0.12 10*3/uL — ABNORMAL HIGH (ref 0.00–0.07)
Basophils Absolute: 0 10*3/uL (ref 0.0–0.1)
Basophils Relative: 0 %
Eosinophils Absolute: 0 10*3/uL (ref 0.0–0.5)
Eosinophils Relative: 0 %
HCT: 37.8 % (ref 36.0–46.0)
Hemoglobin: 12 g/dL (ref 12.0–15.0)
Immature Granulocytes: 1 %
Lymphocytes Relative: 11 %
Lymphs Abs: 1 10*3/uL (ref 0.7–4.0)
MCH: 26.4 pg (ref 26.0–34.0)
MCHC: 31.7 g/dL (ref 30.0–36.0)
MCV: 83.3 fL (ref 80.0–100.0)
Monocytes Absolute: 0.3 10*3/uL (ref 0.1–1.0)
Monocytes Relative: 3 %
Neutro Abs: 7.9 10*3/uL — ABNORMAL HIGH (ref 1.7–7.7)
Neutrophils Relative %: 85 %
Platelets: 461 10*3/uL — ABNORMAL HIGH (ref 150–400)
RBC: 4.54 MIL/uL (ref 3.87–5.11)
RDW: 13.1 % (ref 11.5–15.5)
WBC: 9.4 10*3/uL (ref 4.0–10.5)
nRBC: 0 % (ref 0.0–0.2)

## 2018-10-18 LAB — GLUCOSE, CAPILLARY
Glucose-Capillary: 230 mg/dL — ABNORMAL HIGH (ref 70–99)
Glucose-Capillary: 322 mg/dL — ABNORMAL HIGH (ref 70–99)
Glucose-Capillary: 321 mg/dL — ABNORMAL HIGH (ref 70–99)
Glucose-Capillary: 237 mg/dL — ABNORMAL HIGH (ref 70–99)

## 2018-10-18 LAB — COMPREHENSIVE METABOLIC PANEL
ALT: 47 U/L — ABNORMAL HIGH (ref 0–44)
AST: 20 U/L (ref 15–41)
Albumin: 3 g/dL — ABNORMAL LOW (ref 3.5–5.0)
Alkaline Phosphatase: 63 U/L (ref 38–126)
Anion gap: 19 — ABNORMAL HIGH (ref 5–15)
BUN: 27 mg/dL — ABNORMAL HIGH (ref 6–20)
CO2: 26 mmol/L (ref 22–32)
Calcium: 9.5 mg/dL (ref 8.9–10.3)
Chloride: 100 mmol/L (ref 98–111)
Creatinine, Ser: 0.65 mg/dL (ref 0.44–1.00)
GFR calc Af Amer: >60 mL/min (ref >60)
GFR calc non Af Amer: >60 mL/min (ref >60)
Glucose, Bld: 266 mg/dL — ABNORMAL HIGH (ref 70–99)
Potassium: 4.4 mmol/L (ref 3.5–5.1)
Sodium: 145 mmol/L (ref 135–145)
Total Bilirubin: 0.2 mg/dL — ABNORMAL LOW (ref 0.3–1.2)
Total Protein: 6.9 g/dL (ref 6.5–8.1)

## 2018-10-18 LAB — C-REACTIVE PROTEIN: CRP: 6 mg/dL — ABNORMAL HIGH (ref <1.0)

## 2018-10-18 LAB — D-DIMER, QUANTITATIVE: D-Dimer, Quant: 0.56 ug/mL-FEU — ABNORMAL HIGH (ref 0.00–0.50)

## 2018-10-18 LAB — FERRITIN: Ferritin: 429 ng/mL — ABNORMAL HIGH (ref 11–307)

## 2018-10-18 MED ORDER — METHYLPREDNISOLONE SODIUM SUCC 125 MG IJ SOLR: 60.0000 mg | Freq: Every day | INTRAMUSCULAR | Status: DC

## 2018-10-18 NOTE — Progress Notes (Signed)
PROGRESS NOTE  Paula Flores  BJS:[id number] DOB: 29-Nov-1965 DOA: 10/15/2018 PCP: Casilda Carls, MD   Brief Narrative: Paula Flores is a 53 y.o. female with a history of HTN, T2DM, and obesity who presented 6/24 with cough, weakness, fever and worsening shortness of breath having tested positive for coronavirus on 6/13. She presented to ED via EMS early 6/24 due to worsening shortness of breath found to be hypoxic with bilateral CXR infiltrates and confirmed positive coronavirus testing. Admission to [facility] requested. Steroids given, abx given, cultures taken. Hemodynamically stable and satting in mid-low 90%'s on 2.5L O2. On arrival at [facility] she states her shortness of breath is getting worse and is worse with exertion or with prolonged speaking. Remdesivir was started and a dose of tocilizumab administered. Hypoxia and work of breathing have improved. Final dose of remdesivir planned 6/28.  Assessment & Plan: Principal Problem:   Acute respiratory disease due to COVID-19 virus Active Problems:   HTN (hypertension)   Acute respiratory failure with hypoxia (HCC)   T2DM (type 2 diabetes mellitus) (HCC)   Obesity, Class III, BMI 40-49.9 (morbid obesity) ([place])  Acute hypoxic respiratory failure due to covid-19 pneumonia:  - Continue remdesivir x5 days (6/24 - 6/28) - Continue solumedrol 60mg  taper q8h > qd. - Gave tocilizumab 6/24. Monitor LFTs (ALT mildly elevated and decreased 6/26) and for secondary infections.  - Continue airborne, contact precautions. PPE including surgical gown, gloves, face shield, cap, shoe covers, and N-95 used during this encounter in a negative pressure room.  - Check daily inflammation markers, blood counts, CMP. CRP declining nicely 24 >> 11.  - Maintain euvolemia/net negative.  - Avoid NSAIDs - Vitamin C, zinc - Recommend proning and aggressive use of incentive spirometry.  Sepsis due to covid-19: Lactic acid wnl. PCT undetectable, stopped azithromycin,  ceftriaxone - Follow cultures  Hypokalemia: K 2.7.  - Replaced, resolved.   Elevated LFTs: Mild, bili wnl. Due to viral infection most likely.  - Monitor. Still candidate for remdesivir and actemra.   HTN:  - Pt had stopped taking norvasc, thiazide and beta blocker due to improvement in BP. Currently not on these medications and normotensive, will monitor off medications for now.   NIDT2DM: Has only been on medications for a few months. - Changes made to insulin regimen. Will need to monitor with deescalation of steroids.  Morbid obesity: BMI 48. - Weight loss recommended long term.  Thrombocytosis: Reactive.  - Monitor intermittently.  Constipation:  - Miralax, senna.   DVT prophylaxis: Lovenox 70mg  q24h Code Status: Full Family Communication: Daughter by phone Disposition Plan: Home once clinically improved. Possibly 6/28 depending on hypoxia/dyspnea as remdesivir course is completed.   Consultants:   None  Procedures:   None  Antimicrobials:  Remdesivir 6/24 - 6/28  Azithromycin, ceftriaxone 6/24   Subjective: Spanish video interpretor used throughout encounter. On room air and comfortable at rest. Still dyspnea on exertion worse than baseline, but feels generally better. Using IS regularly, wants to go home tomorrow.  Objective: Vitals:   10/17/18 1700 10/17/18 1800 10/17/18 2032 10/18/18 0600  BP:   (!) 153/101 (!) 154/85  Pulse: (!) 50 (!) 58 (!) 58 (!) 44  Resp:      Temp:   98.4 F (36.9 C) 97.8 F (36.6 C)  TempSrc:   Oral Oral  SpO2: 97% 95% 95% 94%  Weight:      Height:        Intake/Output Summary (Last 24 hours) at 10/18/2018 1106  Last data filed at 10/17/2018 1800 Gross per 24 hour  Intake 490 ml  Output -  Net 490 ml   Filed Weights   10/15/18 1352 10/15/18 1500  Weight: 135.4 kg 135.4 kg   Gen: Obese, pleasant female in no distress Pulm: Nonlabored breathing room air. Clear. CV: Regular rate and rhythm. No murmur, rub, or  gallop. No JVD, no dependent edema. GI: Abdomen soft, non-tender, non-distended, with normoactive bowel sounds.  Ext: Warm, no deformities Skin: No rashes, lesions or ulcers on visualized skin. Neuro: Alert and oriented. No focal neurological deficits. Psych: Judgement and insight appear fair. Mood euthymic & affect congruent. Behavior is appropriate.    Data Reviewed: I have personally reviewed following labs and imaging studies  CBC: Recent Labs  Lab 10/15/18 0521 10/16/18 0353 10/17/18 0300 10/18/18 0310  WBC 6.3 3.2* 8.5 9.4  NEUTROABS 4.8 2.2 7.2 7.9*  HGB 12.6 12.1 11.9* 12.0  HCT 38.7 39.2 37.8 37.8  MCV 80.6 83.2 83.6 83.3  PLT 379 419* 459* 188*   Basic Metabolic Panel: Recent Labs  Lab 10/15/18 0521 10/16/18 0353 10/17/18 0300 10/18/18 0310  NA 141 138 140 145  K 2.7* 4.3 4.5 4.4  CL 99 101 105 100  CO2 30 26 25 26   GLUCOSE 168* 276* 283* 266*  BUN 8 14 25* 27*  CREATININE 0.75 0.68 0.68 0.65  CALCIUM 8.6* 8.7* 8.8* 9.5  MG  --  2.4  --   --    GFR: Estimated Creatinine Clearance: 117.8 mL/min (by C-G formula based on SCr of 0.65 mg/dL). Liver Function Tests: Recent Labs  Lab 10/15/18 0521 10/16/18 0353 10/17/18 0300 10/18/18 0310  AST 64* 41 25 20  ALT 65* 66* 56* 47*  ALKPHOS 67 70 59 63  BILITOT 0.7 0.3 0.2* 0.2*  PROT 7.6 7.6 7.0 6.9  ALBUMIN 3.3* 3.2* 3.0* 3.0*   No results for input(s): LIPASE, AMYLASE in the last 168 hours. No results for input(s): AMMONIA in the last 168 hours. Coagulation Profile: No results for input(s): INR, PROTIME in the last 168 hours. Cardiac Enzymes: No results for input(s): CKTOTAL, CKMB, CKMBINDEX, TROPONINI in the last 168 hours. BNP (last 3 results) No results for input(s): PROBNP in the last 8760 hours. HbA1C: No results for input(s): HGBA1C in the last 72 hours. CBG: Recent Labs  Lab 10/17/18 0850 10/17/18 1145 10/17/18 1622 10/17/18 2047 10/18/18 0722  GLUCAP 307* 328* 348* 272* 230*   Lipid  Profile: No results for input(s): CHOL, HDL, LDLCALC, TRIG, CHOLHDL, LDLDIRECT in the last 72 hours. Thyroid Function Tests: No results for input(s): TSH, T4TOTAL, FREET4, T3FREE, THYROIDAB in the last 72 hours. Anemia Panel: Recent Labs    10/17/18 0300 10/18/18 0310  FERRITIN 574* 429*   Urine analysis: No results found for: COLORURINE, APPEARANCEUR, LABSPEC, PHURINE, GLUCOSEU, HGBUR, BILIRUBINUR, KETONESUR, PROTEINUR, UROBILINOGEN, NITRITE, LEUKOCYTESUR Recent Results (from the past 240 hour(s))  Blood Culture (routine x 2)     Status: None (Preliminary result)   Collection Time: 10/15/18  5:21 AM   Specimen: BLOOD  Result Value Ref Range Status   Specimen Description BLOOD LEFT ASSIST CONTROL  Final   Special Requests   Final    BOTTLES DRAWN AEROBIC AND ANAEROBIC Blood Culture results may not be optimal due to an excessive volume of blood received in culture bottles   Culture   Final    NO GROWTH 3 DAYS Performed at [facility], [address], [place], [place] [address]  Report Status PENDING  Incomplete  SARS Coronavirus 2 (CEPHEID- Performed in [place] hospital lab), H[facility]     Status: Abnormal   Collection Time: 10/15/18  5:21 AM   Specimen: Nasopharyngeal Swab  Result Value Ref Range Status   SARS Coronavirus 2 POSITIVE (A) NEGATIVE Final    Comment: RESULT CALLED TO, READ BACK BY AND VERIFIED WITH: JEN DALEY ON 10/15/2018 AT 0634 QSD (NOTE) If result is NEGATIVE SARS-CoV-2 target nucleic acids are NOT DETECTED. The SARS-CoV-2 RNA is generally detectable in upper and lower  respiratory specimens during the acute phase of infection. The lowest  concentration of SARS-CoV-2 viral copies this assay can detect is 250  copies / mL. A negative result does not preclude SARS-CoV-2 infection  and should not be used as the sole basis for treatment or other  patient management decisions.  A negative result may occur with  improper specimen collection /  handling, submission of specimen other  than nasopharyngeal swab, presence of viral mutation(s) within the  areas targeted by this assay, and inadequate number of viral copies  (<250 copies / mL). A negative result must be combined with clinical  observations, patient history, and epidemiological information. If result is POSITIVE SARS-CoV-2 target nucleic acids are DETECTED. Th e SARS-CoV-2 RNA is generally detectable in upper and lower  respiratory specimens during the acute phase of infection.  Positive  results are indicative of active infection with SARS-CoV-2.  Clinical  correlation with patient history and other diagnostic information is  necessary to determine patient infection status.  Positive results do  not rule out bacterial infection or co-infection with other viruses. If result is PRESUMPTIVE POSTIVE SARS-CoV-2 nucleic acids MAY BE PRESENT.   A presumptive positive result was obtained on the submitted specimen  and confirmed on repeat testing.  While 2019 novel coronavirus  (SARS-CoV-2) nucleic acids may be present in the submitted sample  additional confirmatory testing may be necessary for epidemiological  and / or clinical management purposes  to differentiate between  SARS-CoV-2 and other Sarbecovirus currently known to infect humans.  If clinically indicated additional testing with an alternate test  methodology [phone]) is  advised. The SARS-CoV-2 RNA is generally  detectable in upper and lower respiratory specimens during the acute  phase of infection. The expected result is Negative. Fact Sheet for Patients:  StrictlyIdeas.no Fact Sheet for Healthcare Providers: BankingDealers.co.za This test is not yet approved or cleared by the [place] FDA and has been authorized for detection and/or diagnosis of SARS-CoV-2 by FDA under an Emergency Use Authorization (EUA).  This EUA will remain in effect (meaning this  test can be used) for the duration of the COVID-19 declaration under Section 564(b)(1) of the Act, 21 U.S.C. section 360bbb-3(b)(1), unless the authorization is terminated or revoked sooner. Performed at [facility], [address] [place], [place] [address]   Blood Culture (routine x 2)     Status: None (Preliminary result)   Collection Time: 10/15/18  5:22 AM   Specimen: BLOOD  Result Value Ref Range Status   Specimen Description BLOOD RIGHT HAND  Final   Special Requests   Final    BOTTLES DRAWN AEROBIC AND ANAEROBIC Blood Culture adequate volume   Culture   Final    NO GROWTH 3 DAYS Performed at [facility], [address] [place], [place] [address]    Report Status PENDING  Incomplete      Radiology Studies: No results found.  Scheduled Meds: .  enoxaparin (LOVENOX) injection  0.5 mg/kg Subcutaneous Q24H  . feeding supplement (PRO-STAT SUGAR FREE 64)  30 mL Oral TID  . insulin aspart  0-20 Units Subcutaneous TID WC  . insulin aspart  0-5 Units Subcutaneous QHS  . insulin aspart  6 Units Subcutaneous TID WC  . insulin detemir  20 Units Subcutaneous BID  . methylPREDNISolone (SOLU-MEDROL) injection  60 mg Intravenous Q8H  . polyethylene glycol  17 g Oral Daily  . senna  1 tablet Oral Daily  . sodium chloride flush  3 mL Intravenous Q12H  . vitamin C  500 mg Oral Daily  . zinc sulfate  220 mg Oral Daily   Continuous Infusions: . remdesivir 100 mg in NS 250 mL Stopped (10/17/18 1714)     LOS: 3 days   Time spent: 35 minutes.  Patrecia Pour, MD [facility] [email or web address] Password [email or web address] 10/18/2018, 11:06 AM

## 2018-10-19 LAB — CBC WITH DIFFERENTIAL/PLATELET
Abs Immature Granulocytes: 0.26 10*3/uL — ABNORMAL HIGH (ref 0.00–0.07)
Basophils Absolute: 0 10*3/uL (ref 0.0–0.1)
Basophils Relative: 0 %
Eosinophils Absolute: 0 10*3/uL (ref 0.0–0.5)
Eosinophils Relative: 0 %
HCT: 40.2 % (ref 36.0–46.0)
Hemoglobin: 12.7 g/dL (ref 12.0–15.0)
Immature Granulocytes: 3 %
Lymphocytes Relative: 17 %
Lymphs Abs: 1.5 10*3/uL (ref 0.7–4.0)
MCH: 26.2 pg (ref 26.0–34.0)
MCHC: 31.6 g/dL (ref 30.0–36.0)
MCV: 82.9 fL (ref 80.0–100.0)
Monocytes Absolute: 0.7 10*3/uL (ref 0.1–1.0)
Monocytes Relative: 8 %
Neutro Abs: 6.4 10*3/uL (ref 1.7–7.7)
Neutrophils Relative %: 72 %
Platelets: 474 10*3/uL — ABNORMAL HIGH (ref 150–400)
RBC: 4.85 MIL/uL (ref 3.87–5.11)
RDW: 12.8 % (ref 11.5–15.5)
WBC: 8.9 10*3/uL (ref 4.0–10.5)
nRBC: 0 % (ref 0.0–0.2)

## 2018-10-19 LAB — COMPREHENSIVE METABOLIC PANEL
ALT: 44 U/L (ref 0–44)
AST: 22 U/L (ref 15–41)
Albumin: 3 g/dL — ABNORMAL LOW (ref 3.5–5.0)
Alkaline Phosphatase: 56 U/L (ref 38–126)
Anion gap: 8 (ref 5–15)
BUN: 28 mg/dL — ABNORMAL HIGH (ref 6–20)
CO2: 30 mmol/L (ref 22–32)
Calcium: 8.7 mg/dL — ABNORMAL LOW (ref 8.9–10.3)
Chloride: 105 mmol/L (ref 98–111)
Creatinine, Ser: 0.63 mg/dL (ref 0.44–1.00)
GFR calc Af Amer: >60 mL/min (ref >60)
GFR calc non Af Amer: >60 mL/min (ref >60)
Glucose, Bld: 173 mg/dL — ABNORMAL HIGH (ref 70–99)
Potassium: 4.2 mmol/L (ref 3.5–5.1)
Sodium: 143 mmol/L (ref 135–145)
Total Bilirubin: 0.3 mg/dL (ref 0.3–1.2)
Total Protein: 6.6 g/dL (ref 6.5–8.1)

## 2018-10-19 LAB — GLUCOSE, CAPILLARY
Glucose-Capillary: 128 mg/dL — ABNORMAL HIGH (ref 70–99)
Glucose-Capillary: 134 mg/dL — ABNORMAL HIGH (ref 70–99)
Glucose-Capillary: 101 mg/dL — ABNORMAL HIGH (ref 70–99)

## 2018-10-19 LAB — HEMOGLOBIN A1C
Hgb A1c MFr Bld: 8.5 % — ABNORMAL HIGH (ref 4.8–5.6)
Mean Plasma Glucose: 197.25 mg/dL

## 2018-10-19 LAB — C-REACTIVE PROTEIN: CRP: 3.4 mg/dL — ABNORMAL HIGH (ref <1.0)

## 2018-10-19 MED ORDER — METFORMIN HCL 500 MG PO TABS: 500.0000 mg | ORAL_TABLET | Freq: Two times a day (BID) | ORAL | 0 refills | Status: DC

## 2018-10-19 MED ORDER — GLIPIZIDE ER 10 MG PO TB24
10.0000 mg | ORAL_TABLET | Freq: Every day | ORAL | Status: DC
  Administered 2018-10-19: 10 mg via ORAL
  Filled 2018-10-19 (×3): qty 1

## 2018-10-19 MED ORDER — GUAIFENESIN-DM 100-10 MG/5ML PO SYRP: 10.0000 mL | ORAL_SOLUTION | ORAL | 0 refills | Status: DC | PRN

## 2018-10-19 MED ORDER — GLIPIZIDE ER 5 MG PO TB24: 10.0000 mg | ORAL_TABLET | Freq: Every day | ORAL | Status: AC

## 2018-10-19 NOTE — Discharge Planning (Signed)
IV and tele to be removed - after final remdesivir dose to to be concluded about 1700.  RN assessment and VS revealed stability for DC to home with husband.  [place] ripts sent to [facility] ([address] [facility] [place]  Informed of suggested FU appt with PCP.  Discussed importance of continued self monitoring ans s/sx of others in the house (also covid positive).  Discussed when to contact doctor or return go to the hospital.  Informed need to quarantine and remain inside the house - including work, for additional 2 weeks. Work note given.  Patient verbally agreed and also signed health services contract (placed on chart).  Once ready, will be wheeled to front and family will transport home via car.

## 2018-10-19 NOTE — Progress Notes (Addendum)
SATURATION QUALIFICATIONS: (This note is used to comply with regulatory documentation for home oxygen)  Patient Saturations on Room Air at Rest = 98%  Patient Saturations on Room Air while Ambulating = 97-94%   Please briefly explain why patient needs home oxygen: Pt able to maintain SpO2 >90% during ADLs and functional mobility. Not requiring O2 for activity at this time.   [place], OTR/L Acute Rehab Pager: [phone] Office: [phone]

## 2018-10-19 NOTE — Evaluation (Signed)
Occupational Therapy Evaluation Patient Details Name: Paula Flores MRN: [id number] DOB: 05-27-1965 Today's Date: 10/19/2018    History of Present Illness Pt is a 53 y.o. female admitted 10/15/18 with cough, weakness, fever and SOB; pt initially tested positive for COVID-19 on 10/04/18. PMH includes HTN, DM2, obesity.   Clinical Impression   PTA, pt was living with her husband and was independent and working full time. Pt currently performing ADLs and functional mobility at supervision level. Pt very motivated to participate in therapy and presenting with decreased activity tolerance and fatigues with increased standing and mobility. Provided pt with education and handout on [facility] for carry over to home. Spo2 maintaining >94% on RA throughout. Pt planning for dc later today. All acute OT needs met and education provided in preparation for home. Discussed difference DME options for EC and pt declining. Recommend dc to home once medically stable per physician.      Follow Up Recommendations  No OT follow up;Supervision - Intermittent    Equipment Recommendations  None recommended by OT    Recommendations for Other Services       Precautions / Restrictions Precautions Precautions: Fall Restrictions Weight Bearing Restrictions: No      Mobility Bed Mobility Overal bed mobility: Modified Independent             General bed mobility comments: Increased time and use of rails  Transfers Overall transfer level: Independent Equipment used: None                  Balance Overall balance assessment: Needs assistance   Sitting balance-Leahy Scale: Good       Standing balance-Leahy Scale: Fair                             ADL either performed or assessed with clinical judgement   ADL Overall ADL's : Needs assistance/impaired Eating/Feeding: Independent;Sitting   Grooming: Supervision/safety;Standing   Upper Body Bathing: Supervision/ safety;Sitting    Lower Body Bathing: Supervison/ safety;Sit to/from stand   Upper Body Dressing : Supervision/safety;Sitting   Lower Body Dressing: Supervision/safety;Sit to/from stand               Functional mobility during ADLs: Supervision/safety General ADL Comments: Pt performing ADLs and functional mobility at supervision level. Pt presenting with fatigue with increased activity requiring seat rest break. Pt SpO2 between 100-93% on RA. Provided education and handout on EC (in spanish) for carry over to home.     Vision Baseline Vision/History: Wears glasses Patient Visual Report: No change from baseline       Perception     Praxis      Pertinent Vitals/Pain Pain Assessment: No/denies pain     Hand Dominance     Extremity/Trunk Assessment Upper Extremity Assessment Upper Extremity Assessment: Overall WFL for tasks assessed   Lower Extremity Assessment Lower Extremity Assessment: Defer to PT evaluation   Cervical / Trunk Assessment Cervical / Trunk Assessment: Other exceptions Cervical / Trunk Exceptions: Increased body habitus   Communication Communication Communication: No difficulties   Cognition Arousal/Alertness: Awake/alert Behavior During Therapy: WFL for tasks assessed/performed Overall Cognitive Status: Within Functional Limits for tasks assessed                                     General Comments  SpO2 dropping to 96% during initial mobility. WIth increased mobility,  pt fatigues and SpO2 dropping to 93% on RA. able to recover with seat rest break    Exercises     Shoulder Instructions      Home Living Family/patient expects to be discharged to:: Private residence Living Arrangements: Spouse/significant other;Children Available Help at Discharge: Family;Available 24 hours/day Type of Home: House Home Access: Stairs to enter     Home Layout: One level     Bathroom Shower/Tub: Occupational psychologist: Standard     Home  Equipment: None          Prior Functioning/Environment Level of Independence: Independent        Comments: Works for [employer] Problem List: Decreased activity tolerance;Impaired balance (sitting and/or standing);Decreased knowledge of use of DME or AE;Decreased knowledge of precautions      OT Treatment/Interventions:      OT Goals(Current goals can be found in the care plan section) Acute Rehab OT Goals Patient Stated Goal: Return home to family OT Goal Formulation: All assessment and education complete, DC therapy  OT Frequency:     Barriers to D/C:            Co-evaluation              AM-PAC OT "6 Clicks" Daily Activity     Outcome Measure Help from another person eating meals?: None Help from another person taking care of personal grooming?: None Help from another person toileting, which includes using toliet, bedpan, or urinal?: None Help from another person bathing (including washing, rinsing, drying)?: None Help from another person to put on and taking off regular upper body clothing?: None Help from another person to put on and taking off regular lower body clothing?: None 6 Click Score: 24   End of Session Nurse Communication: Mobility status;Other (comment)(SpO2)  Activity Tolerance: Patient tolerated treatment well Patient left: in chair;with call bell/phone within reach  OT Visit Diagnosis: Other abnormalities of gait and mobility (R26.89);Muscle weakness (generalized) (M62.81)                Time: 0810-0829 OT Time Calculation (min): 19 min Charges:  OT General Charges $OT Visit: 1 Visit OT Evaluation $OT Eval Low Complexity: [place], OTR/L Acute Rehab Pager: [phone] Office: [place] 10/19/2018, 8:36 AM

## 2018-10-19 NOTE — Discharge Instructions (Signed)
COVID-19 COVID-19 La COVID-19 es una infeccin respiratoria causada por un virus llamado coronavirus tipo 2 causante del sndrome respiratorio agudo grave (SARS-CoV-2). La enfermedad tambin se conoce como enfermedad por coronavirus o nuevo coronavirus. En algunas personas, el virus puede no ocasionar sntomas. En otras, puede producir una infeccin grave. La infeccin puede empeorar rpidamente y causar complicaciones, como:  Neumona o infeccin en los pulmones.  Sndrome de dificultad respiratoria aguda o SDRA. Se trata de la acumulacin de lquido en los pulmones.  Insuficiencia respiratoria aguda. Se trata de una afeccin en la que no pasa suficiente oxgeno de los pulmones al cuerpo.  Sepsis o choque sptico. Se trata de una reaccin grave del cuerpo ante una infeccin.  Problemas de coagulacin.  Infecciones secundarias debido a bacterias u hongos. El virus que causa la COVID-19 es contagioso. Esto significa que puede transmitirse de una persona a otra a travs de las gotitas de saliva de la tos y de los estornudos (secreciones respiratorias). Cules son las causas? Esta enfermedad es causada por un virus. Usted puede contagiarse con este virus:  Al aspirar las gotitas que una persona infectada elimina al toser o estornudar.  Al tocar algo, como una mesa o el picaportes de una puerta, que estuvo expuesto al virus (contaminado) y luego tocarse la boca, nariz o los ojos. Qu incrementa el riesgo? Riesgo de infeccin Es ms probable que se infecte con este virus si:  Vive o viaja a una zona donde hay un brote de COVID-19.  Entra en contacto con una persona enferma que recientemente viaj a una zona con un brote de COVID-19.  Cuida o vive con una persona infectada con COVID-19. Riesgo de enfermedad grave Es ms probable que se enferme gravemente por el virus si:  Tiene 65aos o ms.  Tiene una enfermedad crnica que disminuye la capacidad del cuerpo para combatir las  infecciones (immunocomprometido).  Vive en un hogar de ancianos o centro de atencin a largo plazo.  Tiene una enfermedad prolongada (crnica), como las siguientes: ? Enfermedad pulmonar crnica, que incluye la enfermedad pulmonar obstructiva crnica o asma. ? Enfermedad cardaca. ? Diabetes. ? Enfermedad renal crnica. ? Enfermedad heptica.  Es obeso. Cules son los signos o sntomas? Los sntomas de esta afeccin pueden ser de leves a graves. Los sntomas pueden aparecer en el trmino de 2 a 14 das despus de haber estado expuesto al virus. Incluyen los siguientes:  Fiebre.  Tos.  Dificultad para respirar.  Escalofros.  Dolores musculares.  Dolor de garganta.  Prdida del gusto o el olfato. Algunas personas tambin pueden tener problemas estomacales, como nuseas, vmitos o diarrea. Es posible que otras personas no tengan sntomas de COVID-19. Cmo se diagnostica? Esta afeccin se puede diagnosticar en funcin de lo siguiente:  Sus signos y sntomas, especialmente si: ? Vive en una zona donde hay un brote de COVID-19. ? Viaj recientemente a una zona donde el virus es frecuente. ? Cuida o vive con una persona a quien se le diagnostic COVID-19.  Un examen fsico.  Anlisis de laboratorio que pueden incluir: ? Un hisopado nasal para tomar una muestra de lquido de la nariz. ? Un hisopado de garganta para tomar una muestra de lquido de la garganta. ? Una muestra de mucosidad de los pulmones (esputo). ? Anlisis de sangre.  Los estudios de diagnstico por imgenes pueden incluir radiografas, exploracin por tomografa computarizada (TC) o ecografa. Cmo se trata? En este momento, no hay ningn medicamento para tratar la COVID-19. Los medicamentos para tratar   otras enfermedades se usan a modo de ensayo para comprobar si son eficaces contra la COVID-19. El mdico le informar sobre las maneras de tratar los sntomas. En la mayora de las personas, la infeccin  es leve y puede controlarse en el hogar con reposo, lquidos y medicamentos de venta libre. El tratamiento para una infeccin grave suele realizarse en la unidad de cuidados intensivos (UCI) de un hospital. Puede incluir uno o ms de los siguientes. Estos tratamientos se administran hasta que los sntomas mejoran.  Recibir lquidos y medicamentos a travs de una va intravenosa.  Oxgeno complementario. Para administrar oxgeno extra, se utiliza un tubo en la nariz, una mascarilla o una campana de oxgeno.  Colocarlo para que se recueste boca abajo (decbito prono). Esto facilita el ingreso de oxgeno a los pulmones.  Uso continuo de una mquina de presin positiva de las vas areas (CPAP) o de presin positiva de las vas areas de dos niveles (BPAP). Este tratamiento utiliza una presin de aire leve para mantener las vas respiratorias abiertas. Un tubo conectado a un motor administra oxgeno al cuerpo.  Respirador. Este tratamiento mueve el aire dentro y fuera de los pulmones mediante el uso de un tubo que se coloca en la trquea.  Traqueostoma. En este procedimiento se hace un orificio en el cuello para insertar un tubo de respiracin.  Oxigenacin por membrana extracorprea (OMEC). En este procedimiento, los pulmones tienen la posibilidad de recuperarse al asumir las funciones del corazn y los pulmones. Suministra oxgeno al cuerpo y elimina el dixido de carbono. Siga estas instrucciones en su casa: Estilo de vida  Si est enfermo, qudese en su casa, excepto para obtener atencin mdica. El mdico le indicar cunto tiempo debe quedarse en casa. Llame al mdico antes de buscar atencin mdica.  Haga reposo en su casa como se lo haya indicado el mdico.  No consuma ningn producto que contenga nicotina o tabaco, como cigarrillos, cigarrillos electrnicos y tabaco de mascar. Si necesita ayuda para dejar de fumar, consulte al mdico.  Retome sus actividades normales como se lo haya  indicado el mdico. Pregntele al mdico qu actividades son seguras para usted. Instrucciones generales  Use los medicamentos de venta libre y los recetados solamente como se lo haya indicado el mdico.  Beba suficiente lquido como para mantener la orina de color amarillo plido.  Concurra a todas las visitas de seguimiento como se lo haya indicado el mdico. Esto es importante. Cmo se evita?  No hay ninguna vacuna que ayude a prevenir la infeccin por la COVID-19. Sin embargo, hay medidas que puede tomar para protegerse y proteger a otras personas de este virus. Para protegerse:   No viaje a zonas donde la COVID-19 sea un riesgo. Las zonas donde se informa la presencia de la COVID-19 cambian con frecuencia. Para identificar las zonas de alto riesgo y las restricciones de viaje, consulte el sitio web de viajes de los Centers for Disease Control and Prevention (CDC) (Centros para el Control y la Prevencin de Enfermedades): wwwnc.cdc.gov/travel/notices  Si vive o debe viajar a una zona donde COVID-19 es un riesgo, tome precauciones para evitar infecciones. ? Aljese de las personas enfermas. ? Lvese las manos frecuentemente con agua y jabn durante 20segundos. Use desinfectante para manos con alcohol si no dispone de agua y jabn. ? Evite tocarse la boca, la cara, los ojos o la nariz. ? Evite salir de su casa, siga las indicaciones de su estado y de las autoridades sanitarias locales. ? Si debe   salir de su casa, use un barbijo de tela o una mascarilla facial. ? Desinfecte los objetos y las superficies que se tocan con frecuencia todos los das. Pueden incluir:  Encimeras y mesas.  Picaportes e interruptores de luz.  Lavabos, fregaderos y grifos.  Aparatos electrnicos tales como telfonos, controles remotos, teclados, computadoras y tabletas. Cmo proteger a los dems: Si tiene sntomas de la COVID-19, tome medidas para evitar que el virus se propague a otras personas.  Si cree  que tiene una infeccin por la COVID-19, comunquese de inmediato con su mdico. Informe al equipo de atencin mdica que cree que puede tener una infeccin por la COVID-19.  Qudese en su casa. Salga de su casa solo para buscar atencin mdica. No utilice el transporte pblico.  No viaje mientras est enfermo.  Lvese las manos frecuentemente con agua y jabn durante 20segundos. Usar desinfectante para manos con alcohol si no dispone de agua y jabn.  Mantngase alejado de quienes vivan con usted. Permita que los miembros de la familia sanos cuiden a los nios y las mascotas, si es posible. Si tiene que cuidar a los nios o las mascotas, lvese las manos con frecuencia y use un barbijo. Si es posible, permanezca en su habitacin, separado de los dems. Utilice un bao diferente.  Asegrese de que todas las personas que viven en su casa se laven bien las manos y con frecuencia.  Tosa o estornude en un pauelo de papel o sobre su manga o codo. No tosa o estornude al aire ni se cubra la boca o la nariz con la mano.  Use un barbijo de tela o una mascarilla facial. Dnde buscar ms informacin  Centers for Disease Control and Prevention (Centros para el Control y la Prevencin de Enfermedades): www.cdc.gov/coronavirus/2019-ncov/index.html  World Health Organization (Organizacin Mundial de la Salud): www.who.int/health-topics/coronavirus Comunquese con un mdico si:  Vive o ha viajado a una zona donde la COVID-19 es un riesgo y tiene sntomas de infeccin.  Ha tenido contacto con alguien que tiene COVID-19 y usted tiene sntomas de infeccin. Solicite ayuda de inmediato si:  Tiene dificultad para respirar.  Siente dolor u opresin en el pecho.  Experimenta confusin.  Tiene las uas de los dedos y los labios de color azulado.  Tiene dificultad para despertarse.  Los sntomas empeoran. Estos sntomas pueden representar un problema grave que constituye una emergencia. No espere a  ver si los sntomas desaparecen. Solicite atencin mdica de inmediato. Comunquese con el servicio de emergencias de su localidad (911 en los [place]). No conduzca por sus propios medios hasta el hospital. Informe al personal mdico de emergencias si cree que tiene COVID-19. Resumen  La COVID-19 es una infeccin respiratoria causada por un virus. Tambin se conoce como enfermedad por coronavirus o nuevo coronavirus. Puede causar infecciones graves, como neumona, sndrome de dificultad respiratoria aguda, insuficiencia respiratoria aguda o sepsis.  El virus que causa la COVID-19 es contagioso. Esto significa que puede transmitirse de una persona a otra a travs de las gotitas de saliva de la tos y de los estornudos.  Es ms probable que desarrolle una enfermedad grave si tiene 65 aos o ms, tiene un sistema inmunitario dbil, vive en un hogar de ancianos o tiene enfermedad crnica.  No hay ningn medicamento para tratar la COVID-19. El mdico le informar sobre las maneras de tratar los sntomas.  Tome medidas para protegerse y proteger a los dems contra las infecciones. Lvese las manos con frecuencia y desinfecte los objetos y   las superficies que se tocan con frecuencia todos los das. Mantngase alejado de las personas que estn enfermas y use un barbijo si est enfermo. Esta informacin no tiene Marine scientist el consejo del mdico. Asegrese de hacerle al mdico cualquier pregunta que tenga. Document Released: 06/07/2018 Document Revised: 09/09/2018 Document Reviewed: 06/07/2018 Elsevier Patient Education  [place].  COVID-19: Cmo protegerse y proteger a los dems COVID-19: How to Protect Yourself and Others Sepa cmo se propaga  Actualmente, no existe ninguna vacuna para prevenir la enfermedad por coronavirus 2019 (COVID-19).  La mejor forma de prevenir la enfermedad es evitar exponerse a este virus.  Se cree que el virus se transmite principalmente de [place]  persona a [place]. ? [employer] que estn en contacto directo entre s (a una distancia inferior a 6 pies [1.32m]). ? A travs de las gotitas respiratorias producidas cuando una persona infectada tose, estornuda o habla. ? Estas gotitas pueden caer en la boca o en la nariz de las personas que estn cerca o pueden ser [employer] pulmones. ? Algunos estudios recientes sugieren que la COVID-19 puede ser transmitida por personas que no presentan sntomas. Lo que todos deben hacer [employer] las manos con frecuencia  Lvese las manos con frecuencia con agua y jabn durante al menos 20 segundos, especialmente despus de Investment banker, corporate en un lugar pblico o despus de sonarse la nariz, toser o estornudar.  Si no dispone de [place] y [place], use un desinfectante de manos que contenga al menos un 60% de alcohol. [place] y frtelas hasta que se sientan secas.  No se toque los ojos, la nariz y la boca sin antes lavarse las manos. Evite el contacto cercano  Qudese en casa si est enfermo.  Evite el contacto cercano con personas que estn enfermas.  Establezca distancia entre usted y [employer]. ? Recuerde que [employer] Products no tienen sntomas pueden transmitir el virus. ? Esto es Scientist, research (life sciences) para las personas que tienen ms riesgo de [email or web address] Cbrase la boca y la nariz con un barbijo de tela cuando est cerca de otras personas  Puede transmitir la COVID-19 a Producer, television/film/video aunque no se sienta enfermo.  Todas las personas deben usar un barbijo de tela cuando tengan que ir a un lugar pblico, por ejemplo, al supermercado o a buscar otros productos necesarios. ? Los barbijos de tela no deben colocarse a nios menores de 2 aos de [place], a las personas que tienen problemas respiratorios o que estn inconscientes, incapacitadas o que por algn motivo no  puedan quitarse la mascarilla sin [place].  El propsito del barbijo de tela es proteger a Producer, television/film/video en caso de que usted est infectado.  NO utilice las [place] [employer] a los trabajadores de [employer]	[place].  Contine manteniendo una distancia aproximada de 6 pies (1.74m) entre usted y [employer]. El barbijo de tela no reemplaza el distanciamiento social. Rachel Moulds al toser y estornudar  Si est en un ambiente privado y no tiene el barbijo de tela, recuerde siempre cubrirse la boca y la nariz con un pauelo descartable al toser o Brewing technologist, o usar el pliegue del codo.  Deseche los pauelos descartables usados en la basura.  Inmediatamente, lvese las manos con agua y [place] durante al menos 20segundos. Si no dispone de agua y [place], [employer] con un desinfectante de manos que contenga al menos un 60% de alcohol. [place] y desinfecte  [place]  que se tocan con frecuencia [employer]. Esto incluye mesas, picaportes, interruptores de luz, encimeras, mangos, escritorios, telfonos, teclados, inodoros, grifos y lavabos. [email or web address]  Si las superficies estn sucias, lmpielas: Use detergente o jabn y agua antes de la desinfeccin.  Luego, use un desinfectante domstico. Puede consultar una lista de los desinfectantes domsticos registrados en Conservation officer, historic buildings (EPA) (Agencia de Proteccin Ambiental) aqu. [email or web address] 08/26/2018 Esta informacin no tiene Marine scientist el consejo del mdico. Asegrese de hacerle al mdico cualquier pregunta que tenga. Document Released: 08/14/2018 Document Revised: 09/09/2018 Document Reviewed: 08/05/2018 Elsevier Patient Education  [place] frecuentes sobre el COVID-19 COVID-19 Frequently Asked Questions El COVID-19 (enfermedad por coronavirus) es una infeccin causada por una gran familia  de virus. Algunos virus causan [employer] y otros causan enfermedades en animales tales como los camellos, los gatos y los murcilagos. En algunos casos, los virus que causan [employer] Inc pueden transmitirse a los seres humanos. De dnde provino el coronavirus? En diciembre de 2019, [place] le inform a la Organizacin Mundial de la Salud (OMS) acerca de varios casos de enfermedad pulmonar (enfermedad respiratoria humana). Estos casos estaban vinculados con un mercado abierto de frutos de mar y [place] en [place]. El vnculo con el mercado de ganado y [employer] sugiere que el virus puede haberse propagado de los animales a los [place]. Sin embargo, desde Engineer, materials brote en diciembre, tambin se ha demostrado que el virus se contagia de [place] persona a Theatre manager. Cul es el nombre de la enfermedad y del virus? Nombre de la enfermedad Al principio, esta enfermedad se llam nuevo coronavirus. Esto se debe a que los cientficos determinaron que la enfermedad era causada por un nuevo virus respiratorio. La Organizacin Mundial de la Salud (OMS) ahora ha dado a la enfermedad el nombre de COVID-19, o enfermedad por coronavirus. Nombre del virus El virus causante de la enfermedad se conoce como coronavirus de tipo 2 causante del sndrome respiratorio agudo grave (SARS-CoV-2). Ms informacin sobre el nombre de la enfermedad y el virus Organizacin Mundial de la [place] (OMS): www.who.int/emergencies/diseases/novel-coronavirus-2019/technical-guidance/naming-the-coronavirus-disease-(covid-2019)-and-the-virus-that-causes-it Quines estn en riesgo de sufrir complicaciones debido a la enfermedad por coronavirus? Algunas personas pueden tener un riesgo ms alto de tener complicaciones debido a la enfermedad por coronavirus. Entre ellas se encuentran los [employer] y las personas que tienen enfermedades crnicas, como enfermedad cardaca, diabetes y enfermedad pulmonar. Si  tiene un riesgo ms alto de Advice worker, tome estas precauciones adicionales:  Counselling psychologist cercano con personas que estn enfermas o que tengan fiebre o tos. Permanecer al menos a una distancia de 3 a 6 pies (1-40m) de las [employer] Pacific, si es posible.  Lavarse las manos regularmente con agua y jabn durante al menos 20segundos.  Evitar tocarse la cara, la boca, la nariz y los ojos.  Tener a [employer] su casa, como alimentos, medicamentos y productos de limpieza.  Permanecer en su casa todo lo que sea posible.  Republic reuniones sociales y los viajes. Cmo se transmite la enfermedad causada por el coronavirus? El virus que causa la enfermedad por coronavirus se transmite fcilmente de Ardelia Mems persona a otra (es contagioso). Tambin hay casos de enfermedad de transmisin comunitaria. Esto significa que la enfermedad se ha propagado a:  Personas que no tienen contacto conocido con Doctor, hospital.  Personas que no han viajado a zonas donde hay casos conocidos. Aparentemente, se transmite de Ardelia Mems persona a  otra a travs de las [employer] se despiden al toser o al estornudar. Puedo contraer al virus al tocar superficies u objetos? Todava hay mucho que no se conoce acerca del virus que causa la enfermedad por coronavirus. Los cientficos basan gran parte de la informacin en lo que saben sobre virus similares, por ejemplo:  En general, los virus no sobreviven en superficies durante mucho tiempo. Necesitan un cuerpo humano (husped) para sobrevivir.  Es ms probable que el virus se contagie por contacto cercano con personas que estn enfermas (contacto directo), por ejemplo: ? Al estrechar las manos o abrazarse. ? Al inhalar las gotitas respiratorias que se desplazan por el aire. Esto puede ocurrir cuando una persona infectada tose o estornuda sobre o cerca de Producer, television/film/video.  Es menos probable que el virus se propague cuando una persona toca una  superficie o un objeto sobre el que est el virus (contacto indirecto). El virus puede ingresar al cuerpo si la persona toca una superficie o un objeto y [employer] se toca la cara, los ojos, la nariz o la boca. Una persona puede contagiar el virus sin tener sntomas de la enfermedad? Puede ser posible que el virus se contagie antes de que la persona tenga sntomas de la enfermedad, pero muy probablemente esta no sea la principal forma en que el virus se est propagando. Es ms probable que el virus se propague al estar en contacto directo con personas que estn enfermas e inhalar las gotas respiratorias que una persona enferma despide al toser o Brewing technologist. Cules son los sntomas de la enfermedad causada por el coronavirus? Los sntomas varan de Ardelia Mems persona a otra y pueden variar de leves a graves. [employer], se pueden incluir los siguientes:  [place].  Tos.  Cansancio, debilidad o fatiga.  Respiracin rpida o sensacin de falta el aire. Estos sntomas pueden aparecer en el trmino de 2 a [address] despus de haber estado expuesto al virus. Si presenta sntomas, llame al mdico. Las personas con sntomas graves pueden necesitar atencin hospitalaria. Si estoy expuesto al virus, cunto tiempo tardan en aparecer los sntomas? Los sntomas de la enfermedad por coronavirus Academic librarian en el trmino de 2 a 14 das despus de que una persona haya estado expuesta al virus. Si presenta sntomas, llame al mdico. Debo hacerme un anlisis de deteccin del virus? El mdico decidir si debe realizarse un anlisis en funcin de sus sntomas, antecedentes de exposicin y factores de [place]. Cmo realiza el mdico el anlisis para detectar este virus? Los mdicos obtienen muestras para enviar a Physiological scientist. Estas muestras pueden incluir lo siguiente:  Tomar con un hisopo lquido de Mudlogger.  Pedirle que tosa mucosidad (esputo) para extraer lquido de los pulmones en un  recipiente estril.  Tomar una muestra de [place].  Tomar una [place] de heces u [place]. Hay algn tratamiento o vacuna para este virus? Actualmente, no existe ninguna vacuna para prevenir la enfermedad por coronavirus. Adems, no existen [employer] antibiticos o los antivirales para tratar el virus. Una persona que se enferma recibe tratamiento de apoyo, lo que significa reposo y lquidos. Una persona tambin puede aliviar sus sntomas con medicamentos de venta libre para tratar los estornudos, la tos y el goteo nasal. Son los mismos medicamentos que se toman para el resfro comn. Si presenta sntomas, llame al mdico. Las personas con sntomas graves pueden necesitar atencin hospitalaria. Qu puedo hacer para protegerme y proteger a mi familia de este virus?  Puede protegerse y proteger a su familia tomando las mismas medidas que tomara para prevenir el contagio de otros virus. [employer] Petroleum las siguientes medidas:  Lavarse las manos regularmente con agua y [place] durante al menos 20segundos. Usar desinfectante para manos con alcohol si no dispone de [place] y [place].  Evitar tocarse la cara, la boca, la nariz y los ojos.  Toser o estornudar en un pauelo descartable, sobre su manga o codo. No toser o estornudar al aire ni cubrirse con la [place]. ? Si tose o estornuda en un pauelo de papel, deschelo inmediatamente y [employer].  Desinfectar los [employer]ie y las superficies que se tocan con frecuencia todos [place].  Evitar el contacto cercano con personas que estn enfermas o que tengan fiebre o tos. Permanecer al menos a una distancia de 3 a 6 pies (1-84m) de las [employer] Pacific, si es posible.  Jimmye Norman en su casa si est enfermo, excepto para obtener atencin mdica. Llame al mdico antes de buscar atencin mdica.  Asegrese de [place] vacunas al da. Pregntele al mdico qu vacunas necesita. Qu debo hacer si tengo que viajar? Siga las recomendaciones relacionadas con  los viajes de la autoridad de Arboriculturist, los CDC y Heritage manager. Informacin y consejos para Archivist for Disease Control and Prevention Librarian, academic) (Centros para el Control y la Prevencin de Arboriculturist): BodyEditor.hu  Organizacin [place] (OMS): ThirdIncome.ca [employer] riesgos y tome medidas para proteger su salud  El riesgo de Museum/gallery curator la enfermedad por coronavirus es ms alto si viaja a zonas con un brote o si est en contacto con viajeros que provienen de zonas donde hay un brote.  Lvese las manos con frecuencia y [place] higiene [place] para reducir el riesgo de contagiarse o transmitir el virus. Qu debo hacer si estoy enfermo? Instrucciones generales para detener la propagacin de la infeccin  Lavarse las manos regularmente con agua y jabn durante al menos 20segundos. Usar desinfectante para manos con alcohol si no dispone de [place] y [place].  Toser o estornudar en un pauelo descartable, sobre su manga o codo. No toser o estornudar al aire ni cubrirse con la [place].  Si tose o estornuda en un pauelo de papel, deschelo inmediatamente y [employer].  Foy [place] en su casa a menos que deba recibir [employer]. Llame al mdico o a la autoridad de salud local antes de buscar atencin mdica.  Evite las zonas pblicas. No viaje en transporte pblico, de ser posible.  Si puede, use un barbijo si debe salir de la casa o si est en contacto cercano con alguien que no est enfermo. Mantenga su casa limpia  Desinfecte los objetos y las superficies que se tocan con frecuencia todos [place]. Pueden incluir: ? Encimeras y [place]. ? Picaportes e interruptores de luz. ? Lavabos, fregaderos y grifos. ? Aparatos electrnicos tales como telfonos, controles remotos, teclados, computadoras y tabletas.  Lave los platos con agua jabonosa caliente o en el lavavajillas.  Deje los platos para que se sequen al aire.  Lave la ropa con agua caliente. Evite infectar a otros miembros de la familia  Permita que los miembros de la familia sanos cuiden a los nios y las [place], si es posible. Si tiene que cuidar a los nios o las mascotas, lvese las manos con frecuencia y use un barbijo.  Duerma en una habitacin o cama diferentes, si es posible.  No comparta elementos personales, como afeitadoras, cepillos de dientes, desodorantes, peines,  cepillos, toallas y toallitas de [place]. Dnde buscar ms informacin Centers for Disease Control and Prevention (CDC)  Actualizaciones de informacin y novedades: https://www.butler-gonzalez.com/ Organizacin Mundial de la Salud (OMS)  Actualizaciones de informacin y novedades: MissExecutive.com.ee  Tema de salud relacionado con el coronavirus: https://www.castaneda.info/  Preguntas y Chartered certified accountant sobre COVID-19: [email or web address].at  Registro mundial: who.sprinklr.com American Academy of Pediatrics (AAP) ([place])  Informacin para familias: www.healthychildren.org/English/health-issues/conditions/chest-lungs/Pages/2019-Novel-Coronavirus.aspx La situacin del coronavirus cambia rpidamente. Consulte el sitio web de su autoridad de [employer] o los sitios web de los CDC y la OMS para enterarse [place] novedades y noticias. Cundo debo comunicarme con un mdico?  Comunquese con su mdico si tiene sntomas de infeccin, como fiebre o tos, y: ? [place] de alguien que sabe que tiene la enfermedad por coronavirus. ? Devota Pace en contacto con una persona que presuntamente sufra de la enfermedad por coronavirus. ? Ha viajado fuera del pas. Cundo debo buscar asistencia mdica inmediata?  Busque ayuda de inmediato llamando al servicio de emergencias de su localidad (911 en los [place]) si tiene lo  siguiente: ? Dificultad para respirar. ? Dolor u opresin en el pecho. ? Confusin. ? Labios y uas de [employer]. ? Dificultad para despertarse. ? Sntomas que empeoran. Informe al personal mdico de emergencias si cree que tiene la enfermedad por coronavirus. Resumen  Un nuevo virus respiratorio se propaga de Ardelia Mems persona a otra y causa COVID-19 (enfermedad por coronavirus).  El virus que causa el COVID-19 parece diseminarse fcilmente. Se transmite de [place] persona a otra a travs de las [employer] se despiden al toser o al estornudar.  [place] [employer] y las personas que tienen enfermedades crnicas tienen mayor riesgo de Emergency planning/management officer enfermedad. Si tiene un riesgo ms alto de tener complicaciones, tome Geophysical data processor.  Actualmente, no existe ninguna vacuna para prevenir la enfermedad por coronavirus. No existen medicamentos, como los antibiticos o los antivirales, para tratar el virus.  Puede protegerse y proteger a su familia al lavarse las manos con frecuencia, evitar tocarse la cara y cubrirse al toser y Brewing technologist. Esta informacin no tiene Marine scientist el consejo del mdico. Asegrese de hacerle al mdico cualquier pregunta que tenga. Document Released: 08/18/2018 Document Revised: 08/18/2018 Document Reviewed: 08/18/2018 Elsevier Patient Education  [place] de carbohidratos para la diabetes mellitus en los adultos Carbohydrate Counting for Diabetes Mellitus, Adult  El recuento de carbohidratos es un mtodo para llevar un registro de la cantidad de carbohidratos que se ingieren. La ingesta natural de carbohidratos aumenta la cantidad de azcar (glucosa) en la sangre. El recuento de la cantidad de carbohidratos que se ingieren sirve para que el nivel de glucosa en sangre permanezca dentro de los lmites [place], lo que ayuda a Theatre manager la diabetes (diabetes mellitus) bajo control. Es importante saber la cantidad de carbohidratos que se  pueden ingerir en cada comida sin correr Engineer, manufacturing. Esto es Psychologist, forensic. Un especialista en alimentacin y nutricin (nutricionista certificado) puede ayudarlo a crear un plan de alimentacin y a calcular la cantidad de carbohidratos que debe ingerir en cada comida y colacin. Los siguientes alimentos incluyen carbohidratos:  Granos, como panes y cereales.  Frijoles secos y productos con soja.  Verduras con almidn, como papas, guisantes y maz.  Lambert Mody y jugos de frutas.  Leche y Estate agent.  Dulces y colaciones, como pasteles, galletas, caramelos, papas fritas de bolsa y refrescos. Cmo se calculan los carbohidratos? [place]  calcular los carbohidratos de los alimentos. Puede usar cualquiera de los dos mtodos o [place] combinacin de [place]. Leer la etiqueta de "informacin nutricional" de los alimentos envasados La lista de "informacin nutricional" est incluida en las etiquetas de casi todas las bebidas y los alimentos envasados de los [place]. Incluye lo siguiente:  El tamao de la porcin.  Informacin sobre los nutrientes de cada porcin, incluidos los gramos (g) de carbohidratos por porcin. Para usar la "informacin nutricional":  Decida cuntas porciones va a comer.  Multiplique la cantidad de porciones por el nmero de carbohidratos por porcin.  El resultado es la cantidad total de carbohidratos que comer. Conocer los tamaos de las porciones estndar de otros alimentos Cuando coma alimentos que contengan carbohidratos y que no estn envasados o no incluyan la "informacin nutricional" en la etiqueta, debe medir las porciones para poder calcular la cantidad de carbohidratos:  Mida los alimentos que comer con una balanza de alimentos o una taza medidora, si es necesario.  Decida cuntas porciones de International aid/development worker.  Multiplique el nmero de porciones por15. La mayora de los alimentos con alto contenido de carbohidratos contienen  unos 15g de carbohidratos por porcin. ? Por ejemplo, si come 8onzas (170g) de fresas, habr comido 2porciones y 30g de carbohidratos (2porciones x 15g=30g).  En el caso de las comidas que contienen mezclas de ms de un alimento, como las sopas y los guisos, debe calcular los carbohidratos de cada alimento que se incluye. La siguiente lista contiene los tamaos de porciones estndar de los alimentos ricos en carbohidratos ms comunes. Cada una de estas porciones tiene aproximadamente 15g de carbohidratos:  pan de hamburguesa o muffin ingls.  onza (57ml) de jarabe.   onza (14g) de mermelada.  1rebanada de pan.  1tortilla de seis pulgadas.  3onzas (85g) de arroz o pasta cocidos.  4onzas (113g) de frijoles secos cocidos.  4onzas (113g) de verduras con almidn, como guisantes, maz o papas.  4onzas (113g) de cereal caliente.  4 onzas (113g) de pur de papas o de una papa grande al horno.  4onzas (113g) de frutas en lata o congeladas.  4onzas (178ml) de jugo de frutas.  4a 6galletas.  6croquetas de pollo.  6onzas (170g) de cereales secos sin azcar.  6onzas (170g) de yogur descremado sin ningn agregado o de yogur endulzado con edulcorante artificial.  8onzas (231ml) de [place].  8 onzas (170g) de frutas frescas o una fruta pequea.  24 onzas (680g) de palomitas de maz. Ejemplo de recuento de carbohidratos Ejemplo de comida  3 onzas (85g) de pechugas de pollo.  6onzas (170g) de arroz integral.  4onzas (113g) de maz.  8onzas (258ml) de leche.  8onzas (170g) de fresas con crema batida sin azcar. Clculo de carbohidratos 1. Identifique los alimentos que contienen carbohidratos: ? Arroz. ? Maz. ? Leche. ? Hughie Closs. 2. Calcule cuntas porciones come de cada alimento: ? 2 porciones de arroz. ? 1 porcin de maz. ? 1 porcin de leche. ? 1 porcin de fresas. 3. Multiplique cada nmero de porciones por  15g: ? 2 porciones de arroz x 15 g = 30 g. ? 1 porcin de maz x 15 g = 15 g. ? 1 porcin de leche x 15 g = 15 g. ? 1 porcin de fresas x 15 g = 15 g. 4. Sume todas las cantidades para conocer el total de gramos de carbohidratos consumidos: ? 30g + 15g + 15g + 15g = 75g de carbohidratos en total. Resumen  El recuento  de carbohidratos es un mtodo para llevar un registro de la cantidad de carbohidratos que se ingieren.  La ingesta natural de carbohidratos aumenta la cantidad de azcar (glucosa) en la sangre.  El recuento de la cantidad de carbohidratos que se ingieren sirve para Advertising account executive de glucosa en sangre dentro de los lmites [place], lo que ayuda a Theatre manager la diabetes bajo control.  Un especialista en alimentacin y nutricin (nutricionista certificado) puede ayudarlo a crear un plan de alimentacin y a calcular la cantidad de carbohidratos que debe ingerir en cada comida y colacin. Esta informacin no tiene Marine scientist el consejo del mdico. Asegrese de hacerle al mdico cualquier pregunta que tenga. Document Released: 07/02/2011 Document Revised: 01/29/2017 Document Reviewed: 09/21/2015 Elsevier Patient Education  2020 [employer].

## 2018-10-19 NOTE — Discharge Summary (Signed)
Physician Discharge Summary  Paula Flores XFG:[id number] DOB: [birth year] DOA: 10/15/2018  PCP: Casilda Carls, MD  Admit date: 10/15/2018 Discharge date: 10/19/2018  Admitted From: Home Disposition: Home   Recommendations for Outpatient Follow-up:  1. Follow up with PCP in 1-2 weeks.  2. Diabetes management. Increased glipizide 5mg  > 10mg  daily and restarted metformin for HbA1c 8.5% and hyperglycemia due to steroids and covid.  3. Follow up HR, due to bradycardia despite holding beta blocker, and no severe-range HTN, metoprolol was held at discharge pending follow up.  Home Health: None Equipment/Devices: None needed Discharge Condition: Stable CODE STATUS: Full Diet recommendation: Carb-modified, heart healthy  Brief/Interim Summary: Paula Flores is a 54 y.o. female with a history of HTN, T2DM, and obesity who presented 6/24 with cough, weakness, fever and worsening shortness of breath having tested positive for coronavirus on 6/13. She presented to ED via EMS early 6/24 due to worsening shortness of breath found to be hypoxic with bilateral CXR infiltrates and confirmed positive coronavirus testing. Admission to [facility] requested. Steroids given, abx given, cultures taken. Hemodynamically stable and satting in mid-low 90%'s on 2.5L O2.On arrival at [facility] she states her shortness of breath is getting worse and is worse with exertion or with prolonged speaking. Remdesivir was started and a dose of tocilizumab administered. Hypoxia and work of breathing have improved. Hypoxia has, in fact, resolved, and lingering cough is her primary complaint at time of discharge. Final dose of remdesivir and steroids is 6/28.  Discharge Diagnoses:  Principal Problem:   Acute respiratory disease due to COVID-19 virus Active Problems:   HTN (hypertension)   Acute respiratory failure with hypoxia (HCC)   T2DM (type 2 diabetes mellitus) (HCC)   Obesity, Class III, BMI 40-49.9 (morbid obesity)  ([place])  Acute hypoxic respiratory failure due to covid-19 pneumonia:Hypoxia resolved. Now 15 days from positive test.  - Completed remdesivir x5 days (6/24 - 6/28). Inflammatory markers trended downward during hospitalization.  - Continue solumedrol 60mg  taper q8h > qd. - Gave tocilizumab 6/24. Monitor LFTs (ALT mildly elevated) and for secondary infections. - Continue self isolation for 2 weeks and/or per national guidelines - Avoid NSAIDs  Sepsis due to covid-19: Lactic acid wnl. PCT undetectable, stopped azithromycin, ceftriaxone. Resolved.  Hypokalemia: K 2.7.  - Replaced, resolved.   Elevated LFTs: Mild, bili wnl. Due to viral infection most likely.  - Monitor at follow up.  HTN:  - Restart home medications except metoprolol.  Sinus bradycardia: Asymptomatic.  - Hold beta blocker at discharge.   NIDT2DM: HbA1c 8.5%.  - Increase glipizide 5mg  > 10mg  and restart metformin.  - Given insulin while admitted but CBGs improving since tapering steroids, do not suspect insulin currently required as outpatient.   Morbid obesity: BMI 48. - Weight loss recommended long term.  Thrombocytosis: Reactive.  - Monitor intermittently.  Constipation:  - Miralax, senna given  Discharge Instructions Discharge Instructions    Diet Carb Modified   Complete by: As directed    Discharge instructions   Complete by: As directed    For diabetes, start taking glipizide 10mg  instead of 5mg  and start taking metformin again.  You may take tylenol for fever or aches, and robitussin syrup or mucinex over the counter to decrease cough.  You are felt to be stable enough to no longer require inpatient monitoring, testing, and treatment, though you will need to follow the recommendations below: - Remain in self isolation for 2 weeks - Do not take NSAID medications (including, but  not limited to, ibuprofen, advil, motrin, naproxen, aleve, goody's powder, etc.) - Follow up with your doctor in  the next week via telehealth or seek medical attention right away if your symptoms get WORSE.  - Consider donating plasma after you have recovered (either 14 days after a negative test or 28 days after symptoms have completely resolved) because your antibodies to this virus may be helpful to give to others with life-threatening infections. Please go to the website www.[email or web address] if you would like to consider volunteering for plasma donation.    Directions for you at home:  Wear a facemask You should wear a facemask that covers your nose and mouth when you are in the same room with other people and when you visit a healthcare provider. People who live with or visit you should also wear a facemask while they are in the same room with you.  Separate yourself from other people in your home As much as possible, you should stay in a different room from other people in your home. Also, you should use a separate bathroom, if available.  Avoid sharing household items You should not share dishes, drinking glasses, cups, eating utensils, towels, bedding, or other items with other people in your home. After using these items, you should wash them thoroughly with soap and water.  Cover your coughs and sneezes Cover your mouth and nose with a tissue when you cough or sneeze, or you can cough or sneeze into your sleeve. Throw used tissues in a lined trash can, and immediately wash your hands with soap and water for at least 20 seconds or use an alcohol-based hand rub.  Wash your [facility] your hands often and thoroughly with soap and water for at least 20 seconds. You can use an alcohol-based hand sanitizer if soap and water are not available and if your hands are not visibly dirty. Avoid touching your eyes, nose, and mouth with unwashed hands.  Directions for those who live with, or provide care at home for you:  Limit the number of people who have contact with the patient If possible, have only  one caregiver for the patient. Other household members should stay in another home or place of residence. If this is not possible, they should stay in another room, or be separated from the patient as much as possible. Use a separate bathroom, if available. Restrict visitors who do not have an essential need to be in the home.  Ensure good ventilation Make sure that shared spaces in the home have good air flow, such as from an air conditioner or an opened window, weather permitting.  Wash your hands often Wash your hands often and thoroughly with soap and water for at least 20 seconds. You can use an alcohol based hand sanitizer if soap and water are not available and if your hands are not visibly dirty. Avoid touching your eyes, nose, and mouth with unwashed hands. Use disposable paper towels to dry your hands. If not available, use dedicated cloth towels and replace them when they become wet.  Wear a facemask and gloves Wear a disposable facemask at all times in the room and gloves when you touch or have contact with the patient's blood, body fluids, and/or secretions or excretions, such as sweat, saliva, sputum, nasal mucus, vomit, urine, or feces.  Ensure the mask fits over your nose and mouth tightly, and do not touch it during use. Throw out disposable facemasks and gloves after using them. Do not reuse.  Wash your hands immediately after removing your facemask and gloves. If your personal clothing becomes contaminated, carefully remove clothing and launder. Wash your hands after handling contaminated clothing. Place all used disposable facemasks, gloves, and other waste in a lined container before disposing them with other household waste. Remove gloves and wash your hands immediately after handling these items.  Do not share dishes, glasses, or other household items with the patient Avoid sharing household items. You should not share dishes, drinking glasses, cups, eating utensils,  towels, bedding, or other items with a patient who is confirmed to have, or being evaluated for, COVID-19 infection. After the person uses these items, you should wash them thoroughly with soap and water.  Wash laundry thoroughly Immediately remove and wash clothes or bedding that have blood, body fluids, and/or secretions or excretions, such as sweat, saliva, sputum, nasal mucus, vomit, urine, or feces, on them. Wear gloves when handling laundry from the patient. Read and follow directions on labels of laundry or clothing items and detergent. In general, wash and dry with the warmest temperatures recommended on the label.  Clean all areas the individual has used often Clean all touchable surfaces, such as counters, tabletops, doorknobs, bathroom fixtures, toilets, phones, keyboards, tablets, and bedside tables, every day. Also, clean any surfaces that may have blood, body fluids, and/or secretions or excretions on them. Wear gloves when cleaning surfaces the patient has come in contact with. Use a diluted bleach solution (e.g., dilute bleach with 1 part bleach and 10 parts water) or a household disinfectant with a label that says EPA-registered for coronaviruses. To make a bleach solution at home, add 1 tablespoon of bleach to 1 quart (4 cups) of water. For a larger supply, add  cup of bleach to 1 gallon (16 cups) of water. Read labels of cleaning products and follow recommendations provided on product labels. Labels contain instructions for safe and effective use of the cleaning product including precautions you should take when applying the product, such as wearing gloves or eye protection and making sure you have good ventilation during use of the product. Remove gloves and wash hands immediately after cleaning.  Monitor yourself for signs and symptoms of illness Caregivers and household members are considered close contacts, should monitor their health, and will be asked to limit movement  outside of the home to the extent possible. Follow the monitoring steps for close contacts listed on the symptom monitoring form.   If you have additional questions, contact your local health department or call the epidemiologist on call at [phone] (available 24/7). This guidance is subject to change. For the most up-to-date guidance from [facility], please refer to their website: YouBlogs.pl   Increase activity slowly   Complete by: As directed    MyChart COVID-19 home monitoring program   Complete by: Oct 19, 2018    Is the patient willing to use the [place] for home monitoring?: Yes   Temperature monitoring   Complete by: Oct 19, 2018    After how many days would you like to receive a notification of this patient's flowsheet entries?: 1     Allergies as of 10/19/2018      Reactions   Penicillins Rash   Did it involve swelling of the face/tongue/throat, SOB, or low BP? N Did it involve sudden or severe rash/hives, skin peeling, or any reaction on the inside of your mouth or nose? Y Did you need to seek medical attention at a hospital or doctor's office? N  When did it last happen?childhood If all above answers are "NO", may proceed with cephalosporin use.      Medication List    STOP taking these medications   etodolac 400 MG 24 hr tablet Commonly known as: LODINE XL   HYDROcodone-acetaminophen 10-325 MG tablet Commonly known as: NORCO   methylPREDNISolone 4 MG Tbpk tablet Commonly known as: MEDROL DOSEPAK   metoprolol tartrate 25 MG tablet Commonly known as: LOPRESSOR   traMADol 50 MG tablet Commonly known as: ULTRAM     TAKE these medications   amLODipine 5 MG tablet Commonly known as: NORVASC   chlorthalidone 25 MG tablet Commonly known as: HYGROTON Take 25 mg by mouth daily.   glipiZIDE 5 MG 24 hr tablet Commonly known as: GLUCOTROL XL Take 2 tablets (10 mg total) by mouth  daily. What changed: how much to take   guaiFENesin-dextromethorphan 100-10 MG/5ML syrup Commonly known as: ROBITUSSIN DM Take 10 mLs by mouth every 4 (four) hours as needed for cough.   losartan 100 MG tablet Commonly known as: COZAAR Take 100 mg by mouth daily.   metFORMIN 500 MG tablet Commonly known as: Glucophage Take 1 tablet (500 mg total) by mouth 2 (two) times daily with a meal for 30 days.      Follow-up Information    Casilda Carls, MD. Schedule an appointment as soon as possible for a visit in 1 week(s).   Specialty: Internal Medicine Contact information: [address] [place] [address] [phone]          Allergies  Allergen Reactions  . Penicillins Rash    Did it involve swelling of the face/tongue/throat, SOB, or low BP? N Did it involve sudden or severe rash/hives, skin peeling, or any reaction on the inside of your mouth or nose? Y Did you need to seek medical attention at a hospital or doctor's office? N When did it last happen?childhood If all above answers are "NO", may proceed with cephalosporin use.     Consultations:  None  Procedures/Studies: Dg Chest Port 1 View  Result Date: 10/15/2018 CLINICAL DATA:  Shortness of breath.  COVID-19. EXAM: PORTABLE CHEST 1 VIEW COMPARISON:  03/17/2018 FINDINGS: Normal heart size and mediastinal contours. Patchy bilateral infiltrate correlating with the history. No effusion or pneumothorax. IMPRESSION: Bilateral pneumonia correlating with history of COVID-19. Electronically Signed   By: Monte Fantasia M.D.   On: 10/15/2018 05:52      Subjective: Breathing is better, still some shortness of breath when walking but no hypoxia. No chest pain. Has nonproductive cough which is lingering. Blood sugars better. Spanish video interpretor used throughout encounter.  Discharge Exam: Vitals:   10/18/18 2156 10/19/18 0650  BP: (!) 167/83 (!) 153/90  Pulse: (!) 45 (!) 49  Resp: 20 18  Temp:   98.5 F (36.9 C)  SpO2: 96% 97%   General: Pt is alert, awake, not in acute distress Cardiovascular: RRR, S1/S2 +, no rubs, no gallops Respiratory: CTA bilaterally, no wheezing, no rhonchi Abdominal: Soft, NT, ND, bowel sounds + Extremities: No edema, no cyanosis  Labs: BNP (last 3 results) No results for input(s): BNP in the last 8760 hours. Basic Metabolic Panel: Recent Labs  Lab 10/15/18 0521 10/16/18 0353 10/17/18 0300 10/18/18 0310 10/19/18 0245  NA 141 138 140 145 143  K 2.7* 4.3 4.5 4.4 4.2  CL 99 101 105 100 105  CO2 30 26 25 26 30   GLUCOSE 168* 276* 283* 266* 173*  BUN 8 14  25* 27* 28*  CREATININE 0.75 0.68 0.68 0.65 0.63  CALCIUM 8.6* 8.7* 8.8* 9.5 8.7*  MG  --  2.4  --   --   --    Liver Function Tests: Recent Labs  Lab 10/15/18 0521 10/16/18 0353 10/17/18 0300 10/18/18 0310 10/19/18 0245  AST 64* 41 25 20 22   ALT 65* 66* 56* 47* 44  ALKPHOS 67 70 59 63 56  BILITOT 0.7 0.3 0.2* 0.2* 0.3  PROT 7.6 7.6 7.0 6.9 6.6  ALBUMIN 3.3* 3.2* 3.0* 3.0* 3.0*   No results for input(s): LIPASE, AMYLASE in the last 168 hours. No results for input(s): AMMONIA in the last 168 hours. CBC: Recent Labs  Lab 10/15/18 0521 10/16/18 0353 10/17/18 0300 10/18/18 0310 10/19/18 0245  WBC 6.3 3.2* 8.5 9.4 8.9  NEUTROABS 4.8 2.2 7.2 7.9* 6.4  HGB 12.6 12.1 11.9* 12.0 12.7  HCT 38.7 39.2 37.8 37.8 40.2  MCV 80.6 83.2 83.6 83.3 82.9  PLT 379 419* 459* 461* 474*   Cardiac Enzymes: No results for input(s): CKTOTAL, CKMB, CKMBINDEX, TROPONINI in the last 168 hours. BNP: Invalid input(s): POCBNP CBG: Recent Labs  Lab 10/18/18 0722 10/18/18 1123 10/18/18 1647 10/18/18 2110 10/19/18 0729  GLUCAP 230* 322* 321* 237* 128*   D-Dimer Recent Labs    10/17/18 0300 10/18/18 0310  DDIMER 0.64* 0.56*   Hgb A1c Recent Labs    10/19/18 0245  HGBA1C 8.5*   Lipid Profile No results for input(s): CHOL, HDL, LDLCALC, TRIG, CHOLHDL, LDLDIRECT in the last 72  hours. Thyroid function studies No results for input(s): TSH, T4TOTAL, T3FREE, THYROIDAB in the last 72 hours.  Invalid input(s): FREET3 Anemia work up Recent Labs    10/17/18 0300 10/18/18 0310  FERRITIN 574* 429*   Urinalysis No results found for: COLORURINE, APPEARANCEUR, [place], [place], GLUCOSEU, [place], [place], [place], [place], [place], NITRITE, [place]  Microbiology Recent Results (from the past 240 hour(s))  Blood Culture (routine x 2)     Status: None (Preliminary result)   Collection Time: 10/15/18  5:21 AM   Specimen: BLOOD  Result Value Ref Range Status   Specimen Description BLOOD LEFT ASSIST CONTROL  Final   Special Requests   Final    BOTTLES DRAWN AEROBIC AND ANAEROBIC Blood Culture results may not be optimal due to an excessive volume of blood received in culture bottles   Culture   Final    NO GROWTH 4 DAYS Performed at [facility], [address], [place], [place] [address]    Report Status PENDING  Incomplete  SARS Coronavirus 2 (CEPHEID- Performed in [place] hospital lab), Hosp Order     Status: Abnormal   Collection Time: 10/15/18  5:21 AM   Specimen: Nasopharyngeal Swab  Result Value Ref Range Status   SARS Coronavirus 2 POSITIVE (A) NEGATIVE Final    Comment: RESULT CALLED TO, READ BACK BY AND VERIFIED WITH: JEN DALEY ON 10/15/2018 AT 0634 QSD (NOTE) If result is NEGATIVE SARS-CoV-2 target nucleic acids are NOT DETECTED. The SARS-CoV-2 RNA is generally detectable in upper and lower  respiratory specimens during the acute phase of infection. The lowest  concentration of SARS-CoV-2 viral copies this assay can detect is 250  copies / mL. A negative result does not preclude SARS-CoV-2 infection  and should not be used as the sole basis for treatment or other  patient management decisions.  A negative result may occur with  improper specimen collection / handling, submission of specimen other  than nasopharyngeal  swab,  presence of viral mutation(s) within the  areas targeted by this assay, and inadequate number of viral copies  (<250 copies / mL). A negative result must be combined with clinical  observations, patient history, and epidemiological information. If result is POSITIVE SARS-CoV-2 target nucleic acids are DETECTED. Th e SARS-CoV-2 RNA is generally detectable in upper and lower  respiratory specimens during the acute phase of infection.  Positive  results are indicative of active infection with SARS-CoV-2.  Clinical  correlation with patient history and other diagnostic information is  necessary to determine patient infection status.  Positive results do  not rule out bacterial infection or co-infection with other viruses. If result is PRESUMPTIVE POSTIVE SARS-CoV-2 nucleic acids MAY BE PRESENT.   A presumptive positive result was obtained on the submitted specimen  and confirmed on repeat testing.  While 2019 novel coronavirus  (SARS-CoV-2) nucleic acids may be present in the submitted sample  additional confirmatory testing may be necessary for epidemiological  and / or clinical management purposes  to differentiate between  SARS-CoV-2 and other Sarbecovirus currently known to infect humans.  If clinically indicated additional testing with an alternate test  methodology [phone]) is  advised. The SARS-CoV-2 RNA is generally  detectable in upper and lower respiratory specimens during the acute  phase of infection. The expected result is Negative. Fact Sheet for Patients:  StrictlyIdeas.no Fact Sheet for Healthcare Providers: BankingDealers.co.za This test is not yet approved or cleared by the [place] FDA and has been authorized for detection and/or diagnosis of SARS-CoV-2 by FDA under an Emergency Use Authorization (EUA).  This EUA will remain in effect (meaning this test can be used) for the duration of the COVID-19 declaration under  Section 564(b)(1) of the Act, 21 U.S.C. section 360bbb-3(b)(1), unless the authorization is terminated or revoked sooner. Performed at [facility], [address] [place], [place] [address]   Blood Culture (routine x 2)     Status: None (Preliminary result)   Collection Time: 10/15/18  5:22 AM   Specimen: BLOOD  Result Value Ref Range Status   Specimen Description BLOOD RIGHT HAND  Final   Special Requests   Final    BOTTLES DRAWN AEROBIC AND ANAEROBIC Blood Culture adequate volume   Culture   Final    NO GROWTH 4 DAYS Performed at [facility], [address] [place], [place] [address]    Report Status PENDING  Incomplete    Time coordinating discharge: Approximately 40 minutes  Patrecia Pour, MD  [facility] 10/19/2018, 10:43 AM

## 2018-10-20 LAB — CULTURE, BLOOD (ROUTINE X 2)
Culture: NO GROWTH
Culture: NO GROWTH
Special Requests: ADEQUATE

## 2018-11-12 ENCOUNTER — Other Ambulatory Visit: Payer: Self-pay

## 2018-11-12 MED ORDER — LIDOCAINE 5 % EX PTCH: 1.0000 | MEDICATED_PATCH | CUTANEOUS | 1 refills | Status: DC

## 2018-11-12 NOTE — Telephone Encounter (Signed)
Pharmacy refill request for Lidocaine patches.  Per Dr. Stephenie Acres verbal order, ok to refill.   Script has been sent to pharmacy

## 2018-12-10 ENCOUNTER — Other Ambulatory Visit: Payer: Self-pay | Admitting: Internal Medicine

## 2018-12-10 ENCOUNTER — Ambulatory Visit: Payer: BC Managed Care – PPO | Admitting: Podiatry

## 2018-12-10 ENCOUNTER — Other Ambulatory Visit: Payer: Self-pay

## 2018-12-10 ENCOUNTER — Ambulatory Visit: Payer: BLUE CROSS/BLUE SHIELD | Admitting: Podiatry

## 2018-12-10 ENCOUNTER — Encounter: Payer: Self-pay | Admitting: Podiatry

## 2018-12-10 VITALS — Temp 98.0°F

## 2018-12-10 DIAGNOSIS — M722 Plantar fascial fibromatosis: Secondary | ICD-10-CM

## 2018-12-10 DIAGNOSIS — R1011 Right upper quadrant pain: Secondary | ICD-10-CM

## 2018-12-10 NOTE — Progress Notes (Signed)
She presents today for chief complaint of a flareup of the plantar fasciitis bilateral foot left worse than the right times the past 2 weeks she reports that she is taking diclofenac and using pain patch as well and that seems to help to some degree he would like to consider new set of orthotics.  Objective: Vital signs are stable she is alert and oriented x3.  Pulses are palpable.  She has severe pain on palpation lateral aspect of the left foot at the lateral calcaneal tubercle and medial aspect of the right foot.  Assessment: Plantar fasciitis.  Plan: I injected the bilateral heel today with 20 mg Kenalog 5 mg Marcaine point of maximal tenderness bilateral foot.  Tolerated procedure well without complications she will follow-up within 2 weeks to see [facility] for orthotics.

## 2018-12-16 ENCOUNTER — Ambulatory Visit
Admission: RE | Admit: 2018-12-16 | Discharge: 2018-12-16 | Disposition: A | Discharge status: Home / Self Care | Payer: BC Managed Care – PPO | Source: Ambulatory Visit | Attending: Internal Medicine | Admitting: Internal Medicine

## 2018-12-16 ENCOUNTER — Other Ambulatory Visit: Payer: Self-pay

## 2018-12-16 DIAGNOSIS — R1011 Right upper quadrant pain: Secondary | ICD-10-CM | POA: Diagnosis present

## 2018-12-24 ENCOUNTER — Other Ambulatory Visit: Payer: Self-pay

## 2018-12-24 ENCOUNTER — Ambulatory Visit: Payer: Self-pay | Admitting: Podiatry

## 2018-12-24 ENCOUNTER — Ambulatory Visit (INDEPENDENT_AMBULATORY_CARE_PROVIDER_SITE_OTHER): Payer: BC Managed Care – PPO | Admitting: Orthotics

## 2018-12-24 DIAGNOSIS — M722 Plantar fascial fibromatosis: Secondary | ICD-10-CM | POA: Diagnosis not present

## 2018-12-24 DIAGNOSIS — G5782 Other specified mononeuropathies of left lower limb: Secondary | ICD-10-CM

## 2018-12-24 DIAGNOSIS — G5762 Lesion of plantar nerve, left lower limb: Secondary | ICD-10-CM

## 2018-12-24 NOTE — Progress Notes (Signed)
Patient came into today for casting bilateral f/o to address plantar fasciitis.  Patient reports history of foot pain involving plantar aponeurosis.  Goal is to provide longitudinal arch support and correct any RF instability due to heel eversion/inversion.  Ultimate goal is to relieve tension at pf insertion calcaneal tuberosity.  Plan on semi-rigid device addressing heel stability and relieving PF tension.      This her second pair and making adjustments:  Deeper heel cup, 2* lateral wedge

## 2019-01-21 ENCOUNTER — Ambulatory Visit: Payer: BC Managed Care – PPO | Admitting: Orthotics

## 2019-01-21 ENCOUNTER — Other Ambulatory Visit: Payer: Self-pay

## 2019-01-21 DIAGNOSIS — M722 Plantar fascial fibromatosis: Secondary | ICD-10-CM

## 2019-01-22 NOTE — Progress Notes (Signed)
Patient came in today to pick up custom made foot orthotics.  The goals were accomplished and the patient reported no dissatisfaction with said orthotics.  Patient was advised of breakin period and how to report any issues. 

## 2019-01-28 ENCOUNTER — Other Ambulatory Visit: Payer: BC Managed Care – PPO | Admitting: Orthotics

## 2019-03-06 ENCOUNTER — Other Ambulatory Visit: Payer: Self-pay | Admitting: Podiatry

## 2019-03-12 ENCOUNTER — Ambulatory Visit: Payer: BC Managed Care – PPO | Admitting: Podiatry

## 2019-03-12 ENCOUNTER — Other Ambulatory Visit: Payer: Self-pay

## 2019-03-12 ENCOUNTER — Encounter: Payer: Self-pay | Admitting: Podiatry

## 2019-03-12 DIAGNOSIS — L600 Ingrowing nail: Secondary | ICD-10-CM | POA: Diagnosis not present

## 2019-03-12 MED ORDER — NEOMYCIN-POLYMYXIN-HC 1 % OT SOLN: OTIC | 1 refills | Status: DC

## 2019-03-12 NOTE — Patient Instructions (Addendum)

## 2019-03-14 NOTE — Progress Notes (Signed)
She presents today chief complaint of painful hallux and fifth nails to the right foot.  States that they are dark and they are painful they are tender with shoes all of them removed permanently.  Objective: Vital signs are stable she is alert and oriented x3 there is no erythema edema cellulitis drainage or odor these nails are thick dystrophic sharply incurvated and painful on palpation.  Assessment: Ingrown toenails painful palpation.  Plan: After local anesthetic was administered to the first and the fifth toes of the right foot I went ahead and permanently removed with matrixectomy's the hallux nail right and the fifth nail plate right.  She tolerated the procedure well.  She was provided with both oral and written home-going instructions in Spanish.  She understands this and is amenable to it.  Also dispensed a prescription for Corticosporin otic to be applied twice daily after soaking.  I will follow-up with her in 2 weeks

## 2019-03-26 ENCOUNTER — Other Ambulatory Visit: Payer: Self-pay

## 2019-03-26 ENCOUNTER — Ambulatory Visit: Payer: BC Managed Care – PPO | Admitting: Podiatry

## 2019-03-26 DIAGNOSIS — L600 Ingrowing nail: Secondary | ICD-10-CM

## 2019-03-28 NOTE — Progress Notes (Signed)
She presents today for follow-up of her matrixectomy's hallux and fifth toe of the right foot.  She states that is doing fine there is no more aching just a little pain on the small toe.  Patient denies swelling drainage with slight redness.  She has been soaking in Epson salts and using the drops.  Denies fever chills nausea vomiting muscle aches pains.  Currently her fasting blood sugar was 114 current A1c is 6.1.  Objective: Vital signs are stable she is alert and oriented x3.  Hallux nail avulsion matrixectomy has gone on to heal up 100% fifth toe demonstrates some area of maceration along the medial border but the rest of the nail bed appears to be healing very nicely.  There is no erythema cellulitis drainage or odor.  Assessment: Well-healing surgical toes 1 and 5 right foot.  Plan: Continue soaking fifth digit until the fifth toe appears similar to that of the hallux.  She understands this and is amenable to it we will follow-up with me in 2 to 3 weeks for reevaluation.

## 2019-04-06 DIAGNOSIS — Z6841 Body Mass Index (BMI) 40.0 and over, adult: Secondary | ICD-10-CM | POA: Insufficient documentation

## 2019-04-13 ENCOUNTER — Encounter: Payer: Self-pay | Admitting: Podiatry

## 2019-04-13 ENCOUNTER — Ambulatory Visit: Payer: BC Managed Care – PPO | Admitting: Podiatry

## 2019-04-13 ENCOUNTER — Other Ambulatory Visit: Payer: Self-pay

## 2019-04-13 DIAGNOSIS — Z9889 Other specified postprocedural states: Secondary | ICD-10-CM

## 2019-04-13 DIAGNOSIS — L600 Ingrowing nail: Secondary | ICD-10-CM

## 2019-04-13 MED ORDER — DOXYCYCLINE HYCLATE 100 MG PO TABS: 100.0000 mg | ORAL_TABLET | Freq: Two times a day (BID) | ORAL | 0 refills | Status: DC

## 2019-04-13 NOTE — Progress Notes (Signed)
Subjective:  Patient ID: Paula Flores, female    DOB: 1965/06/24,  MRN: [id number] HPI Chief Complaint  Patient presents with  . Ingrown Toenail    Follow up nail procedure 1st and 5th nails right   "The little toe is red and big one is nice"    53 y.o. female presents with the above complaint.   ROS: Denies fever chills nausea vomiting muscle aches pains calf pain back pain chest pain shortness of breath.  Past Medical History:  Diagnosis Date  . Anemia   . Arthritis   . Bacterial infection due to H. pylori   . COVID-19 virus detected 6/13/20230  . Diabetes mellitus without complication ([place])   . Hypertension   . Mastitis 2014   Past Surgical History:  Procedure Laterality Date  . ABLATION ON ENDOMETRIOSIS  09/04/2011  . BOWEL RESECTION  1994  . BREAST BIOPSY  11/03/2014  . CESAREAN SECTION    . CHOLECYSTECTOMY  1994   had bowel resection  . FOOT SURGERY Right   . HYSTEROSCOPY WITH D & C N/A 10/13/2015   Procedure: DILATATION AND CURETTAGE /HYSTEROSCOPY;  Surgeon: Malachy Mood, MD;  Location: [facility] ORS;  Service: Gynecology;  Laterality: N/A;  . TONSILLECTOMY    . TUBAL LIGATION  1994    Current Outpatient Medications:  .  amitriptyline (ELAVIL) 25 MG tablet, , Disp: , Rfl:  .  amLODipine (NORVASC) 5 MG tablet, , Disp: , Rfl:  .  benzonatate (TESSALON) 200 MG capsule, TAKE 1 CAPSULE BY MOUTH THREE TIMES A DAY AS NEEDED FOR COUGH FOR UP TO 7 DAYS, Disp: , Rfl:  .  chlorthalidone (HYGROTON) 25 MG tablet, Take 25 mg by mouth daily., Disp: , Rfl:  .  doxycycline (VIBRA-TABS) 100 MG tablet, Take 1 tablet (100 mg total) by mouth 2 (two) times daily., Disp: 20 tablet, Rfl: 0 .  etodolac (LODINE XL) 400 MG 24 hr tablet, TAKE 1 TABLET BY MOUTH TWICE A DAY, Disp: 60 tablet, Rfl: 3 .  glipiZIDE (GLUCOTROL XL) 5 MG 24 hr tablet, Take 2 tablets (10 mg total) by mouth daily., Disp: , Rfl:  .  ketorolac (TORADOL) 10 MG tablet, Take 10 mg by mouth 4 (four) times daily., Disp:  , Rfl:  .  lidocaine (LIDODERM) 5 %, Place 1 patch onto the skin daily. Remove & Discard patch within 12 hours or as directed by MD, Disp: 30 patch, Rfl: 1 .  losartan (COZAAR) 50 MG tablet, Take 50 mg by mouth daily., Disp: , Rfl:  .  metFORMIN (GLUCOPHAGE) 500 MG tablet, Take 1 tablet (500 mg total) by mouth 2 (two) times daily with a meal for 30 days., Disp: 60 tablet, Rfl: 0 .  metoprolol tartrate (LOPRESSOR) 50 MG tablet, Take 50 mg by mouth 2 (two) times daily., Disp: , Rfl:  .  NEOMYCIN-POLYMYXIN-HYDROCORTISONE (CORTISPORIN) 1 % SOLN OTIC solution, Apply 1-2 drops to toe BID after soaking, Disp: 10 mL, Rfl: 1  Allergies  Allergen Reactions  . Penicillins Rash    Did it involve swelling of the face/tongue/throat, SOB, or low BP? N Did it involve sudden or severe rash/hives, skin peeling, or any reaction on the inside of your mouth or nose? Y Did you need to seek medical attention at a hospital or doctor's office? N When did it last happen?childhood If all above answers are "NO", may proceed with cephalosporin use.    Review of Systems Objective:  There were no vitals filed for this visit.  General: Well developed, nourished, in no acute distress, alert and oriented x3   Dermatological: Skin is warm, dry and supple bilateral. Nails x 10 are well maintained; remaining integument appears unremarkable at this time. There are no open sores, no preulcerative lesions, no rash or signs of infection present.  Mildly erythematous edematous toe of the right foot the medial border seems to be the worst as far as tenderness fifth digit right.  More than likely is its juxtaposition to the fourth toe it is resulting in the soreness.  Vascular: Dorsalis Pedis artery and Posterior Tibial artery pedal pulses are 2/4 bilateral with immedate capillary fill time. Pedal hair growth present. No varicosities and no lower extremity edema present bilateral.   Neruologic: Grossly intact via light touch  bilateral. Vibratory intact via tuning fork bilateral. Protective threshold with Semmes Wienstein monofilament intact to all pedal sites bilateral. Patellar and Achilles deep tendon reflexes 2+ bilateral. No Babinski or clonus noted bilateral.   Musculoskeletal: No gross boney pedal deformities bilateral. No pain, crepitus, or limitation noted with foot and ankle range of motion bilateral. Muscular strength 5/5 in all groups tested bilateral.  Gait: Unassisted, Nonantalgic.    Radiographs:  None taken  Assessment & Plan:   Assessment: Paronychia fifth digit right foot status post matrixectomy.  Plan: Start soaking Epson salts and warm water every other day and wrote a prescription for doxycycline 100 mg twice daily follow-up with her in 2 to 3 weeks.     Shailynn Fong [place], [place]

## 2019-04-13 NOTE — Addendum Note (Signed)
Addended by: Tyson Dense T on: 04/13/2019 12:26 PM   Modules accepted: Level of Service

## 2019-05-06 ENCOUNTER — Ambulatory Visit (INDEPENDENT_AMBULATORY_CARE_PROVIDER_SITE_OTHER): Payer: Self-pay | Admitting: Podiatry

## 2019-05-06 ENCOUNTER — Encounter: Payer: Self-pay | Admitting: Podiatry

## 2019-05-06 ENCOUNTER — Other Ambulatory Visit: Payer: Self-pay

## 2019-05-06 DIAGNOSIS — M722 Plantar fascial fibromatosis: Secondary | ICD-10-CM

## 2019-05-06 NOTE — Progress Notes (Signed)
She presents today for follow-up of her matrixectomy fifth toe right foot and she states that the mild left heel is still feeling tender.  She states that the fifth toe is doing much better she is very happy with the outcome thus far.  Objective: Vital signs are stable alert and oriented x3.  Pulses are palpable.  There is no erythema edema cellulitis drainage or odor to the fifth toe though she does have pain on palpation medial calcaneal tubercle of the left foot.  Assessment: Resolving matrixectomy fifth digit right foot plantar fasciitis left foot.  Plan: Continue to soak the right foot Epson salts and warm water every other day cover during the day with a Band-Aid leave open at bedtime.  Continue Neosporin or Corticosporin otic drops.  I injected her left heel 20 mg Kenalog 5 mg Marcaine point of maximal tenderness of her left heel.  Tolerated procedure well without complications we will follow-up with her in about a month.

## 2019-09-23 ENCOUNTER — Encounter: Payer: Self-pay | Admitting: Podiatry

## 2019-09-23 ENCOUNTER — Ambulatory Visit: Payer: Self-pay | Admitting: Podiatry

## 2019-09-23 ENCOUNTER — Other Ambulatory Visit: Payer: Self-pay

## 2019-09-23 DIAGNOSIS — M722 Plantar fascial fibromatosis: Secondary | ICD-10-CM

## 2019-09-23 MED ORDER — METHYLPREDNISOLONE 4 MG PO TBPK: ORAL_TABLET | ORAL | 0 refills | Status: DC

## 2019-09-25 NOTE — Progress Notes (Signed)
She presents today chief complaint of pain across the top of the foot and the bottom.  States that the plantar portion of the left hurts worse than that of the right and notices swelling with itching bilaterally.  Objective: Vital signs are stable alert oriented x3.  She has pain on palpation medial care tubercles bilaterally.  Assessment: Moderate edema bilateral with dermatitis associated to the edema.  She also has chronic intractable plantar fasciitis a little more distally in the medial longitudinal arch on the left foot than that of the right.  She has some tenderness along the dorsal aspect of the right.  Plan: Discussed etiology pathology conservative therapy at this point time and reinjected bilateral heels 20 mg Kenalog 5 mg Marcaine point maximal tenderness.  Encouraged her to get the swelling down from her legs with elevation and compression hose which would help with the itching and the pain in the legs and feet.  Also started her on methylprednisolone we will follow-up with her in in 6 weeks.

## 2019-10-06 ENCOUNTER — Other Ambulatory Visit: Payer: Self-pay | Admitting: Podiatry

## 2019-10-09 ENCOUNTER — Ambulatory Visit: Payer: Self-pay | Admitting: Obstetrics and Gynecology

## 2019-10-16 ENCOUNTER — Other Ambulatory Visit (HOSPITAL_COMMUNITY)
Admission: RE | Admit: 2019-10-16 | Discharge: 2019-10-16 | Disposition: A | Discharge status: Home / Self Care | Payer: BC Managed Care – PPO | Source: Ambulatory Visit | Attending: Obstetrics and Gynecology | Admitting: Obstetrics and Gynecology

## 2019-10-16 ENCOUNTER — Ambulatory Visit (INDEPENDENT_AMBULATORY_CARE_PROVIDER_SITE_OTHER): Payer: BC Managed Care – PPO | Admitting: Obstetrics and Gynecology

## 2019-10-16 ENCOUNTER — Other Ambulatory Visit: Payer: Self-pay

## 2019-10-16 ENCOUNTER — Encounter: Payer: Self-pay | Admitting: Obstetrics and Gynecology

## 2019-10-16 VITALS — BP 136/90 | Ht 66.0 in | Wt 327.0 lb

## 2019-10-16 DIAGNOSIS — Z1239 Encounter for other screening for malignant neoplasm of breast: Secondary | ICD-10-CM

## 2019-10-16 DIAGNOSIS — B3731 Acute candidiasis of vulva and vagina: Secondary | ICD-10-CM

## 2019-10-16 DIAGNOSIS — R300 Dysuria: Secondary | ICD-10-CM | POA: Diagnosis not present

## 2019-10-16 DIAGNOSIS — Z01419 Encounter for gynecological examination (general) (routine) without abnormal findings: Secondary | ICD-10-CM | POA: Diagnosis not present

## 2019-10-16 DIAGNOSIS — Z124 Encounter for screening for malignant neoplasm of cervix: Secondary | ICD-10-CM | POA: Diagnosis not present

## 2019-10-16 DIAGNOSIS — Z1211 Encounter for screening for malignant neoplasm of colon: Secondary | ICD-10-CM

## 2019-10-16 DIAGNOSIS — B373 Candidiasis of vulva and vagina: Secondary | ICD-10-CM

## 2019-10-16 LAB — POCT URINALYSIS DIPSTICK
Bilirubin, UA: NEGATIVE
Blood, UA: NEGATIVE
Glucose, UA: NEGATIVE
Ketones, UA: NEGATIVE
Leukocytes, UA: NEGATIVE
Nitrite, UA: NEGATIVE
Protein, UA: POSITIVE — AB
Spec Grav, UA: 1.025 (ref 1.010–1.025)
Urobilinogen, UA: NEGATIVE E.U./dL — AB
pH, UA: 5 (ref 5.0–8.0)

## 2019-10-16 MED ORDER — NITROFURANTOIN MONOHYD MACRO 100 MG PO CAPS: 100.0000 mg | ORAL_CAPSULE | Freq: Two times a day (BID) | ORAL | 0 refills | Status: AC

## 2019-10-16 MED ORDER — TERCONAZOLE 0.4 % VA CREA: 1.0000 | TOPICAL_CREAM | Freq: Every day | VAGINAL | 1 refills | Status: DC

## 2019-10-16 NOTE — Patient Instructions (Signed)
[facility] [address] [place] [address]  [facility]  [address] [place] [address]  Phone: [phone]  

## 2019-10-16 NOTE — Progress Notes (Signed)
Gynecology Annual Exam  PCP: Casilda Carls, MD  Chief Complaint:  Chief Complaint  Patient presents with  . Gynecologic Exam  . Urinary Tract Infection    Pain pressure burning when urinates    History of Present Illness:Patient is a 54 y.o. [id number] presents for annual exam. The patient has no complaints today.   LMP: No LMP recorded. Patient has had an ablation.   The patient is sexually active. She denies dyspareunia.  The patient does perform self breast exams.  There is no notable family history of breast or ovarian cancer in her family.  The patient wears seatbelts: yes.   The patient has regular exercise: not asked.    The patient denies current symptoms of depression.     Review of Systems: Review of Systems  Constitutional: Negative for chills and fever.  HENT: Negative for congestion.   Respiratory: Negative for cough and shortness of breath.   Cardiovascular: Negative for chest pain and palpitations.  Gastrointestinal: Negative for abdominal pain, constipation, diarrhea, heartburn, nausea and vomiting.  Genitourinary: Negative for dysuria, frequency and urgency.  Musculoskeletal: Positive for back pain and joint pain.  Skin: Negative for itching and rash.  Neurological: Negative for dizziness and headaches.  Endo/Heme/Allergies: Negative for polydipsia.  Psychiatric/Behavioral: Negative for depression.    Past Medical History:  Patient Active Problem List   Diagnosis Date Noted  . Acute respiratory disease due to COVID-19 virus 10/15/2018  . Acute respiratory failure with hypoxia ([place]) 10/15/2018  . T2DM (type 2 diabetes mellitus) ([place]) 10/15/2018  . Obesity, Class III, BMI 40-49.9 (morbid obesity) ([place]) 10/15/2018  . Shoulder pain 05/25/2016  . HTN (hypertension) 07/06/2015  . 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 Lump or mass in breast 10/27/2012  . Mastitis 10/27/2012    Past Surgical History:  Past Surgical History:  Procedure  Laterality Date  . ABLATION ON ENDOMETRIOSIS  09/04/2011  . BOWEL RESECTION  1994  . BREAST BIOPSY  11/03/2014  . CESAREAN SECTION    . CHOLECYSTECTOMY  1994   had bowel resection  . FOOT SURGERY Right   . HYSTEROSCOPY WITH D & C N/A 10/13/2015   Procedure: DILATATION AND CURETTAGE /HYSTEROSCOPY;  Surgeon: Malachy Mood, MD;  Location: [facility] ORS;  Service: Gynecology;  Laterality: N/A;  . TONSILLECTOMY    . TUBAL LIGATION  1994    Gynecologic History:  No LMP recorded. Patient has had an ablation. Last Pap: Results were: 09/14/2015 NIL and HR HPV negative   Obstetric History: [id number]  Family History:  Family History  Problem Relation Age of Onset  . Cervical cancer Maternal Grandmother     Social History:  Social History   Socioeconomic History  . Marital status: Married    Spouse name: Not on file  . Number of children: Not on file  . Years of education: Not on file  . Highest education level: Not on file  Occupational History  . Not on file  Tobacco Use  . Smoking status: Never Smoker  . Smokeless tobacco: Never Used  Vaping Use  . Vaping Use: Never used  Substance and Sexual Activity  . Alcohol use: Yes    Comment: OCCASIONALLY  . Drug use: No  . Sexual activity: Yes    Birth control/protection: None    Comment: Ablation  Other Topics Concern  . Not on file  Social History Narrative  . Not on file   Social Determinants of Health   Financial Resource Strain:   .  Difficulty of Paying Living Expenses:   Food Insecurity:   . Worried About Charity fundraiser in the Last Year:   . Arboriculturist in the Last Year:   Transportation Needs:   . Film/video editor (Medical):   Marland Kitchen Lack of Transportation (Non-Medical):   Physical Activity:   . Days of Exercise per Week:   . Minutes of Exercise per Session:   Stress:   . Feeling of Stress :   Social Connections:   . Frequency of Communication with Friends and Family:   . Frequency of Social Gatherings  with Friends and Family:   . Attends Religious Services:   . Active Member of Clubs or Organizations:   . Attends Archivist Meetings:   Marland Kitchen Marital Status:   Intimate Partner Violence:   . Fear of Current or Ex-Partner:   . Emotionally Abused:   Marland Kitchen Physically Abused:   . Sexually Abused:     Allergies:  Allergies  Allergen Reactions  . Penicillins Rash    Did it involve swelling of the face/tongue/throat, SOB, or low BP? N Did it involve sudden or severe rash/hives, skin peeling, or any reaction on the inside of your mouth or nose? Y Did you need to seek medical attention at a hospital or doctor's office? N When did it last happen?childhood If all above answers are "NO", may proceed with cephalosporin use.     Medications: Prior to Admission medications   Medication Sig Start Date End Date Taking? Authorizing Provider  atorvastatin (LIPITOR) 10 MG tablet Take 10 mg by mouth daily. 09/18/19  Yes [provider]  chlorthalidone (HYGROTON) 25 MG tablet Take 25 mg by mouth daily.   Yes [provider]  glipiZIDE (GLUCOTROL XL) 5 MG 24 hr tablet Take 2 tablets (10 mg total) by mouth daily. 10/19/18  Yes Patrecia Pour, MD  losartan (COZAAR) 50 MG tablet Take 50 mg by mouth daily. 03/05/19  Yes [provider]  metFORMIN (GLUCOPHAGE) 500 MG tablet Take 1 tablet (500 mg total) by mouth 2 (two) times daily with a meal for 30 days. 10/19/18 10/16/19 Yes Patrecia Pour, MD  metoprolol tartrate (LOPRESSOR) 50 MG tablet Take 50 mg by mouth 2 (two) times daily. 01/20/19  Yes [provider]    Physical Exam Vitals: Blood pressure 136/90, height 5\' 6"  (1.676 m), weight (!) 327 lb (148.3 kg).  General: NAD HEENT: normocephalic, anicteric Thyroid: no enlargement, no palpable nodules Pulmonary: No increased work of breathing, CTAB Cardiovascular: RRR, distal pulses 2+ Breast: Breast symmetrical, no tenderness, no palpable nodules or masses, no skin  or nipple retraction present, no nipple discharge.  No axillary or supraclavicular lymphadenopathy. Abdomen: NABS, soft, non-tender, non-distended.  Umbilicus without lesions.  No hepatomegaly, splenomegaly or masses palpable. No evidence of hernia  Genitourinary:  External: Normal external female genitalia.  Normal urethral meatus, normal Bartholin's and Skene's glands.    Vagina: Normal vaginal mucosa, no evidence of prolapse.    Cervix: Grossly normal in appearance, no bleeding  Uterus: Non-enlarged, mobile, normal contour.  No CMT  Adnexa: ovaries non-enlarged, no adnexal masses  Rectal: deferred  Lymphatic: no evidence of inguinal lymphadenopathy Extremities: no edema, erythema, or tenderness Neurologic: Grossly intact Psychiatric: mood appropriate, affect full  Female chaperone present for pelvic and breast  portions of the physical exam     Assessment: 54 y.o. [id number] routine annual exam  Plan: Problem List Items Addressed This Visit    None  Visit Diagnoses    Encounter for gynecological examination without abnormal finding    -  Primary   Screening for malignant neoplasm of cervix       Relevant Orders   Cytology - PAP   Breast screening       Relevant Orders   MM 3D SCREEN BREAST BILATERAL   Dysuria       Relevant Orders   Urine Culture   POCT urinalysis dipstick (Completed)   Colon cancer screening       Relevant Orders   Ambulatory referral to Gastroenterology   Vaginal candida       Relevant Medications   nitrofurantoin, macrocrystal-monohydrate, (MACROBID) 100 MG capsule      1) Mammogram - recommend yearly screening mammogram.  Mammogram Was ordered today  2) STI screening  was notoffered and therefore not obtained  3) ASCCP guidelines and rational discussed.  Patient opts for every 3 years screening interval  4) Osteoporosis  - per USPTF routine screening DEXA at age 89  5) Routine healthcare maintenance including cholesterol, diabetes screening  discussed managed by PCP  6) Colonoscopy - ordered  7) Dysuria - UCx rx for macrobid  8) Vaginal candida - Rx for terzol 7  9) Return in about 1 year (around 10/15/2020) for annual.    Malachy Mood, MD [place] P[place], [facility] 10/16/2019, 9:47 AM

## 2019-10-18 LAB — URINE CULTURE

## 2019-10-19 LAB — CYTOLOGY - PAP
Comment: NEGATIVE
Diagnosis: NEGATIVE
High risk HPV: NEGATIVE

## 2019-11-05 DIAGNOSIS — M79676 Pain in unspecified toe(s): Secondary | ICD-10-CM

## 2019-11-09 ENCOUNTER — Ambulatory Visit: Payer: BC Managed Care – PPO | Admitting: Podiatry

## 2019-11-11 ENCOUNTER — Telehealth (INDEPENDENT_AMBULATORY_CARE_PROVIDER_SITE_OTHER): Payer: Self-pay | Admitting: Gastroenterology

## 2019-11-11 ENCOUNTER — Other Ambulatory Visit: Payer: Self-pay

## 2019-11-11 DIAGNOSIS — Z1211 Encounter for screening for malignant neoplasm of colon: Secondary | ICD-10-CM

## 2019-11-11 MED ORDER — PEG 3350-KCL-NA BICARB-NACL 420 G PO SOLR: 4000.0000 mL | Freq: Once | ORAL | 0 refills | Status: AC

## 2019-11-11 NOTE — Progress Notes (Signed)
Gastroenterology Pre-Procedure Review  Request Date: Tuesday 11/24/19 Requesting Physician: Dr. Bonna Gains  PATIENT REVIEW QUESTIONS: The patient responded to the following health history questions as indicated:    1. Are you having any GI issues? no 2. Do you have a personal history of Polyps? no 3. Do you have a family history of Colon Cancer or Polyps? no 4. Diabetes Mellitus? yes (oral meds) 5. Joint replacements in the past 12 months?no 6. Major health problems in the past 3 months?no 7. Any artificial heart valves, MVP, or defibrillator?no    MEDICATIONS & ALLERGIES:    Patient reports the following regarding taking any anticoagulation/antiplatelet therapy:   Plavix, Coumadin, Eliquis, Xarelto, Lovenox, Pradaxa, Brilinta, or Effient? no Aspirin? no  Patient confirms/reports the following medications:  Current Outpatient Medications  Medication Sig Dispense Refill  . atorvastatin (LIPITOR) 10 MG tablet Take 10 mg by mouth daily.    . chlorthalidone (HYGROTON) 25 MG tablet Take 25 mg by mouth daily.    Marland Kitchen etodolac (LODINE) 400 MG tablet Take 400 mg by mouth 2 (two) times daily as needed.    Marland Kitchen glipiZIDE (GLUCOTROL XL) 5 MG 24 hr tablet Take 2 tablets (10 mg total) by mouth daily.    Marland Kitchen losartan (COZAAR) 100 MG tablet Take 100 mg by mouth daily.    . metFORMIN (GLUCOPHAGE) 500 MG tablet Take 1 tablet (500 mg total) by mouth 2 (two) times daily with a meal for 30 days. 60 tablet 0  . metoprolol tartrate (LOPRESSOR) 50 MG tablet Take 50 mg by mouth 2 (two) times daily.    . polyethylene glycol-electrolytes (NULYTELY) 420 g solution Take 4,000 mLs by mouth once for 1 dose. 4000 mL 0  . terconazole (TERAZOL 7) 0.4 % vaginal cream Place 1 applicator vaginally at bedtime. 45 g 1   No current facility-administered medications for this visit.    Patient confirms/reports the following allergies:  Allergies  Allergen Reactions  . Penicillins Rash    Did it involve swelling of the  face/tongue/throat, SOB, or low BP? N Did it involve sudden or severe rash/hives, skin peeling, or any reaction on the inside of your mouth or nose? Y Did you need to seek medical attention at a hospital or doctor's office? N When did it last happen?childhood If all above answers are "NO", may proceed with cephalosporin use.     No orders of the defined types were placed in this encounter.   AUTHORIZATION INFORMATION Primary Insurance: 1D#: Group #:  Secondary Insurance: 1D#: Group #:  SCHEDULE INFORMATION: Date: 11/24/19 Time: Location:[facility]

## 2019-11-11 NOTE — Progress Notes (Signed)
Gastroenterology Pre-Procedure Review  Request Date: Tuesday 11/24/19 Requesting Physician: Dr. Bonna Gains  PATIENT REVIEW QUESTIONS: The patient responded to the following health history questions as indicated:    1. Are you having any GI issues? no 2. Do you have a personal history of Polyps? no 3. Do you have a family history of Colon Cancer or Polyps? no 4. Diabetes Mellitus? yes (oral meds) 5. Joint replacements in the past 12 months?no 6. Major health problems in the past 3 months?no 7. Any artificial heart valves, MVP, or defibrillator?no    MEDICATIONS & ALLERGIES:    Patient reports the following regarding taking any anticoagulation/antiplatelet therapy:   Plavix, Coumadin, Eliquis, Xarelto, Lovenox, Pradaxa, Brilinta, or Effient? no Aspirin? no  Patient confirms/reports the following medications:  Current Outpatient Medications  Medication Sig Dispense Refill  . atorvastatin (LIPITOR) 10 MG tablet Take 10 mg by mouth daily.    . chlorthalidone (HYGROTON) 25 MG tablet Take 25 mg by mouth daily.    Marland Kitchen glipiZIDE (GLUCOTROL XL) 5 MG 24 hr tablet Take 2 tablets (10 mg total) by mouth daily.    Marland Kitchen losartan (COZAAR) 50 MG tablet Take 50 mg by mouth daily.    . metFORMIN (GLUCOPHAGE) 500 MG tablet Take 1 tablet (500 mg total) by mouth 2 (two) times daily with a meal for 30 days. 60 tablet 0  . metoprolol tartrate (LOPRESSOR) 50 MG tablet Take 50 mg by mouth 2 (two) times daily.    . polyethylene glycol-electrolytes (NULYTELY) 420 g solution Take 4,000 mLs by mouth once for 1 dose. 4000 mL 0  . terconazole (TERAZOL 7) 0.4 % vaginal cream Place 1 applicator vaginally at bedtime. 45 g 1   No current facility-administered medications for this visit.    Patient confirms/reports the following allergies:  Allergies  Allergen Reactions  . Penicillins Rash    Did it involve swelling of the face/tongue/throat, SOB, or low BP? N Did it involve sudden or severe rash/hives, skin peeling, or  any reaction on the inside of your mouth or nose? Y Did you need to seek medical attention at a hospital or doctor's office? N When did it last happen?childhood If all above answers are "NO", may proceed with cephalosporin use.     Orders Placed This Encounter  Procedures  . Procedural/ Surgical Case Request: COLONOSCOPY WITH PROPOFOL    Standing Status:   Standing    Number of Occurrences:   1    Order Specific Question:   Pre-op diagnosis    Answer:   Screening colonoscopy    Order Specific Question:   CPT Code    Answer:   91694    AUTHORIZATION INFORMATION Primary Insurance: 1D#: Group #:  Secondary Insurance: 1D#: Group #:  SCHEDULE INFORMATION: Date: Tuesday 11/24/19 Time: Location:[facility]

## 2019-11-20 ENCOUNTER — Other Ambulatory Visit
Admission: RE | Admit: 2019-11-20 | Discharge: 2019-11-20 | Disposition: A | Discharge status: Home / Self Care | Payer: BC Managed Care – PPO | Source: Ambulatory Visit | Attending: Gastroenterology | Admitting: Gastroenterology

## 2019-11-20 ENCOUNTER — Other Ambulatory Visit: Payer: Self-pay

## 2019-11-20 DIAGNOSIS — Z01812 Encounter for preprocedural laboratory examination: Secondary | ICD-10-CM | POA: Insufficient documentation

## 2019-11-20 DIAGNOSIS — Z20822 Contact with and (suspected) exposure to covid-19: Secondary | ICD-10-CM | POA: Insufficient documentation

## 2019-11-20 LAB — SARS CORONAVIRUS 2 (TAT 6-24 HRS): SARS Coronavirus 2: NEGATIVE

## 2019-11-23 ENCOUNTER — Other Ambulatory Visit: Payer: Self-pay

## 2019-11-23 ENCOUNTER — Telehealth: Payer: Self-pay | Admitting: Gastroenterology

## 2019-11-23 DIAGNOSIS — Z1211 Encounter for screening for malignant neoplasm of colon: Secondary | ICD-10-CM

## 2019-11-23 DIAGNOSIS — M7582 Other shoulder lesions, left shoulder: Secondary | ICD-10-CM | POA: Insufficient documentation

## 2019-11-23 DIAGNOSIS — S46012A Strain of muscle(s) and tendon(s) of the rotator cuff of left shoulder, initial encounter: Secondary | ICD-10-CM | POA: Insufficient documentation

## 2019-11-23 MED ORDER — PEG 3350-KCL-NA BICARB-NACL 420 G PO SOLR
4000.0000 mL | Freq: Once | ORAL | 0 refills | Status: AC
Start: 2019-11-23 — End: 2019-11-23

## 2019-11-23 NOTE — Telephone Encounter (Signed)
Called and stated that her prescription for the procedure has not been sent to her pharmacy and she needs it because her procedure is tomorrow. Clinical staff was informed

## 2019-11-24 ENCOUNTER — Encounter
Admission: RE | Disposition: A | Discharge status: Home / Self Care | Payer: Self-pay | Source: Home / Self Care | Attending: Gastroenterology

## 2019-11-24 ENCOUNTER — Ambulatory Visit: Payer: BC Managed Care – PPO | Admitting: Certified Registered Nurse Anesthetist

## 2019-11-24 ENCOUNTER — Encounter: Payer: Self-pay | Admitting: Gastroenterology

## 2019-11-24 ENCOUNTER — Ambulatory Visit
Admission: RE | Admit: 2019-11-24 | Discharge: 2019-11-24 | Disposition: A | Discharge status: Home / Self Care | Payer: BC Managed Care – PPO | Attending: Gastroenterology | Admitting: Gastroenterology

## 2019-11-24 DIAGNOSIS — K635 Polyp of colon: Secondary | ICD-10-CM | POA: Diagnosis not present

## 2019-11-24 DIAGNOSIS — Z79899 Other long term (current) drug therapy: Secondary | ICD-10-CM | POA: Insufficient documentation

## 2019-11-24 DIAGNOSIS — Z8616 Personal history of COVID-19: Secondary | ICD-10-CM | POA: Diagnosis not present

## 2019-11-24 DIAGNOSIS — Z7984 Long term (current) use of oral hypoglycemic drugs: Secondary | ICD-10-CM | POA: Diagnosis not present

## 2019-11-24 DIAGNOSIS — Z9049 Acquired absence of other specified parts of digestive tract: Secondary | ICD-10-CM | POA: Insufficient documentation

## 2019-11-24 DIAGNOSIS — Z880 Allergy status to penicillin: Secondary | ICD-10-CM | POA: Diagnosis not present

## 2019-11-24 DIAGNOSIS — Z1211 Encounter for screening for malignant neoplasm of colon: Secondary | ICD-10-CM | POA: Diagnosis not present

## 2019-11-24 DIAGNOSIS — E119 Type 2 diabetes mellitus without complications: Secondary | ICD-10-CM | POA: Diagnosis not present

## 2019-11-24 DIAGNOSIS — Z6841 Body Mass Index (BMI) 40.0 and over, adult: Secondary | ICD-10-CM | POA: Diagnosis not present

## 2019-11-24 DIAGNOSIS — K621 Rectal polyp: Secondary | ICD-10-CM | POA: Diagnosis not present

## 2019-11-24 DIAGNOSIS — D649 Anemia, unspecified: Secondary | ICD-10-CM | POA: Diagnosis not present

## 2019-11-24 DIAGNOSIS — K5730 Diverticulosis of large intestine without perforation or abscess without bleeding: Secondary | ICD-10-CM | POA: Insufficient documentation

## 2019-11-24 DIAGNOSIS — I10 Essential (primary) hypertension: Secondary | ICD-10-CM | POA: Insufficient documentation

## 2019-11-24 DIAGNOSIS — D122 Benign neoplasm of ascending colon: Secondary | ICD-10-CM | POA: Insufficient documentation

## 2019-11-24 DIAGNOSIS — Z8049 Family history of malignant neoplasm of other genital organs: Secondary | ICD-10-CM | POA: Insufficient documentation

## 2019-11-24 HISTORY — PX: COLONOSCOPY WITH PROPOFOL: SHX5780

## 2019-11-24 LAB — GLUCOSE, CAPILLARY: Glucose-Capillary: 117 mg/dL — ABNORMAL HIGH (ref 70–99)

## 2019-11-24 SURGERY — COLONOSCOPY WITH PROPOFOL
Anesthesia: General

## 2019-11-24 MED ORDER — PROPOFOL 10 MG/ML IV BOLUS
INTRAVENOUS | Status: DC | PRN
  Administered 2019-11-24: 60 mg via INTRAVENOUS
  Administered 2019-11-24: 30 mg via INTRAVENOUS

## 2019-11-24 MED ORDER — LIDOCAINE HCL (CARDIAC) PF 100 MG/5ML IV SOSY
PREFILLED_SYRINGE | INTRAVENOUS | Status: DC | PRN
  Administered 2019-11-24: 50 mg via INTRAVENOUS

## 2019-11-24 MED ORDER — PROPOFOL 500 MG/50ML IV EMUL
INTRAVENOUS | Status: DC | PRN
  Administered 2019-11-24: 150 ug/kg/min via INTRAVENOUS

## 2019-11-24 MED ORDER — LIDOCAINE HCL (PF) 2 % IJ SOLN
INTRAMUSCULAR | Status: AC
  Filled 2019-11-24: qty 5

## 2019-11-24 MED ORDER — PROPOFOL 500 MG/50ML IV EMUL
INTRAVENOUS | Status: AC
  Filled 2019-11-24: qty 50

## 2019-11-24 MED ORDER — SODIUM CHLORIDE 0.9 % IV SOLN
INTRAVENOUS | Status: DC
  Administered 2019-11-24: 1000 mL via INTRAVENOUS

## 2019-11-24 NOTE — Transfer of Care (Signed)
Immediate Anesthesia Transfer of Care Note  Patient: ALBA PERILLO  Procedure(s) Performed: COLONOSCOPY WITH PROPOFOL (N/A )  Patient Location: Endoscopy Unit  Anesthesia Type:General  Level of Consciousness: drowsy  Airway & Oxygen Therapy: Patient Spontanous Breathing and Patient connected to face mask oxygen  Post-op Assessment: Report given to RN and Post -op Vital signs reviewed and stable  Post vital signs: Reviewed and stable  Last Vitals:  Vitals Value Taken Time  BP 93/63 11/24/19 1105  Temp    Pulse 64 11/24/19 1106  Resp 17 11/24/19 1106  SpO2 100 % 11/24/19 1106  Vitals shown include unvalidated device data.  Last Pain:  Vitals:   11/24/19 0929  TempSrc: Tympanic  PainSc: 8          Complications: No complications documented.

## 2019-11-24 NOTE — H&P (Signed)
Paula Antigua, MD [address] [place], [place], [place], [address] [address], [place], [place], [place], [address] Phone: [phone]  Fax: [phone]  Primary Care Physician:  Casilda Carls, MD   Pre-Procedure History & Physical: HPI:  Paula Flores is a 54 y.o. female is here for a colonoscopy.   Past Medical History:  Diagnosis Date  . Anemia   . Arthritis   . Bacterial infection due to H. pylori   . COVID-19 virus detected 6/13/20230  . Diabetes mellitus without complication ([place])   . Hypertension   . Mastitis 2014    Past Surgical History:  Procedure Laterality Date  . ABLATION ON ENDOMETRIOSIS  09/04/2011  . BOWEL RESECTION  1994  . BREAST BIOPSY  11/03/2014  . CESAREAN SECTION    . CHOLECYSTECTOMY  1994   had bowel resection  . FOOT SURGERY Right   . HYSTEROSCOPY WITH D & C N/A 10/13/2015   Procedure: DILATATION AND CURETTAGE /HYSTEROSCOPY;  Surgeon: Malachy Mood, MD;  Location: [facility] ORS;  Service: Gynecology;  Laterality: N/A;  . TONSILLECTOMY    . TONSILLECTOMY    . TUBAL LIGATION  1994    Prior to Admission medications   Medication Sig Start Date End Date Taking? Authorizing Provider  chlorthalidone (HYGROTON) 25 MG tablet Take 25 mg by mouth daily.   Yes [provider]  etodolac (LODINE) 400 MG tablet Take 400 mg by mouth 2 (two) times daily as needed. 07/11/19  Yes [provider]  glipiZIDE (GLUCOTROL XL) 5 MG 24 hr tablet Take 2 tablets (10 mg total) by mouth daily. 10/19/18  Yes Patrecia Pour, MD  losartan (COZAAR) 100 MG tablet Take 100 mg by mouth daily. 08/27/19  Yes [provider]  metFORMIN (GLUCOPHAGE) 500 MG tablet Take 1 tablet (500 mg total) by mouth 2 (two) times daily with a meal for 30 days. 10/19/18 11/24/19 Yes Patrecia Pour, MD  metoprolol tartrate (LOPRESSOR) 50 MG tablet Take 50 mg by mouth 2 (two) times daily. 01/20/19  Yes [provider]  atorvastatin (LIPITOR) 10 MG tablet Take 10  mg by mouth daily. 09/18/19   [provider]  terconazole (TERAZOL 7) 0.4 % vaginal cream Place 1 applicator vaginally at bedtime. 10/16/19   Malachy Mood, MD    Allergies as of 11/11/2019 - Review Complete 11/11/2019  Allergen Reaction Noted  . Penicillins Rash 07/23/2012    Family History  Problem Relation Age of Onset  . Cervical cancer Maternal Grandmother     Social History   Socioeconomic History  . Marital status: Married    Spouse name: Not on file  . Number of children: Not on file  . Years of education: Not on file  . Highest education level: Not on file  Occupational History  . Not on file  Tobacco Use  . Smoking status: Never Smoker  . Smokeless tobacco: Never Used  Vaping Use  . Vaping Use: Never used  Substance and Sexual Activity  . Alcohol use: Yes    Comment: OCCASIONALLY  . Drug use: No  . Sexual activity: Yes    Birth control/protection: None    Comment: Ablation  Other Topics Concern  . Not on file  Social History Narrative  . Not on file   Social Determinants of Health   Financial Resource Strain:   . Difficulty of Paying Living Expenses:   Food Insecurity:   . Worried About Charity fundraiser in the Last Year:   .  Ran Out of Food in the Last Year:   Transportation Needs:   . Film/video editor (Medical):   Marland Kitchen Lack of Transportation (Non-Medical):   Physical Activity:   . Days of Exercise per Week:   . Minutes of Exercise per Session:   Stress:   . Feeling of Stress :   Social Connections:   . Frequency of Communication with Friends and Family:   . Frequency of Social Gatherings with Friends and Family:   . Attends Religious Services:   . Active Member of Clubs or Organizations:   . Attends Archivist Meetings:   Marland Kitchen Marital Status:   Intimate Partner Violence:   . Fear of Current or Ex-Partner:   . Emotionally Abused:   Marland Kitchen Physically Abused:   . Sexually Abused:     Review of Systems: See HPI,  otherwise negative ROS  Physical Exam: BP (!) 151/97   Pulse 70   Temp (!) 96.3 F (35.7 C) (Tympanic)   Resp 18   Ht 5\' 6"  (1.676 m)   Wt (!) 142.9 kg   SpO2 99%   BMI 50.84 kg/m  General:   Alert,  pleasant and cooperative in NAD Head:  Normocephalic and atraumatic. Neck:  Supple; no masses or thyromegaly. Lungs:  Clear throughout to auscultation, normal respiratory effort.    Heart:  +S1, +S2, Regular rate and rhythm, No edema. Abdomen:  Soft, nontender and nondistended. Normal bowel sounds, without guarding, and without rebound.   Neurologic:  Alert and  oriented x4;  grossly normal neurologically.  Impression/Plan: Paula Flores is here for a colonoscopy to be performed for average risk screening.  Risks, benefits, limitations, and alternatives regarding  colonoscopy have been reviewed with the patient.  Questions have been answered.  All parties agreeable.   Virgel Manifold, MD  11/24/2019, 10:29 AM

## 2019-11-24 NOTE — Anesthesia Postprocedure Evaluation (Signed)
Anesthesia Post Note  Patient: Paula Flores  Procedure(s) Performed: COLONOSCOPY WITH PROPOFOL (N/A )  Patient location during evaluation: Endoscopy Anesthesia Type: General Level of consciousness: awake and alert and oriented Pain management: pain level controlled Vital Signs Assessment: post-procedure vital signs reviewed and stable Respiratory status: spontaneous breathing, nonlabored ventilation and respiratory function stable Cardiovascular status: blood pressure returned to baseline and stable Postop Assessment: no signs of nausea or vomiting Anesthetic complications: no   No complications documented.   Last Vitals:  Vitals:   11/24/19 1124 11/24/19 1134  BP: 118/67 130/83  Pulse:  (!) 57  Resp:  12  Temp:    SpO2:  100%    Last Pain:  Vitals:   11/24/19 1114  TempSrc:   PainSc: 0-No pain                 Grayling Schranz

## 2019-11-24 NOTE — Op Note (Signed)
[facility] Patient Name: Paula Flores Procedure Date: 11/24/2019 10:25 AM MRN: [id number] Account #: [id number] Date of Birth: 1965/12/20 Admit Type: Outpatient Age: 54 Room: [facility] ENDO ROOM 1 Gender: Female Note Status: Finalized Procedure:             Colonoscopy Indications:           Screening for colorectal malignant neoplasm Providers:             Juquan Reznick B. Bonna Gains MD, MD Referring MD:          Casilda Carls, MD (Referring MD) Medicines:             Monitored Anesthesia Care Complications:         No immediate complications. Procedure:             Pre-Anesthesia Assessment:                        - ASA Grade Assessment: II - A patient with mild                         systemic disease.                        - Prior to the procedure, a History and Physical was                         performed, and patient medications, allergies and                         sensitivities were reviewed. The patient's tolerance                         of previous anesthesia was reviewed.                        - The risks and benefits of the procedure and the                         sedation options and risks were discussed with the                         patient. All questions were answered and informed                         consent was obtained.                        - Patient identification and proposed procedure were                         verified prior to the procedure by the physician, the                         nurse, the anesthesiologist, the anesthetist and the                         technician. The procedure was verified in the                         procedure room.  After obtaining informed consent, the colonoscope was                         passed under direct vision. Throughout the procedure,                         the patient's blood pressure, pulse, and oxygen                         saturations were  monitored continuously. The                         Colonoscope was introduced through the anus and                         advanced to the the cecum, identified by appendiceal                         orifice and ileocecal valve. The colonoscopy was                         performed with ease. The patient tolerated the                         procedure well. The quality of the bowel preparation                         was good. Findings:      The perianal and digital rectal examinations were normal.      A 3 mm polyp was found in the ascending colon. The polyp was sessile.       The polyp was removed with a jumbo cold forceps. Resection and retrieval       were complete.      A 7 mm polyp was found in the rectum. The polyp was sessile. The polyp       was removed with a cold snare. Resection and retrieval were complete.      A few small and large-mouthed diverticula were found in the sigmoid       colon.      The exam was otherwise without abnormality.      The rectum, sigmoid colon, descending colon, transverse colon, ascending       colon and cecum appeared normal.      The retroflexed view of the distal rectum and anal verge was normal and       showed no anal or rectal abnormalities. Impression:            - One 3 mm polyp in the ascending colon, removed with                         a jumbo cold forceps. Resected and retrieved.                        - One 7 mm polyp in the rectum, removed with a cold                         snare. Resected and retrieved.                        -  Diverticulosis in the sigmoid colon.                        - The examination was otherwise normal.                        - The rectum, sigmoid colon, descending colon,                         transverse colon, ascending colon and cecum are normal.                        - The distal rectum and anal verge are normal on                         retroflexion view. Recommendation:        - Await pathology  results.                        - Discharge patient to home (with escort).                        - Advance diet as tolerated.                        - Continue present medications.                        - Repeat colonoscopy in 5 years.                        - The findings and recommendations were discussed with                         the patient.                        - The findings and recommendations were discussed with                         the patient's family.                        - Return to primary care physician as previously                         scheduled.                        - High fiber diet. Procedure Code(s):     --- Professional ---                        [phone], Colonoscopy, flexible; with removal of                         tumor(s), polyp(s), or other lesion(s) by snare                         technique                        66294, 59, Colonoscopy,  flexible; with biopsy, single                         or multiple Diagnosis Code(s):     --- Professional ---                        Z12.11, Encounter for screening for malignant neoplasm                         of colon                        K63.5, Polyp of colon                        K62.1, Rectal polyp CPT copyright 2019 American Medical Association. All rights reserved. The codes documented in this report are preliminary and upon coder review may  be revised to meet current compliance requirements.  [name], MD Margretta Sidle B. Bonna Gains MD, MD 11/24/2019 11:16:41 AM This report has been signed electronically. Number of Addenda: 0 Note Initiated On: 11/24/2019 10:25 AM Scope Withdrawal Time: 0 hours 20 minutes 31 seconds  Total Procedure Duration: 0 hours 23 minutes 32 seconds  Estimated Blood Loss:  Estimated blood loss: none.      [facility]

## 2019-11-24 NOTE — Anesthesia Preprocedure Evaluation (Signed)
Anesthesia Evaluation  Patient identified by MRN, date of birth, ID band Patient awake    Reviewed: Allergy & Precautions, H&P , NPO status , Patient's Chart, lab work & pertinent test results, reviewed documented beta blocker date and time   Airway Mallampati: II   Neck ROM: full    Dental  (+) Poor Dentition, Teeth Intact   Pulmonary neg pulmonary ROS,    Pulmonary exam normal        Cardiovascular Exercise Tolerance: Good hypertension, On Medications negative cardio ROS Normal cardiovascular exam Rhythm:regular Rate:Normal     Neuro/Psych negative neurological ROS  negative psych ROS   GI/Hepatic negative GI ROS, Neg liver ROS,   Endo/Other  diabetesMorbid obesity  Renal/GU negative Renal ROS  negative genitourinary   Musculoskeletal   Abdominal   Peds  Hematology  (+) Blood dyscrasia, anemia ,   Anesthesia Other Findings Past Medical History: No date: Anemia No date: Arthritis No date: Bacterial infection due to H. pylori 6/13/20230: COVID-19 virus detected No date: Diabetes mellitus without complication ([place]) No date: Hypertension 2014: Mastitis Past Surgical History: 09/04/2011: ABLATION ON ENDOMETRIOSIS 1994: BOWEL RESECTION 11/03/2014: BREAST BIOPSY No date: CESAREAN SECTION 1994: CHOLECYSTECTOMY     Comment:  had bowel resection No date: FOOT SURGERY; Right 10/13/2015: HYSTEROSCOPY WITH D & C; N/A     Comment:  Procedure: DILATATION AND CURETTAGE /HYSTEROSCOPY;                Surgeon: Malachy Mood, MD;  Location: [facility] ORS;                Service: Gynecology;  Laterality: N/A; No date: TONSILLECTOMY 1994: TUBAL LIGATION   Reproductive/Obstetrics negative OB ROS                             Anesthesia Physical Anesthesia Plan  ASA: III  Anesthesia Plan: General   Post-op Pain Management:    Induction:   PONV Risk Score and Plan:   Airway Management  Planned:   Additional Equipment:   Intra-op Plan:   Post-operative Plan:   Informed Consent: I have reviewed the patients History and Physical, chart, labs and discussed the procedure including the risks, benefits and alternatives for the proposed anesthesia with the patient or authorized representative who has indicated his/her understanding and acceptance.     Dental Advisory Given  Plan Discussed with: CRNA  Anesthesia Plan Comments:         Anesthesia Quick Evaluation

## 2019-11-25 ENCOUNTER — Encounter: Payer: Self-pay | Admitting: Gastroenterology

## 2019-11-25 LAB — SURGICAL PATHOLOGY

## 2019-11-26 ENCOUNTER — Encounter: Payer: Self-pay | Admitting: Gastroenterology

## 2019-12-16 ENCOUNTER — Other Ambulatory Visit: Payer: Self-pay | Admitting: Podiatry

## 2019-12-31 ENCOUNTER — Other Ambulatory Visit: Payer: Self-pay | Admitting: Surgery

## 2020-01-11 ENCOUNTER — Encounter
Admission: RE | Admit: 2020-01-11 | Discharge: 2020-01-11 | Disposition: A | Discharge status: Home / Self Care | Payer: BC Managed Care – PPO | Source: Ambulatory Visit | Attending: Surgery | Admitting: Surgery

## 2020-01-11 ENCOUNTER — Other Ambulatory Visit: Payer: Self-pay

## 2020-01-11 DIAGNOSIS — Z01818 Encounter for other preprocedural examination: Secondary | ICD-10-CM | POA: Insufficient documentation

## 2020-01-11 DIAGNOSIS — E119 Type 2 diabetes mellitus without complications: Secondary | ICD-10-CM | POA: Insufficient documentation

## 2020-01-11 DIAGNOSIS — I10 Essential (primary) hypertension: Secondary | ICD-10-CM | POA: Diagnosis not present

## 2020-01-11 LAB — BASIC METABOLIC PANEL
Anion gap: 11 (ref 5–15)
BUN: 21 mg/dL — ABNORMAL HIGH (ref 6–20)
CO2: 28 mmol/L (ref 22–32)
Calcium: 9.2 mg/dL (ref 8.9–10.3)
Chloride: 101 mmol/L (ref 98–111)
Creatinine, Ser: 0.68 mg/dL (ref 0.44–1.00)
GFR calc Af Amer: >60 mL/min (ref >60)
GFR calc non Af Amer: >60 mL/min (ref >60)
Glucose, Bld: 128 mg/dL — ABNORMAL HIGH (ref 70–99)
Potassium: 3.5 mmol/L (ref 3.5–5.1)
Sodium: 140 mmol/L (ref 135–145)

## 2020-01-11 NOTE — Progress Notes (Addendum)
  [facility] Perioperative Services: Pre-Admission/Anesthesia Testing   Date: 01/11/20  Name: Paula Flores MRN:   [id number]  Re: Consideration of perioperative therapeutic ABX change in patient with PCN allergy  Request sent to: Poggi, Marshall Cork, MD Notification mode: Routed and/or faxed via [facility]   Procedure: LEFT SHOULDER ARTHROSCOPY WITH DEBRIDEMENT, DECOMPRESSION, POSSIBLE ROTATOR CUFF REPAIR, AND POSSIBLE BICEPS TENODESIS (Left Shoulder) Date of procedure: 01/19/2020  Notes: 1. 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  2. Advising that PCN has caused her to experience low severity rash in the past.  3. Received cephalosporin (CEFTRIAXONE) on 10/15/2018 with no documented complications. 4. Screened as appropriate for cephalosporin use during medication reconciliation . No immediate angioedema, dysphagia, SOB, or anaphylaxis symptoms. . No severe rash involving mucous membranes or skin necrosis. . No hospital admissions related to side effects of PCN/cephalosporin use.  . No documented reaction to PCN or cephalosporin in the last 10 years.  Currently ordered preoperative prophylactic ABX: clindamycin.   Request: As an evidence based approach to reducing the rate of incidence for post-operative SSI and the development of MDROs, could an agent with narrower coverage for preoperative prophylaxis in this patient's upcoming surgical course be considered?  1. Specifically requesting change to cephalosporin (CEFAZOLIN).  2. Please have your staff communicate decision with me and I would be happy to change the orders in Epic as per your directives.   Things to consider: . Many patients report that they were "allergic" to PCN earlier in life, however this does not translate into a true lifelong allergy. Patients can lose sensitivity to specific IgE antibodies over time if PCN is avoided (Kleris & Lugar, 2019).  Marland Kitchen Up to 10% of the adult population and 15% of  hospitalized patients report an allergy to PCN, however clinical studies suggest that 90% of those reporting an allergy can tolerate PCN antibiotics (Kleris & Lugar, 2019).  . Cross-sensitivity between PCN and cephalosporins has been documented as being as high as 10%, however this estimation included data believed to have been collected in a setting where there was contamination. Newer data suggests that the prevalence of cross-sensitivity between PCN and cephalosporins is actually estimated to be closer to 1% (Hermanides et al., 2018).   . Patients labeled as PCN allergic, whether they are truly allergic or not, have been found to have inferior outcomes in terms of rates of serious infection, and these patients tend to have longer hospital stays (Ramona, 2019).  . Treatment related secondary infections, such as Clostridioides difficile, have been linked to the improper use of broad spectrum antibiotics in patients improperly labeled as PCN allergic (Kleris & Lugar, 2019).  Marland Kitchen Anaphylaxis from cephalosporins is rare and the evidence suggests that there is no increased risk of an anaphylactic type reaction when cephalosporins are used in a PCN allergic patient (Pichichero, 2006).   Citations Hermanides J, Lemkes BA, Prins JM, Hollmann MW, Terreehorst I. Presumed ?-Lactam Allergy and Cross-reactivity in the Operating Theater: A Practical Approach. Anesthesiology. 2018 Aug;129(2):335-342. doi: 10.1097/ALN.0000000000002252. PMID: [id number].  Kleris, [place] & Lugar, P. L. (2019). Things We Do For No Reason: Failing to Question a Penicillin Allergy History. Journal of hospital medicine, 14(10), [phone]. Advance online publication. https://www.wallace-middleton.info/  Pichichero, M. E. (2006). Cephalosporins can be prescribed safely for penicillin-allergic patients. Journal of family medicine, 55(2), 106-112. Accessed:  https://cdn.mdedge.com/files/s57fs-public/Document/September-2017/5502JFP_AppliedEvidence1.pdf  Honor Loh, MSN, APRN, FNP-C, CEN [facility]  Peri-operative Services Nurse Practitioner Phone: [phone] 01/11/20 1:16 PM

## 2020-01-11 NOTE — Patient Instructions (Addendum)
Your procedure is scheduled on: Tuesday, September 28 Report to Day Surgery on the 2nd floor of the [facility]. To find out your arrival time, please call [phone] between 1PM - 3PM on: Monday, September 27  REMEMBER: Instructions that are not followed completely may result in serious medical risk, up to and including death; or upon the discretion of your surgeon and anesthesiologist your surgery may need to be rescheduled.  Do not eat food after midnight the night before surgery.  No gum chewing, lozengers or hard candies.  You may however, drink water up to 2 hours before you are scheduled to arrive for your surgery. Do not drink anything within 2 hours of your scheduled arrival time.  In addition, your doctor has ordered for you to drink the provided  Gatorade G2 Drinking this carbohydrate drink up to two hours before surgery helps to reduce insulin resistance and improve patient outcomes. Please complete drinking 2 hours prior to scheduled arrival time.  TAKE THESE MEDICATIONS THE MORNING OF SURGERY WITH A SIP OF WATER:  1.  Metoprolol  Stop Metformin 2 days prior to surgery. Last day to take is Saturday, Sept. 25; resume after surgery.  One week prior to surgery: Stop Anti-inflammatories (NSAIDS) such as Advil, Aleve, Ibuprofen, Motrin, Naproxen, Naprosyn and Aspirin based products such as Excedrin, Goodys Powder, BC Powder. Stop ANY OVER THE COUNTER supplements until after surgery. (You may continue taking Tylenol, Vitamin D.)  No Alcohol for 24 hours before or after surgery.  On the morning of surgery brush your teeth with toothpaste and water, you may rinse your mouth with mouthwash if you wish. Do not swallow any toothpaste or mouthwash.  Do not wear jewelry, make-up, hairpins, clips or nail polish.  Do not wear lotions, powders, or perfumes.   Do not shave 48 hours prior to surgery.   Do not bring valuables to the hospital. [facility] is not responsible for  any missing/lost belongings or valuables.   Use CHG Soap as directed on instruction sheet.  Notify your doctor if there is any change in your medical condition (cold, fever, infection).  Wear comfortable clothing (specific to your surgery type) to the hospital.  Plan for stool softeners for home use; pain medications have a tendency to cause constipation. You can also help prevent constipation by eating foods high in fiber such as fruits and vegetables and drinking plenty of fluids as your diet allows.  After surgery, you can help prevent lung complications by doing breathing exercises.  Take deep breaths and cough every 1-2 hours. Your doctor may order a device called an Incentive Spirometer to help you take deep breaths.  If you are being discharged the day of surgery, you will not be allowed to drive home. You will need a responsible adult (18 years or older) to drive you home and stay with you that night.   If you are taking public transportation, you will need to have a responsible adult (18 years or older) with you. Please confirm with your physician that it is acceptable to use public transportation.   Please call the [facility] at [phone] if you have any questions about these instructions.  Visitation Policy:  Patients undergoing a surgery or procedure may have one family member or support person with them as long as that person is not COVID-19 positive or experiencing its symptoms.  That person may remain in the waiting area during the procedure.  Masking is required regardless of vaccination  status.  Systemwide, no visitors 17 or younger.

## 2020-01-15 ENCOUNTER — Other Ambulatory Visit: Payer: Self-pay

## 2020-01-15 ENCOUNTER — Other Ambulatory Visit
Admission: RE | Admit: 2020-01-15 | Discharge: 2020-01-15 | Disposition: A | Discharge status: Home / Self Care | Payer: BC Managed Care – PPO | Source: Ambulatory Visit | Attending: Surgery | Admitting: Surgery

## 2020-01-15 DIAGNOSIS — Z20822 Contact with and (suspected) exposure to covid-19: Secondary | ICD-10-CM | POA: Insufficient documentation

## 2020-01-15 DIAGNOSIS — Z01812 Encounter for preprocedural laboratory examination: Secondary | ICD-10-CM | POA: Insufficient documentation

## 2020-01-15 LAB — SARS CORONAVIRUS 2 (TAT 6-24 HRS): SARS Coronavirus 2: NEGATIVE

## 2020-01-19 ENCOUNTER — Encounter: Payer: Self-pay | Admitting: Surgery

## 2020-01-19 ENCOUNTER — Encounter
Admission: RE | Disposition: A | Discharge status: Home / Self Care | Payer: Self-pay | Source: Home / Self Care | Attending: Surgery

## 2020-01-19 ENCOUNTER — Ambulatory Visit: Payer: BC Managed Care – PPO | Admitting: Urgent Care

## 2020-01-19 ENCOUNTER — Ambulatory Visit: Payer: BC Managed Care – PPO

## 2020-01-19 ENCOUNTER — Ambulatory Visit: Payer: BC Managed Care – PPO | Admitting: Anesthesiology

## 2020-01-19 ENCOUNTER — Other Ambulatory Visit: Payer: Self-pay

## 2020-01-19 ENCOUNTER — Ambulatory Visit
Admission: RE | Admit: 2020-01-19 | Discharge: 2020-01-19 | Disposition: A | Discharge status: Home / Self Care | Payer: BC Managed Care – PPO | Attending: Surgery | Admitting: Surgery

## 2020-01-19 DIAGNOSIS — Z880 Allergy status to penicillin: Secondary | ICD-10-CM | POA: Insufficient documentation

## 2020-01-19 DIAGNOSIS — Y92481 Parking lot as the place of occurrence of the external cause: Secondary | ICD-10-CM | POA: Insufficient documentation

## 2020-01-19 DIAGNOSIS — S46012A Strain of muscle(s) and tendon(s) of the rotator cuff of left shoulder, initial encounter: Secondary | ICD-10-CM | POA: Insufficient documentation

## 2020-01-19 DIAGNOSIS — E119 Type 2 diabetes mellitus without complications: Secondary | ICD-10-CM | POA: Insufficient documentation

## 2020-01-19 DIAGNOSIS — Z79899 Other long term (current) drug therapy: Secondary | ICD-10-CM | POA: Insufficient documentation

## 2020-01-19 DIAGNOSIS — M7582 Other shoulder lesions, left shoulder: Secondary | ICD-10-CM | POA: Diagnosis not present

## 2020-01-19 DIAGNOSIS — M19012 Primary osteoarthritis, left shoulder: Secondary | ICD-10-CM | POA: Diagnosis not present

## 2020-01-19 DIAGNOSIS — Z6841 Body Mass Index (BMI) 40.0 and over, adult: Secondary | ICD-10-CM | POA: Insufficient documentation

## 2020-01-19 DIAGNOSIS — W010XXA Fall on same level from slipping, tripping and stumbling without subsequent striking against object, initial encounter: Secondary | ICD-10-CM | POA: Diagnosis not present

## 2020-01-19 DIAGNOSIS — Z7984 Long term (current) use of oral hypoglycemic drugs: Secondary | ICD-10-CM | POA: Insufficient documentation

## 2020-01-19 DIAGNOSIS — Z8249 Family history of ischemic heart disease and other diseases of the circulatory system: Secondary | ICD-10-CM | POA: Insufficient documentation

## 2020-01-19 DIAGNOSIS — I10 Essential (primary) hypertension: Secondary | ICD-10-CM | POA: Insufficient documentation

## 2020-01-19 DIAGNOSIS — M75112 Incomplete rotator cuff tear or rupture of left shoulder, not specified as traumatic: Secondary | ICD-10-CM | POA: Diagnosis present

## 2020-01-19 DIAGNOSIS — Z9049 Acquired absence of other specified parts of digestive tract: Secondary | ICD-10-CM | POA: Diagnosis not present

## 2020-01-19 DIAGNOSIS — Z419 Encounter for procedure for purposes other than remedying health state, unspecified: Secondary | ICD-10-CM

## 2020-01-19 DIAGNOSIS — M7542 Impingement syndrome of left shoulder: Secondary | ICD-10-CM | POA: Diagnosis not present

## 2020-01-19 HISTORY — PX: SHOULDER ARTHROSCOPY WITH OPEN ROTATOR CUFF REPAIR: SHX6092

## 2020-01-19 LAB — POCT PREGNANCY, URINE: Preg Test, Ur: NEGATIVE

## 2020-01-19 LAB — GLUCOSE, CAPILLARY
Glucose-Capillary: 125 mg/dL — ABNORMAL HIGH (ref 70–99)
Glucose-Capillary: 160 mg/dL — ABNORMAL HIGH (ref 70–99)

## 2020-01-19 SURGERY — ARTHROSCOPY, SHOULDER WITH REPAIR, ROTATOR CUFF, OPEN
Anesthesia: General | Site: Shoulder | Laterality: Left

## 2020-01-19 MED ORDER — ROCURONIUM BROMIDE 100 MG/10ML IV SOLN
INTRAVENOUS | Status: DC | PRN
  Administered 2020-01-19: 10 mg via INTRAVENOUS

## 2020-01-19 MED ORDER — FENTANYL CITRATE (PF) 100 MCG/2ML IJ SOLN
INTRAMUSCULAR | Status: AC
  Administered 2020-01-19: 50 ug via INTRAVENOUS
  Filled 2020-01-19: qty 2

## 2020-01-19 MED ORDER — ONDANSETRON HCL 4 MG/2ML IJ SOLN: 4.0000 mg | Freq: Once | INTRAMUSCULAR | Status: DC | PRN

## 2020-01-19 MED ORDER — PROPOFOL 10 MG/ML IV BOLUS
INTRAVENOUS | Status: AC
  Filled 2020-01-19: qty 20

## 2020-01-19 MED ORDER — SEVOFLURANE IN SOLN
RESPIRATORY_TRACT | Status: AC
  Filled 2020-01-19: qty 250

## 2020-01-19 MED ORDER — ACETAMINOPHEN 10 MG/ML IV SOLN
INTRAVENOUS | Status: AC
  Filled 2020-01-19: qty 100

## 2020-01-19 MED ORDER — LACTATED RINGERS IR SOLN
Status: DC | PRN
  Administered 2020-01-19: 3000 mL

## 2020-01-19 MED ORDER — SODIUM CHLORIDE 0.9 % IV SOLN
INTRAVENOUS | Status: DC | PRN
  Administered 2020-01-19: 20 ug/min via INTRAVENOUS

## 2020-01-19 MED ORDER — FENTANYL CITRATE (PF) 100 MCG/2ML IJ SOLN: 25.0000 ug | INTRAMUSCULAR | Status: DC | PRN

## 2020-01-19 MED ORDER — EPINEPHRINE PF 1 MG/ML IJ SOLN
INTRAMUSCULAR | Status: AC
  Filled 2020-01-19: qty 2

## 2020-01-19 MED ORDER — CLINDAMYCIN PHOSPHATE 900 MG/50ML IV SOLN
900.0000 mg | INTRAVENOUS | Status: AC
  Administered 2020-01-19: 600 mg via INTRAVENOUS

## 2020-01-19 MED ORDER — CHLORHEXIDINE GLUCONATE 0.12 % MT SOLN
OROMUCOSAL | Status: AC
  Administered 2020-01-19: 15 mL via OROMUCOSAL
  Filled 2020-01-19: qty 15

## 2020-01-19 MED ORDER — BUPIVACAINE HCL (PF) 0.5 % IJ SOLN
INTRAMUSCULAR | Status: DC | PRN
  Administered 2020-01-19: 10 mL via PERINEURAL

## 2020-01-19 MED ORDER — BUPIVACAINE HCL (PF) 0.5 % IJ SOLN
INTRAMUSCULAR | Status: AC
  Filled 2020-01-19: qty 10

## 2020-01-19 MED ORDER — CHLORHEXIDINE GLUCONATE 0.12 % MT SOLN: 15.0000 mL | Freq: Once | OROMUCOSAL | Status: AC

## 2020-01-19 MED ORDER — FENTANYL CITRATE (PF) 100 MCG/2ML IJ SOLN
INTRAMUSCULAR | Status: AC
  Filled 2020-01-19: qty 2

## 2020-01-19 MED ORDER — MIDAZOLAM HCL 2 MG/2ML IJ SOLN: 1.0000 mg | Freq: Once | INTRAMUSCULAR | Status: AC

## 2020-01-19 MED ORDER — PROPOFOL 10 MG/ML IV BOLUS
INTRAVENOUS | Status: DC | PRN
  Administered 2020-01-19: 200 mg via INTRAVENOUS

## 2020-01-19 MED ORDER — ORAL CARE MOUTH RINSE: 15.0000 mL | Freq: Once | OROMUCOSAL | Status: AC

## 2020-01-19 MED ORDER — FAMOTIDINE 20 MG PO TABS
ORAL_TABLET | ORAL | Status: AC
  Administered 2020-01-19: 20 mg via ORAL
  Filled 2020-01-19: qty 1

## 2020-01-19 MED ORDER — MIDAZOLAM HCL 2 MG/2ML IJ SOLN
INTRAMUSCULAR | Status: AC
  Administered 2020-01-19: 1 mg via INTRAVENOUS
  Filled 2020-01-19: qty 2

## 2020-01-19 MED ORDER — BUPIVACAINE HCL (PF) 0.5 % IJ SOLN
INTRAMUSCULAR | Status: AC
  Filled 2020-01-19: qty 30

## 2020-01-19 MED ORDER — MIDAZOLAM HCL 2 MG/2ML IJ SOLN
INTRAMUSCULAR | Status: AC
  Filled 2020-01-19: qty 2

## 2020-01-19 MED ORDER — BUPIVACAINE-EPINEPHRINE 0.5% -1:200000 IJ SOLN
INTRAMUSCULAR | Status: DC | PRN
  Administered 2020-01-19: 30 mL

## 2020-01-19 MED ORDER — CLINDAMYCIN PHOSPHATE 900 MG/50ML IV SOLN
INTRAVENOUS | Status: AC
  Filled 2020-01-19: qty 50

## 2020-01-19 MED ORDER — BUPIVACAINE LIPOSOME 1.3 % IJ SUSP
INTRAMUSCULAR | Status: DC | PRN
  Administered 2020-01-19: 20 mL via PERINEURAL

## 2020-01-19 MED ORDER — FENTANYL CITRATE (PF) 100 MCG/2ML IJ SOLN: 50.0000 ug | Freq: Once | INTRAMUSCULAR | Status: AC

## 2020-01-19 MED ORDER — BUPIVACAINE LIPOSOME 1.3 % IJ SUSP: INTRAMUSCULAR | Status: DC | PRN

## 2020-01-19 MED ORDER — FAMOTIDINE 20 MG PO TABS: 20.0000 mg | ORAL_TABLET | Freq: Once | ORAL | Status: AC

## 2020-01-19 MED ORDER — SUCCINYLCHOLINE CHLORIDE 20 MG/ML IJ SOLN
INTRAMUSCULAR | Status: DC | PRN
  Administered 2020-01-19: 120 mg via INTRAVENOUS

## 2020-01-19 MED ORDER — LIDOCAINE HCL (PF) 1 % IJ SOLN
INTRAMUSCULAR | Status: AC
  Filled 2020-01-19: qty 5

## 2020-01-19 MED ORDER — EPINEPHRINE PF 1 MG/ML IJ SOLN
INTRAMUSCULAR | Status: AC
  Filled 2020-01-19: qty 1

## 2020-01-19 MED ORDER — PHENYLEPHRINE HCL (PRESSORS) 10 MG/ML IV SOLN
INTRAVENOUS | Status: DC | PRN
  Administered 2020-01-19 (×2): 100 ug via INTRAVENOUS

## 2020-01-19 MED ORDER — ACETAMINOPHEN 10 MG/ML IV SOLN
INTRAVENOUS | Status: DC | PRN
  Administered 2020-01-19: 1000 mg via INTRAVENOUS

## 2020-01-19 MED ORDER — EPHEDRINE SULFATE 50 MG/ML IJ SOLN
INTRAMUSCULAR | Status: DC | PRN
  Administered 2020-01-19: 10 mg via INTRAVENOUS
  Administered 2020-01-19: 5 mg via INTRAVENOUS
  Administered 2020-01-19 (×2): 10 mg via INTRAVENOUS

## 2020-01-19 MED ORDER — LIDOCAINE HCL (PF) 1 % IJ SOLN
INTRAMUSCULAR | Status: DC | PRN
  Administered 2020-01-19: .8 mL

## 2020-01-19 MED ORDER — LACTATED RINGERS IV SOLN
INTRAVENOUS | Status: DC | PRN
  Administered 2020-01-19: 3000 mL

## 2020-01-19 MED ORDER — DEXMEDETOMIDINE (PRECEDEX) IN NS 20 MCG/5ML (4 MCG/ML) IV SYRINGE
PREFILLED_SYRINGE | INTRAVENOUS | Status: AC
  Filled 2020-01-19: qty 5

## 2020-01-19 MED ORDER — BUPIVACAINE LIPOSOME 1.3 % IJ SUSP
INTRAMUSCULAR | Status: AC
  Filled 2020-01-19: qty 20

## 2020-01-19 MED ORDER — FENTANYL CITRATE (PF) 100 MCG/2ML IJ SOLN
50.0000 ug | Freq: Once | INTRAMUSCULAR | Status: AC
  Administered 2020-01-19: 50 ug via INTRAVENOUS

## 2020-01-19 MED ORDER — MIDAZOLAM HCL 2 MG/2ML IJ SOLN
1.0000 mg | Freq: Once | INTRAMUSCULAR | Status: AC
  Administered 2020-01-19: 1 mg via INTRAVENOUS

## 2020-01-19 MED ORDER — OXYCODONE HCL 5 MG PO TABS: 5.0000 mg | ORAL_TABLET | ORAL | 0 refills | Status: DC | PRN

## 2020-01-19 MED ORDER — LIDOCAINE HCL (CARDIAC) PF 100 MG/5ML IV SOSY
PREFILLED_SYRINGE | INTRAVENOUS | Status: DC | PRN
  Administered 2020-01-19: 100 mg via INTRAVENOUS

## 2020-01-19 MED ORDER — ONDANSETRON HCL 4 MG/2ML IJ SOLN
INTRAMUSCULAR | Status: DC | PRN
  Administered 2020-01-19: 4 mg via INTRAVENOUS

## 2020-01-19 MED ORDER — SODIUM CHLORIDE 0.9 % IV SOLN: INTRAVENOUS | Status: DC

## 2020-01-19 MED ORDER — DEXAMETHASONE SODIUM PHOSPHATE 10 MG/ML IJ SOLN
INTRAMUSCULAR | Status: DC | PRN
  Administered 2020-01-19: 5 mg via INTRAVENOUS

## 2020-01-19 SURGICAL SUPPLY — 56 items
ANCH SUT 2 2.9 2 LD TPR NDL (Anchor) ×1 IMPLANT
ANCH SUT 2 2/0 ABS BRD STRL (Anchor) ×1 IMPLANT
ANCH SUT BN ASCP DLV (Anchor) ×1 IMPLANT
ANCH SUT RGNRT REGENETEN (Staple) ×1 IMPLANT
ANCHOR BONE REGENETEN (Anchor) ×2 IMPLANT
ANCHOR JUGGERKNOT WTAP NDL 2.9 (Anchor) ×2 IMPLANT
ANCHOR SUT W/ ORTHOCORD (Anchor) ×2 IMPLANT
ANCHOR TENDON REGENETEN (Staple) ×2 IMPLANT
APL PRP STRL LF DISP 70% ISPRP (MISCELLANEOUS) ×1
BIT DRILL JUGRKNT W/NDL BIT2.9 (DRILL) ×1 IMPLANT
BLADE FULL RADIUS 3.5 (BLADE) ×2 IMPLANT
BUR ACROMIONIZER 4.0 (BURR) ×2 IMPLANT
CANNULA SHAVER 8MMX76MM (CANNULA) ×2 IMPLANT
CHLORAPREP W/TINT 26 (MISCELLANEOUS) ×2 IMPLANT
COVER MAYO STAND REUSABLE (DRAPES) ×2 IMPLANT
COVER WAND RF STERILE (DRAPES) ×2 IMPLANT
DRAPE IMP U-DRAPE 54X76 (DRAPES) ×4 IMPLANT
DRILL JUGGERKNOT W/NDL BIT 2.9 (DRILL) ×2
ELECT CAUTERY BLADE TIP 2.5 (TIP) ×2
ELECT REM PT RETURN 9FT ADLT (ELECTROSURGICAL) ×2
ELECTRODE CAUTERY BLDE TIP 2.5 (TIP) ×1 IMPLANT
ELECTRODE REM PT RTRN 9FT ADLT (ELECTROSURGICAL) ×1 IMPLANT
GAUZE SPONGE 4X4 12PLY STRL (GAUZE/BANDAGES/DRESSINGS) ×2 IMPLANT
GAUZE XEROFORM 1X8 LF (GAUZE/BANDAGES/DRESSINGS) ×2 IMPLANT
GLOVE BIO SURGEON STRL SZ7.5 (GLOVE) ×4 IMPLANT
GLOVE BIO SURGEON STRL SZ8 (GLOVE) ×4 IMPLANT
GLOVE BIOGEL PI IND STRL 8 (GLOVE) ×1 IMPLANT
GLOVE BIOGEL PI INDICATOR 8 (GLOVE) ×1
GLOVE INDICATOR 8.0 STRL GRN (GLOVE) ×2 IMPLANT
GOWN STRL REUS W/ TWL LRG LVL3 (GOWN DISPOSABLE) ×1 IMPLANT
GOWN STRL REUS W/ TWL XL LVL3 (GOWN DISPOSABLE) ×1 IMPLANT
GOWN STRL REUS W/TWL LRG LVL3 (GOWN DISPOSABLE) ×2
GOWN STRL REUS W/TWL XL LVL3 (GOWN DISPOSABLE) ×2
GRASPER SUT 15 45D LOW PRO (SUTURE) ×2 IMPLANT
IMPL REGENETEN MEDIUM (Shoulder) ×1 IMPLANT
IMPLANT REGENETEN MEDIUM (Shoulder) ×2 IMPLANT
IV LACTATED RINGER IRRG 3000ML (IV SOLUTION) ×4
IV LR IRRIG 3000ML ARTHROMATIC (IV SOLUTION) ×2 IMPLANT
KIT CANNULA 8X76-LX IN CANNULA (CANNULA) ×4 IMPLANT
MANIFOLD NEPTUNE II (INSTRUMENTS) ×2 IMPLANT
MASK FACE SPIDER DISP (MASK) ×2 IMPLANT
MAT ABSORB  FLUID 56X50 GRAY (MISCELLANEOUS) ×1
MAT ABSORB FLUID 56X50 GRAY (MISCELLANEOUS) ×1 IMPLANT
PACK SHDR ARTHRO (MISCELLANEOUS) ×2 IMPLANT
PASSER SUT FIRSTPASS SELF (INSTRUMENTS) IMPLANT
SLING ARM LRG DEEP (SOFTGOODS) IMPLANT
STAPLER SKIN PROX 35W (STAPLE) ×2 IMPLANT
STRAP SAFETY 5IN WIDE (MISCELLANEOUS) ×2 IMPLANT
SUT ETHIBOND 0 MO6 C/R (SUTURE) ×2 IMPLANT
SUT ULTRABRAID 2 COBRAID 38 (SUTURE) IMPLANT
SUT VIC AB 2-0 CT1 27 (SUTURE) ×4
SUT VIC AB 2-0 CT1 TAPERPNT 27 (SUTURE) ×2 IMPLANT
TAPE MICROFOAM 4IN (TAPE) ×2 IMPLANT
TUBING ARTHRO INFLOW-ONLY STRL (TUBING) ×2 IMPLANT
Ultra sling XL (sling shot) ×2 IMPLANT
WAND WEREWOLF FLOW 90D (MISCELLANEOUS) ×2 IMPLANT

## 2020-01-19 NOTE — Transfer of Care (Signed)
Immediate Anesthesia Transfer of Care Note  Patient: Paula Flores  Procedure(s) Performed: LEFT SHOULDER ARTHROSCOPY WITH DEBRIDEMENT, DECOMPRESSION, POSSIBLE ROTATOR CUFF REPAIR, AND POSSIBLE BICEPS TENODESIS (Left Shoulder)  Patient Location: PACU  Anesthesia Type:General  Level of Consciousness: drowsy, patient cooperative and responds to stimulation  Airway & Oxygen Therapy: Patient Spontanous Breathing and Patient connected to nasal cannula oxygen  Post-op Assessment: Report given to RN and Post -op Vital signs reviewed and stable  Post vital signs: Reviewed and stable  Last Vitals:  Vitals Value Taken Time  BP 172/93 01/19/20 1030  Temp 36.2 C 01/19/20 1030  Pulse 72 01/19/20 1035  Resp 17 01/19/20 1035  SpO2 98 % 01/19/20 1035  Vitals shown include unvalidated device data.  Last Pain:  Vitals:   01/19/20 1030  TempSrc:   PainSc: Asleep         Complications: No complications documented.

## 2020-01-19 NOTE — Op Note (Signed)
01/19/2020  10:17 AM  Patient:   Paula Flores  Pre-Op Diagnosis:   Impingement/tendinopathy with partial-thickness rotator cuff tear, left shoulder.  Post-Op Diagnosis:   Impingement/tendinopathy with partial-thickness tears involving the supraspinatus and subscapularis tendons, labral fraying, and biceps tendinopathy, left shoulder.  Procedure:   Extensive arthroscopic debridement, arthroscopic repair of subscapularis tendon tear, arthroscopic subacromial decompression, mini-open rotator cuff repair, and biceps tenolysis, left shoulder.  Anesthesia:   General endotracheal with interscalene block using Exparel placed preoperatively by the anesthesiologist.  Surgeon:   Pascal Lux, MD  Assistant:   Cameron Proud, PA-C  Findings:   As above.  There was a partial-thickness tear involving the superior insertional fibers of the subscapularis tendon, as well as a near full-thickness articular sided tear involving the anterior and mid-insertional fibers of the supraspinatus tendon.  The remainder the rotator cuff was in excellent condition.  There was some labral fraying anteriorly and superiorly without frank detachment from the glenoid rim.  The biceps tendon demonstrated areas of "lip sticking" without partial or full-thickness tears.  The articular surface of the glenoid and humerus both were in excellent condition.  Complications:   None  Fluids:   600 cc  Estimated blood loss:   5 cc  Tourniquet time:   None  Drains:   None  Closure:   Staples      Brief clinical note:   The patient is a 54 year old female with a history of left shoulder pain which developed following a slip and fall injury at work several years ago. The patient's symptoms have progressed despite medications, activity modification, etc. The patient's history and examination are consistent with impingement/tendinopathy with a rotator cuff tear. These findings were confirmed by MRI scan. The patient presents at  this time for definitive management of these shoulder symptoms.  Procedure:   The patient underwent placement of an interscalene block using Exparel by the anesthesiologist in the preoperative holding area before being brought into the operating room and lain in the supine position. The patient then underwent general endotracheal intubation and anesthesia before being repositioned in the beach chair position using the beach chair positioner. The left shoulder and upper extremity were prepped with ChloraPrep solution before being draped sterilely. Preoperative antibiotics were administered. A timeout was performed to confirm the proper surgical site before the expected portal sites and incision site were injected with 0.5% Sensorcaine with epinephrine.   A posterior portal was created and the glenohumeral joint thoroughly inspected with the findings as described above. An anterior portal was created using an outside-in technique. The labrum and rotator cuff were further probed, again confirming the above-noted findings. The areas of labral fraying were debrided back to stable margins using the full-radius resector, as were the the torn portions of the subscapularis and supraspinatus tendons. The exposed footprint on the greater and lesser tuberosities also were debrided to provide a fresh bony surface for reattachment. The ArthroCare wand was inserted and used to release the biceps tendon from its labral anchor. It also was used to obtain hemostasis as well as to "anneal" the labrum superiorly and anteriorly. The instruments were removed from the joint after suctioning the excess fluid.  The camera was repositioned through the posterior portal into the subacromial space. A separate lateral portal was created using an outside-in technique. The 3.5 mm full-radius resector was introduced and used to perform a subtotal bursectomy. The ArthroCare wand was then inserted and used to remove the periosteal tissue off  the undersurface  of the anterior third of the acromion as well as to recess the coracoacromial ligament from its attachment along the anterior and lateral margins of the acromion. The 4.0 mm acromionizing bur was introduced and used to complete the decompression by removing the undersurface of the anterior third of the acromion. The full radius resector was reintroduced to remove any residual bony debris before the ArthroCare wand was reintroduced to obtain hemostasis. The instruments were then removed from the subacromial space after suctioning the excess fluid.  An approximately 4-5 cm incision was made over the anterolateral aspect of the shoulder beginning at the anterolateral corner of the acromion and extending distally in line with the bicipital groove. This incision was carried down through the subcutaneous tissues to expose the deltoid fascia. The raphae between the anterior and middle thirds was identified and this plane developed to provide access into the subacromial space. Additional bursal tissues were debrided sharply using Metzenbaum scissors. The rotator cuff tear was readily identified. The margins were debrided sharply with a #15 blade and the exposed greater tuberosity roughened with a rongeur. The tear was repaired using a single Biomet 2.9 mm JuggerKnot anchor. An additional #0 Ethibond interrupted suture was placed in a side-to-side fashion to help reapproximate the rotator cuff margin. Finally, a medium [employer]hew regenerative and patch was applied over the repair site and secured using the appropriate bone and soft tissue staples to optimize healing. An apparent watertight closure was obtained.  The wound was copiously irrigated with sterile saline solution before the deltoid raphae was reapproximated using 2-0 Vicryl interrupted sutures. The subcutaneous tissues were closed in two layers using 2-0 Vicryl interrupted sutures before the skin was closed using staples. The portal sites  also were closed using staples. A sterile bulky dressing was applied to the shoulder before the arm was placed into a shoulder immobilizer. The patient was then awakened, extubated, and returned to the recovery room in satisfactory condition after tolerating the procedure well.

## 2020-01-19 NOTE — Anesthesia Procedure Notes (Deleted)
Date/Time: 01/19/2020 8:34 AM Performed by: Allean Found, CRNA

## 2020-01-19 NOTE — Anesthesia Postprocedure Evaluation (Signed)
Anesthesia Post Note  Patient: Paula Flores  Procedure(s) Performed: LEFT SHOULDER ARTHROSCOPY WITH DEBRIDEMENT, DECOMPRESSION, POSSIBLE ROTATOR CUFF REPAIR, AND POSSIBLE BICEPS TENODESIS (Left Shoulder)  Patient location during evaluation: PACU Anesthesia Type: General Level of consciousness: awake and alert Pain management: pain level controlled Vital Signs Assessment: post-procedure vital signs reviewed and stable Respiratory status: spontaneous breathing, nonlabored ventilation, respiratory function stable and patient connected to nasal cannula oxygen Cardiovascular status: blood pressure returned to baseline and stable Postop Assessment: no apparent nausea or vomiting Anesthetic complications: no   No complications documented.   Last Vitals:  Vitals:   01/19/20 1114 01/19/20 1135  BP: 119/78 121/81  Pulse: 74 79  Resp: 14   Temp: 36.6 C 36.6 C  SpO2: 98% 98%    Last Pain:  Vitals:   01/19/20 1135  TempSrc: Temporal  PainSc: 0-No pain                 Arita Miss

## 2020-01-19 NOTE — Discharge Instructions (Addendum)
Orthopedic discharge instructions: Keep dressing dry and intact.  May shower after dressing changed on post-op day #4 (Saturday).  Cover staples/sutures with Band-Aids after drying off. Apply ice frequently to shoulder. Take Lodine 400 mg BID OR ibuprofen 600-800 mg TID with meals for 7-10 days, then as necessary.  BID-twice daily (every 12 hrs) Take oxycodone as prescribed when needed.                                                                                            TID-three times daily (every 8 hrs) May supplement with ES Tylenol if necessary. Keep shoulder immobilizer on at all times except may remove for bathing purposes. Follow-up in 10-14 days or as scheduled.     Interscalene Nerve Block with Exparel  1.  For your surgery you have received an Interscalene Nerve Block with Exparel. 2. Nerve Blocks affect many types of nerves, including nerves that control movement, pain and normal sensation.  You may experience feelings such as numbness, tingling, heaviness, weakness or the inability to move your arm or the feeling or sensation that your arm has "fallen asleep". 3. A nerve block with Exparel can last up to 5 days.  Usually the weakness wears off first.  The tingling and heaviness usually wear off next.  Finally you may start to notice pain.  Keep in mind that this may occur in any order.  Once a nerve block starts to wear off it is usually completely gone within 60 minutes. 4. ISNB may cause mild shortness of breath, a hoarse voice, blurry vision, unequal pupils, or drooping of the face on the same side as the nerve block.  These symptoms will usually resolve with the numbness.  Very rarely the procedure itself can cause mild seizures. 5. If needed, your surgeon will give you a prescription for pain medication.  It will take about 60 minutes for the oral pain medication to become fully effective.  So, it is recommended that you start taking this medication before the nerve block  first begins to wear off, or when you first begin to feel discomfort. 6. Take your pain medication only as prescribed.  Pain medication can cause sedation and decrease your breathing if you take more than you need for the level of pain that you have. 7. Nausea is a common side effect of many pain medications.  You may want to eat something before taking your pain medicine to prevent nausea. 8. After an Interscalene nerve block, you cannot feel pain, pressure or extremes in temperature in the effected arm.  Because your arm is numb it is at an increased risk for injury.  To decrease the possibility of injury, please practice the following:  a. While you are awake change the position of your arm frequently to prevent too much pressure on any one area for prolonged periods of time. b.  If you have a cast or tight dressing, check the color or your fingers every couple of hours.  Call your surgeon with the appearance of any discoloration (white or blue). c. If you are given a sling to wear before you go  home, please wear it  at all times until the block has completely worn off.  Do not get up at night without your sling. d. Please contact [facility] or your surgeon if you do not begin to regain sensation after 7 days from the surgery.  Anesthesia may be contacted by calling the Same Day Surgery Department, Mon. through Fri., 6 am to 4 pm at [phone].   e. If you experience any other problems or concerns, please contact your surgeon's office. f. If you experience severe or prolonged shortness of breath go to the nearest emergency department.   AMBULATORY SURGERY  DISCHARGE INSTRUCTIONS   1) The drugs that you were given will stay in your system until tomorrow so for the next 24 hours you should not:  A) Drive an automobile B) Make any legal decisions C) Drink any alcoholic beverage   2) You may resume regular meals tomorrow.  Today it is better to start with liquids and gradually work up  to solid foods.  You may eat anything you prefer, but it is better to start with liquids, then soup and crackers, and gradually work up to solid foods.   3) Please notify your doctor immediately if you have any unusual bleeding, trouble breathing, redness and pain at the surgery site, drainage, fever, or pain not relieved by medication.    4) Additional Instructions:        Please contact your physician with any problems or Same Day Surgery at [phone], Monday through Friday 6 am to 4 pm, or [place] [facility] number at [phone].

## 2020-01-19 NOTE — OR Nursing (Signed)
Patient given incentive spirometer, instructed with return demonstration.

## 2020-01-19 NOTE — Anesthesia Procedure Notes (Addendum)
Procedure Name: Intubation Date/Time: 01/19/2020 8:34 AM Performed by: Ruffin Pyo, RN Pre-anesthesia Checklist: Patient identified, Patient being monitored, Timeout performed, Emergency Drugs available and Suction available Patient Re-evaluated:Patient Re-evaluated prior to induction Oxygen Delivery Method: Circle system utilized Preoxygenation: Pre-oxygenation with 100% oxygen Induction Type: IV induction Ventilation: Mask ventilation without difficulty Laryngoscope Size: 3 and McGraph Grade View: Grade I Tube type: Oral Tube size: 7.0 mm Number of attempts: 1 Airway Equipment and Method: Stylet Placement Confirmation: ETT inserted through vocal cords under direct vision,  positive ETCO2 and breath sounds checked- equal and bilateral Secured at: 21 cm Tube secured with: Tape Dental Injury: Teeth and Oropharynx as per pre-operative assessment

## 2020-01-19 NOTE — Anesthesia Preprocedure Evaluation (Addendum)
Anesthesia Evaluation  Patient identified by MRN, date of birth, ID band Patient awake    Reviewed: Allergy & Precautions, NPO status , Patient's Chart, lab work & pertinent test results  History of Anesthesia Complications Negative for: history of anesthetic complications  Airway Mallampati: III       Dental   Pulmonary neg sleep apnea, neg COPD, Not current smoker,           Cardiovascular hypertension, Pt. on medications (-) Past MI and (-) CHF (-) dysrhythmias (-) Valvular Problems/Murmurs     Neuro/Psych neg Seizures    GI/Hepatic Neg liver ROS, neg GERD  ,  Endo/Other  diabetes, Type 2, Oral Hypoglycemic AgentsMorbid obesity  Renal/GU negative Renal ROS     Musculoskeletal   Abdominal   Peds  Hematology   Anesthesia Other Findings   Reproductive/Obstetrics                            Anesthesia Physical Anesthesia Plan  ASA: III  Anesthesia Plan: General   Post-op Pain Management:  Regional for Post-op pain   Induction: Intravenous  PONV Risk Score and Plan: 3  Airway Management Planned: Oral ETT  Additional Equipment:   Intra-op Plan:   Post-operative Plan:   Informed Consent: I have reviewed the patients History and Physical, chart, labs and discussed the procedure including the risks, benefits and alternatives for the proposed anesthesia with the patient or authorized representative who has indicated his/her understanding and acceptance.       Plan Discussed with:   Anesthesia Plan Comments:         Anesthesia Quick Evaluation

## 2020-01-19 NOTE — OR Nursing (Signed)
Pt discharge instructions given by interpreter at bedside.

## 2020-01-19 NOTE — Anesthesia Procedure Notes (Signed)
Anesthesia Regional Block: Interscalene brachial plexus block   Pre-Anesthetic Checklist: ,, timeout performed, Correct Patient, Correct Site, Correct Laterality, Correct Procedure, Correct Position, site marked, Risks and benefits discussed,  Surgical consent,  Pre-op evaluation,  At surgeon's request and post-op pain management  Laterality: Left  Prep: chloraprep       Needles:  Injection technique: Single-shot  Needle Type: Echogenic Stimulator Needle     Needle Length: 9cm  Needle Gauge: 22     Additional Needles:   Procedures:, nerve stimulator,,, ultrasound used (permanent image in chart),,,,   Nerve Stimulator or Paresthesia:  Response: biceps flexion,   Additional Responses:   Narrative:  Start time: 01/19/2020 8:03 AM End time: 01/19/2020 8:16 AM Injection made incrementally with aspirations every 5 mL.  Performed by: Personally   Additional Notes: Functioning IV was confirmed and monitors were applied.  A 71mm 22ga Stimuplex needle was used. Sterile prep and drape,hand hygiene and sterile gloves were used.  Negative aspiration and negative test dose prior to incremental administration of local anesthetic. The patient tolerated the procedure well.

## 2020-01-19 NOTE — H&P (Signed)
History of Present Illness:  Paula Flores is a 54 y.o. female who presents today for history and physical for left shoulder arthroscopy with debridement, decompression, and possible rotator cuff repair with possible biceps tenodesis with Dr. Roland Rack on 01/19/2020. Patient has seen Dr. Roland Rack, discussed surgical intervention and agreed and consented to the procedure. Patient's symptoms began several years ago and developed with an injury at work, caused by slipping and falling on some ice in a parking [place]. Initially, the patient was treated with several courses of physical therapy, as well as a steroid injection. The steroid injection provided approximate 4 to 5 months worth of relief before her symptoms began to recur. After several years of nonsurgical treatment, the patient finally was sent for an MRI scan, then was told that the patient did not require surgical intervention. However, the patient has continued to have pain in her shoulder, especially with activities at or above shoulder level, prompting her to seek a second opinion. She saw Dr. Thomasene Flores who tried several steroid injections which provided only limited temporary relief of her symptoms. Therefore, the patient was referred to me for further evaluation and treatment. The patient describes the symptoms as moderate (patient is active but has had to make modifications or give up activities) and have the quality of being aching, miserable, nagging and throbbing. The pain is localized to the lateral arm/shoulder. These symptoms are aggravated constantly, with normal daily activities, with sleeping, carrying heavy objects, at higher levels of activity, with overhead activity, reaching behind the back and exercising. She has tried acetaminophen and non-steroidal anti-inflammatories (Lodine) with temporary partial relief. She has tried rest and ice with no significant benefit. She has tried several steroid injections which have provided short-term relief of  her symptoms, as well as several courses of physical therapy, as described above. The patient denies any neck pain, noted she note any numbness paresthesias down her arm to her hand.   Patient states this is not Worker's Comp this will be covered by her personal insurance.  Shoulder Surgical History:  The patient has had no shoulder surgery in the past.  PMH/PSH/Family History/Social History/Meds/Allergies:  I have reviewed past medical, surgical, social and family history, medications and allergies as documented in the EMR.  Current Outpatient Medications: . cholecalciferol (VITAMIN D3) 1,250 mcg (50,000 unit) capsule Take by mouth  . rosuvastatin (CRESTOR) 10 MG tablet Take by mouth  . acetaminophen (TYLENOL) 500 MG tablet Take by mouth  . chlorthalidone 25 MG tablet Take 25 mg by mouth once daily.  Marland Kitchen etodolac (LODINE) 400 MG tablet Take 1 tablet (400 mg total) by mouth 2 (two) times daily as needed for up to 120 doses 60 tablet 1  . glipiZIDE (GLUCOTROL XL) 5 MG XL tablet Take 5 mg by mouth 2 (two) times daily  . losartan (COZAAR) 100 MG tablet Take 100 mg by mouth once daily.  . metFORMIN (GLUCOPHAGE) 500 MG tablet Take 1 tablet by mouth 2 (two) times daily  . metoprolol tartrate (LOPRESSOR) 25 MG tablet Take 50 mg by mouth 2 (two) times daily.   No current Epic-ordered facility-administered medications on file.   Allergies:  . Penicillin Rash   Past Medical History:  . Hypertension  . Type II diabetes mellitus (CMS-HCC)   Past Surgical History:  . CESAREAN SECTION 1988  . CHOLECYSTECTOMY 1994  . Right Foot Surgery 2014  . TONSILLECTOMY 2008  . TUBAL LIGATION 1994   Family History:  . High blood pressure (Hypertension) Mother  .  No Known Problems Father   Social History:   Socioeconomic History  . Marital status: Married  Spouse name: Not on file  . Number of children: Not on file  . Years of education: Not on file  . Highest education level: Not on file   Occupational History  . Not on file  Tobacco Use  . Smoking status: Never Smoker  . Smokeless tobacco: Never Used  Vaping Use  . Vaping Use: Never used  Substance and Sexual Activity  . Alcohol use: Yes  Comment: Socially  . Drug use: No  . Sexual activity: Defer  Other Topics Concern  . Not on file  Social History Narrative  . Not on file   Social Determinants of Health   Financial Resource Strain:  . Difficulty of Paying Living Expenses:  Food Insecurity:  . Worried About Charity fundraiser in the Last Year:  . Arboriculturist in the Last Year:  Transportation Needs:  . Film/video editor (Medical):  Marland Kitchen Lack of Transportation (Non-Medical):   Review of Systems:  A comprehensive 14 point ROS was performed, reviewed, and the pertinent orthopaedic findings are documented in the HPI.  Physical Exam:  Vitals:  01/15/20 0917  BP: 132/84  Weight: (!) 147.1 kg (324 lb 3.2 oz)  Height: 167.6 cm (5\' 6" )  PainSc: 8  PainLoc: Shoulder   General:  Well developed, well nourished, no apparent distress, normal affect, normal gait with no antalgic component.   HEENT: Head normocephalic, atraumatic, PERRL.   Abdomen: Soft, non tender, non distended, Bowel sounds present.  Heart: Examination of the heart reveals regular, rate, and rhythm. There is no murmur noted on ascultation. There is a normal apical pulse.  Lungs: Lungs are clear to auscultation. There is no wheeze, rhonchi, or crackles. There is normal expansion of bilateral chest walls.   Left shoulder exam: SKIN: normal SWELLING: none WARMTH: none LYMPH NODES: no adenopathy palpable CREPITUS: none TENDERNESS: Minimal tenderness to palpation along anterolateral acromion ROM (active):  Forward flexion: 145 degrees Abduction: 135 degrees Internal rotation: L3 ROM (passive):  Forward flexion: 150 degrees Abduction: 140 degrees ER/IR at 90 abd: 90 degrees / 65 degrees  She has mild pain with forward  flexion, abduction, and internal rotation.  STRENGTH: Forward flexion: 4/5 Abduction: 4+/5 External rotation: 4/5 Internal rotation: 4/5 Pain with RC testing: Mild pain with resisted abduction more so than with resisted forward flexion strength testing  STABILITY: Normal  SPECIAL TESTS: Luan Pulling' test: positive, mild Speed's test: negative Capsulitis - pain w/ passive ER: no Crossed arm test: Mildly positive Crank: Not evaluated Anterior apprehension: Negative Posterior apprehension: Not evaluated  She has a negative Spurling's test bilaterally. She is neurovascularly intact to the left upper extremity.  Imaging:  Shoulder Imaging, MRI: Left Shoulder: MRI Shoulder Cartilage: No cartilage abnormality. Moderate degenerative changes of the [facility] joint of uncertain clinical significance  MRI Shoulder Rotator Cuff: Partial thickness tear of the supraspinatus. No retraction. MRI Shoulder Labrum / Biceps: No labral tear or biceps abormality. MRI Shoulder Bone: Normal bone.  Assessment:  . Traumatic incomplete tear of left rotator cuff, subsequent encounter Yes  . Arthritis of left acromioclavicular joint  . Rotator cuff tendinitis, left  . BMI 50.0-59.9, adult (CMS-HCC)   Plan:  The treatment options were discussed with the patient. In addition, patient educational materials were provided regarding the diagnosis and treatment options. The patient is quite frustrated by her symptoms and functional limitations. Given the fact that her  symptoms have been present for over 3 years and that they have persisted during this time despite physical therapy, steroid injections, medications, etc., I have recommended that we proceed with surgical intervention to include a left shoulder arthroscopy with debridement, decompression, and possible rotator cuff repair with possible biceps tenodesis. The procedure was discussed with the patient, as were the potential risks (including bleeding, infection, nerve  and/or blood vessel injury, persistent or recurrent pain, failure of the repair, progression of arthritis, need for further surgery, blood clots, strokes, heart attacks and/or arhythmias, pneumonia, etc.) and benefits. The patient states her understanding and wishes to proceed. All of the patient's questions and concerns were answered. She can call any time with further concerns. She will follow up post-surgery, routine.   H&P reviewed and patient re-examined. No changes.

## 2020-01-20 ENCOUNTER — Encounter: Payer: Self-pay | Admitting: Surgery

## 2020-01-21 ENCOUNTER — Encounter: Payer: Self-pay | Admitting: Surgery

## 2020-01-22 DIAGNOSIS — M7522 Bicipital tendinitis, left shoulder: Secondary | ICD-10-CM | POA: Insufficient documentation

## 2020-01-22 DIAGNOSIS — M24112 Other articular cartilage disorders, left shoulder: Secondary | ICD-10-CM | POA: Insufficient documentation

## 2020-03-14 ENCOUNTER — Ambulatory Visit (INDEPENDENT_AMBULATORY_CARE_PROVIDER_SITE_OTHER): Payer: BC Managed Care – PPO | Admitting: Podiatry

## 2020-03-14 ENCOUNTER — Encounter: Payer: Self-pay | Admitting: Podiatry

## 2020-03-14 ENCOUNTER — Other Ambulatory Visit: Payer: Self-pay

## 2020-03-14 DIAGNOSIS — B351 Tinea unguium: Secondary | ICD-10-CM | POA: Diagnosis not present

## 2020-03-14 DIAGNOSIS — M79676 Pain in unspecified toe(s): Secondary | ICD-10-CM | POA: Diagnosis not present

## 2020-03-14 DIAGNOSIS — M778 Other enthesopathies, not elsewhere classified: Secondary | ICD-10-CM | POA: Diagnosis not present

## 2020-03-14 MED ORDER — TRIAMCINOLONE ACETONIDE 40 MG/ML IJ SUSP
20.0000 mg | Freq: Once | INTRAMUSCULAR | Status: AC
  Administered 2020-03-14: 20 mg

## 2020-03-14 NOTE — Progress Notes (Signed)
She presents today after having not seen her for several months with a chief complaint of pain to the dorsal aspect of the left foot over the right.  States that it seems to be doing a little worse.  Objective: Vital signs are stable alert oriented x3 she has pain on frontal plane range of motion across Lisfranc's joints.  There is a small nonpulsatile masses along the dorsal aspect consistent with spurring.  Assessment: Well-healed endoscopic plantar fasciotomy's with fasciitis.  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  Plan: At this point I injected the dorsal aspect of the left foot with 10 mg Kenalog 5 mg Marcaine point maximal tenderness to make sure she is doing well.  Follow-up with her on an as-needed basis.

## 2020-03-21 DIAGNOSIS — M79676 Pain in unspecified toe(s): Secondary | ICD-10-CM

## 2020-05-11 ENCOUNTER — Ambulatory Visit: Payer: BC Managed Care – PPO | Admitting: Podiatry

## 2020-07-20 ENCOUNTER — Ambulatory Visit: Payer: BC Managed Care – PPO | Admitting: Podiatry

## 2020-07-20 ENCOUNTER — Emergency Department
Admission: EM | Admit: 2020-07-20 | Discharge: 2020-07-21 | Disposition: A | Discharge status: Home / Self Care | Payer: BC Managed Care – PPO | Attending: Emergency Medicine | Admitting: Emergency Medicine

## 2020-07-20 ENCOUNTER — Emergency Department: Payer: BC Managed Care – PPO

## 2020-07-20 ENCOUNTER — Other Ambulatory Visit: Payer: Self-pay

## 2020-07-20 DIAGNOSIS — R519 Headache, unspecified: Secondary | ICD-10-CM | POA: Diagnosis not present

## 2020-07-20 DIAGNOSIS — B349 Viral infection, unspecified: Secondary | ICD-10-CM | POA: Insufficient documentation

## 2020-07-20 DIAGNOSIS — Z20822 Contact with and (suspected) exposure to covid-19: Secondary | ICD-10-CM | POA: Diagnosis not present

## 2020-07-20 DIAGNOSIS — Z8616 Personal history of COVID-19: Secondary | ICD-10-CM | POA: Diagnosis not present

## 2020-07-20 DIAGNOSIS — I10 Essential (primary) hypertension: Secondary | ICD-10-CM | POA: Insufficient documentation

## 2020-07-20 DIAGNOSIS — Z7984 Long term (current) use of oral hypoglycemic drugs: Secondary | ICD-10-CM | POA: Diagnosis not present

## 2020-07-20 DIAGNOSIS — Z79899 Other long term (current) drug therapy: Secondary | ICD-10-CM | POA: Insufficient documentation

## 2020-07-20 DIAGNOSIS — E119 Type 2 diabetes mellitus without complications: Secondary | ICD-10-CM | POA: Diagnosis not present

## 2020-07-20 DIAGNOSIS — M545 Low back pain, unspecified: Secondary | ICD-10-CM | POA: Diagnosis not present

## 2020-07-20 LAB — CBC
HCT: 41 % (ref 36.0–46.0)
Hemoglobin: 13.5 g/dL (ref 12.0–15.0)
MCH: 26.6 pg (ref 26.0–34.0)
MCHC: 32.9 g/dL (ref 30.0–36.0)
MCV: 80.9 fL (ref 80.0–100.0)
Platelets: 385 10*3/uL (ref 150–400)
RBC: 5.07 MIL/uL (ref 3.87–5.11)
RDW: 14 % (ref 11.5–15.5)
WBC: 9.4 10*3/uL (ref 4.0–10.5)
nRBC: 0 % (ref 0.0–0.2)

## 2020-07-20 LAB — BASIC METABOLIC PANEL
Anion gap: 11 (ref 5–15)
BUN: 16 mg/dL (ref 6–20)
CO2: 27 mmol/L (ref 22–32)
Calcium: 10 mg/dL (ref 8.9–10.3)
Chloride: 101 mmol/L (ref 98–111)
Creatinine, Ser: 1 mg/dL (ref 0.44–1.00)
GFR, Estimated: >60 mL/min (ref >60)
Glucose, Bld: 180 mg/dL — ABNORMAL HIGH (ref 70–99)
Potassium: 3.7 mmol/L (ref 3.5–5.1)
Sodium: 139 mmol/L (ref 135–145)

## 2020-07-20 NOTE — ED Provider Notes (Signed)
[facility] Emergency Department Provider Note  ____________________________________________  Time seen: Approximately 11:58 PM  I have reviewed the triage vital signs and the nursing notes.   HISTORY  Chief Complaint Hypertension   HPI Paula Flores is a 55 y.o. female with a history of anemia, arthritis, diabetes, hypertension  who presents for evaluation of hypertension.  Patient reports that she has felt unwell for 2 days with a headache, chills, low-grade fever, and lower back pain.  She describes the low back pain as a dull midline mild pain.  She decided to check her blood pressure at home which was elevated with a reading of 178/107.  Patient does take medication for her elevated blood pressure and endorses compliance with it.  She reports taking some ibuprofen and Tylenol and both her back pain and her headache are pretty much gone at this time.  She denies cough or sore throat, shortness of breath or chest pain, abdominal pain, vomiting, diarrhea, dysuria or hematuria.  Past Medical History:  Diagnosis Date  . Anemia   . Arthritis   . Bacterial infection due to H. pylori   . COVID-19 virus detected 6/13/20230  . Diabetes mellitus without complication ([place])   . Hypertension   . Lab test positive for detection of COVID-19 virus 10/16/2018  . Mastitis 2014    Patient Active Problem List   Diagnosis Date Noted  . Encounter for screening colonoscopy   . Polyp of colon   . BMI 50.0-59.9, adult ([place]) 04/06/2019  . Acute respiratory disease due to COVID-19 virus 10/15/2018  . Acute respiratory failure with hypoxia ([place]) 10/15/2018  . T2DM (type 2 diabetes mellitus) ([place]) 10/15/2018  . Obesity, Class III, BMI 40-49.9 (morbid obesity) ([place]) 10/15/2018  . Shoulder pain 05/25/2016  . HTN (hypertension) 07/06/2015  . 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 Lump or mass in breast 10/27/2012  . Mastitis 10/27/2012    Past Surgical History:   Procedure Laterality Date  . ABLATION ON ENDOMETRIOSIS  09/04/2011  . BOWEL RESECTION  1994  . BREAST BIOPSY  11/03/2014  . CESAREAN SECTION  1988  . CHOLECYSTECTOMY  1994   had bowel resection  . COLONOSCOPY WITH PROPOFOL N/A 11/24/2019   Procedure: COLONOSCOPY WITH PROPOFOL;  Surgeon: Virgel Manifold, MD;  Location: [facility] ENDOSCOPY;  Service: Endoscopy;  Laterality: N/A;  . FOOT SURGERY Right 2014  . HYSTEROSCOPY WITH D & C N/A 10/13/2015   Procedure: DILATATION AND CURETTAGE /HYSTEROSCOPY;  Surgeon: Malachy Mood, MD;  Location: [facility] ORS;  Service: Gynecology;  Laterality: N/A;  . SHOULDER ARTHROSCOPY WITH OPEN ROTATOR CUFF REPAIR Left 01/19/2020   Procedure: LEFT SHOULDER ARTHROSCOPY WITH DEBRIDEMENT, DECOMPRESSION, POSSIBLE ROTATOR CUFF REPAIR, AND POSSIBLE BICEPS TENODESIS;  Surgeon: Corky Mull, MD;  Location: [facility] ORS;  Service: Orthopedics;  Laterality: Left;  . TONSILLECTOMY    . TONSILLECTOMY  2008  . TUBAL LIGATION  1994    Prior to Admission medications   Medication Sig Start Date End Date Taking? Authorizing Provider  acetaminophen (TYLENOL) 500 MG tablet Take 1,000 mg by mouth 2 (two) times daily as needed for moderate pain or headache.    [provider]  chlorthalidone (HYGROTON) 25 MG tablet Take 25 mg by mouth daily.    [provider]  D3-50 1.25 MG (50000 UT) capsule Take 50,000 Units by mouth every Thursday.  12/15/19   [provider]  etodolac (LODINE) 400 MG tablet Take 400 mg by mouth 2 (two) times  daily as needed for mild pain.  07/11/19   [provider]  glipiZIDE (GLUCOTROL XL) 5 MG 24 hr tablet Take 2 tablets (10 mg total) by mouth daily. Patient taking differently: Take 5 mg by mouth 2 (two) times daily.  10/19/18   Patrecia Pour, MD  losartan (COZAAR) 100 MG tablet Take 100 mg by mouth daily. 08/27/19   [provider]  metFORMIN (GLUCOPHAGE) 500 MG tablet Take 1 tablet (500 mg total) by mouth 2 (two) times  daily with a meal for 30 days. 10/19/18 01/11/20  Patrecia Pour, MD  metoprolol tartrate (LOPRESSOR) 50 MG tablet Take 50 mg by mouth 2 (two) times daily. 01/20/19   [provider]  oxyCODONE (ROXICODONE) 5 MG immediate release tablet Take 1-2 tablets (5-10 mg total) by mouth every 4 (four) hours as needed for moderate pain or severe pain. 01/19/20   Poggi, Marshall Cork, MD  rosuvastatin (CRESTOR) 10 MG tablet Take 10 mg by mouth daily. 12/29/19   [provider]    Allergies Penicillins  Family History  Problem Relation Age of Onset  . Cervical cancer Maternal Grandmother     Social History Social History   Tobacco Use  . Smoking status: Never Smoker  . Smokeless tobacco: Never Used  Vaping Use  . Vaping Use: Never used  Substance Use Topics  . Alcohol use: Yes    Comment: OCCASIONALLY  . Drug use: No    Review of Systems  Constitutional: Negative for fever. + chills, malaise Eyes: Negative for visual changes. ENT: Negative for sore throat. Neck: No neck pain  Cardiovascular: Negative for chest pain. Respiratory: Negative for shortness of breath. Gastrointestinal: Negative for abdominal pain, vomiting or diarrhea. Genitourinary: Negative for dysuria. Musculoskeletal: + low back pain. Skin: Negative for rash. Neurological: Negative for  weakness or numbness. + HA Psych: No SI or HI  ____________________________________________   PHYSICAL EXAM:  VITAL SIGNS: Vitals:   07/20/20 2059 07/20/20 2313  BP: (!) 195/122 140/90  Pulse: (!) 111 88  Resp: 20 18  Temp: 99.4 F (37.4 C)   SpO2: 95% 96%    Constitutional: Alert and oriented. Well appearing and in no apparent distress. HEENT:      Head: Normocephalic and atraumatic.         Eyes: Conjunctivae are normal. Sclera is non-icteric.       Mouth/Throat: Mucous membranes are moist.       Neck: Supple with no signs of meningismus. Cardiovascular: Regular rate and rhythm. No murmurs, gallops, or rubs. 2+  symmetrical distal pulses are present in all extremities. No JVD. Respiratory: Normal respiratory effort. Lungs are clear to auscultation bilaterally.  Gastrointestinal: Soft, non tender, and non distended with positive bowel sounds. No rebound or guarding. Genitourinary: No CVA tenderness. Musculoskeletal:  No edema, cyanosis, or erythema of extremities.  No midline CT and L-spine tenderness.  Mild bilateral paraspinal tenderness in the lumbar region Neurologic: Normal speech and language. Face is symmetric. Moving all extremities. No gross focal neurologic deficits are appreciated. Skin: Skin is warm, dry and intact. No rash noted. Psychiatric: Mood and affect are normal. Speech and behavior are normal.  ____________________________________________   LABS (all labs ordered are listed, but only abnormal results are displayed)  Labs Reviewed  BASIC METABOLIC PANEL - Abnormal; Notable for the following components:      Result Value   Glucose, Bld 180 (*)    All other components within normal limits  URINALYSIS, COMPLETE (UACMP) WITH  MICROSCOPIC - Abnormal; Notable for the following components:   Color, Urine YELLOW (*)    APPearance CLEAR (*)    Protein, ur 30 (*)    All other components within normal limits  RESP PANEL BY RT-PCR (FLU A&B, COVID) ARPGX2  CBC   ____________________________________________  EKG  ED ECG REPORT I, Rudene Re, the attending physician, personally viewed and interpreted this ECG.  Sinus tachycardia, rate of 109, normal intervals, left axis deviation, LAFB, no ST elevations or depressions.  Tachycardia is new when compared to prior ____________________________________________  RADIOLOGY I have personally reviewed the images performed during this visit and I agree with the Radiologist's read.   Interpretation by Radiologist:  CT Head Wo Contrast  Result Date: 07/21/2020 CLINICAL DATA:  Headache EXAM: CT HEAD WITHOUT CONTRAST TECHNIQUE:  Contiguous axial images were obtained from the base of the skull through the vertex without intravenous contrast. COMPARISON:  None. FINDINGS: Brain: There is no mass, hemorrhage or extra-axial collection. The size and configuration of the ventricles and extra-axial CSF spaces are normal. The brain parenchyma is normal, without acute or chronic infarction. Vascular: No abnormal hyperdensity of the major intracranial arteries or dural venous sinuses. No intracranial atherosclerosis. Skull: The visualized skull base, calvarium and extracranial soft tissues are normal. Sinuses/Orbits: No fluid levels or advanced mucosal thickening of the visualized paranasal sinuses. No mastoid or middle ear effusion. The orbits are normal. IMPRESSION: Normal head CT. Electronically Signed   By: Ulyses Jarred M.D.   On: 07/21/2020 02:24   DG Chest Portable 1 View  Result Date: 07/20/2020 CLINICAL DATA:  [place] EXAM: PORTABLE CHEST 1 VIEW COMPARISON:  10/15/2018 FINDINGS: The heart size and mediastinal contours are within normal limits. Both lungs are clear. The visualized skeletal structures are unremarkable. IMPRESSION: No active disease. Electronically Signed   By: Inez Catalina M.D.   On: 07/20/2020 23:40     ____________________________________________   PROCEDURES  Procedure(s) performed:yes .1-3 Lead EKG Interpretation Performed by: Rudene Re, MD Authorized by: Rudene Re, MD     Interpretation: non-specific     ECG rate assessment: normal     Rhythm: sinus rhythm     Ectopy: none     Conduction: normal     Critical Care performed:  None ____________________________________________   INITIAL IMPRESSION / ASSESSMENT AND PLAN / ED COURSE  55 y.o. female with a history of anemia, arthritis, diabetes, hypertension  who presents for evaluation of hypertension in the setting of 2 days of malaise, HA, chills, low back pain.  Upon arrival to the ED patient was initially tachycardic and  hypertensive with BP of 195/122.  Temp of 99.14F. heart rate and blood pressure improved without any intervention.  No meningeal signs, no midline CT no spine tenderness, abdomen is soft and nontender, lungs are clear to auscultation.  Since patient has had chills and malaise we will check for infection including a UTI, will get a chest x-ray and check some basic blood work. Will check for DKA. Will check for covid and flu.  Patient has very minimal bilateral paraspinal tenderness with no midline tenderness, no rashes, no signs of cauda equina. No thunderclap HA, no neuro deficits, no meningeal signs.    _________________________ 2:35 AM on 07/21/2020 -----------------------------------------  UA negative for UTI, no signs of DKA or dehydration, chest x-ray visualized by me with no signs of infection, confirmed by radiology.  Covid and flu negative.  Head CT visualized by me with no acute intracranial findings, confirmed by radiology.  Will discharge patient home on supportive care follow-up with PCP.  Most likely viral syndrome causing patient's symptoms and elevated blood pressure.  BP remains normal here.  Discussed my standard return precautions and follow-up      _____________________________________________ Please note:  Patient was evaluated in Emergency Department today for the symptoms described in the history of present illness. Patient was evaluated in the context of the global COVID-19 pandemic, which necessitated consideration that the patient might be at risk for infection with the SARS-CoV-2 virus that causes COVID-19. Institutional protocols and algorithms that pertain to the evaluation of patients at risk for COVID-19 are in a state of rapid change based on information released by regulatory bodies including the CDC and federal and state organizations. These policies and algorithms were followed during the patient's care in the ED.  Some ED evaluations and interventions may be delayed  as a result of limited staffing during the pandemic.   [place] Controlled Substance Database was reviewed by me. ____________________________________________   FINAL CLINICAL IMPRESSION(S) / ED DIAGNOSES   Final diagnoses:  Primary hypertension  Viral illness      NEW MEDICATIONS STARTED DURING THIS VISIT:  ED Discharge Orders    None       Note:  This document was prepared using Dragon voice recognition software and may include unintentional dictation errors.    Rudene Re, MD 07/21/20 [id number]

## 2020-07-20 NOTE — ED Triage Notes (Addendum)
Pt states she checked her BP at home and got a reading of 178/107, pt states she started feeling sick last night with chills and fatigue. Pt states she has a slight headache as well. Pt states she has been taking her BP meds as she should be. Pt denies chest pain

## 2020-07-21 ENCOUNTER — Emergency Department: Payer: BC Managed Care – PPO

## 2020-07-21 LAB — RESP PANEL BY RT-PCR (FLU A&B, COVID) ARPGX2
Influenza A by PCR: NEGATIVE
Influenza B by PCR: NEGATIVE
SARS Coronavirus 2 by RT PCR: NEGATIVE

## 2020-07-21 LAB — URINALYSIS, COMPLETE (UACMP) WITH MICROSCOPIC
Bacteria, UA: NONE SEEN
Bilirubin Urine: NEGATIVE
Glucose, UA: NEGATIVE mg/dL
Hgb urine dipstick: NEGATIVE
Ketones, ur: NEGATIVE mg/dL
Leukocytes,Ua: NEGATIVE
Nitrite: NEGATIVE
Protein, ur: 30 mg/dL — AB
Specific Gravity, Urine: 1.024 (ref 1.005–1.030)
pH: 5 (ref 5.0–8.0)

## 2020-07-21 MED ORDER — ACETAMINOPHEN 500 MG PO TABS
1000.0000 mg | ORAL_TABLET | Freq: Once | ORAL | Status: AC
  Administered 2020-07-21: 1000 mg via ORAL

## 2020-07-21 NOTE — ED Notes (Signed)
Pt in ct 

## 2020-08-03 ENCOUNTER — Ambulatory Visit: Payer: BC Managed Care – PPO | Admitting: Podiatry

## 2020-08-03 ENCOUNTER — Other Ambulatory Visit: Payer: Self-pay

## 2020-08-03 ENCOUNTER — Encounter: Payer: Self-pay | Admitting: Podiatry

## 2020-08-03 DIAGNOSIS — L600 Ingrowing nail: Secondary | ICD-10-CM | POA: Diagnosis not present

## 2020-08-03 MED ORDER — NEOMYCIN-POLYMYXIN-HC 1 % OT SOLN: OTIC | 1 refills | Status: DC

## 2020-08-03 NOTE — Patient Instructions (Signed)

## 2020-08-03 NOTE — Progress Notes (Signed)
She presents today for follow-up of her hallux nail right states that he grew back is dark and is painful.  I like to have it removed again.  Objective: Vital signs are stable she alert oriented x3.  Pulses are palpable.  She has a painful hallux nail right with mild erythema surrounding the the nail bed.  Assessment: Chronic ingrown nail hallux right.  Plan: Total matrixectomy was performed today after local anesthetic was administered she tolerated procedure well without complications.  She received both oral and written home-going instruction for the care and soaking of the toe as well as a prescription for Cortisporin Otic to be applied twice daily after soaking.  Follow-up with her in 2 weeks

## 2020-08-17 ENCOUNTER — Ambulatory Visit (INDEPENDENT_AMBULATORY_CARE_PROVIDER_SITE_OTHER): Payer: BC Managed Care – PPO | Admitting: Podiatry

## 2020-08-17 ENCOUNTER — Other Ambulatory Visit: Payer: Self-pay

## 2020-08-17 ENCOUNTER — Encounter: Payer: Self-pay | Admitting: Podiatry

## 2020-08-17 DIAGNOSIS — L600 Ingrowing nail: Secondary | ICD-10-CM

## 2020-08-17 DIAGNOSIS — Z9889 Other specified postprocedural states: Secondary | ICD-10-CM

## 2020-08-17 NOTE — Progress Notes (Signed)
She presents today after been soaking in Betadine for her total matrixectomy hallux right.  Dates that is fine still soaking and putting the medicine on it.  Objective: Vital signs are stable she is alert oriented x3 there is no erythema edema cellulitis drainage or odor.  Granulation tissue with epithelialization occurring is present.  .  Assessment: Well-healing surgical toe.  Plan: Continue to soak until no longer any drainage or tenderness.

## 2020-12-22 DIAGNOSIS — M79676 Pain in unspecified toe(s): Secondary | ICD-10-CM

## 2021-02-23 ENCOUNTER — Emergency Department: Payer: BC Managed Care – PPO

## 2021-02-23 ENCOUNTER — Encounter: Payer: Self-pay | Admitting: *Deleted

## 2021-02-23 ENCOUNTER — Emergency Department
Admission: EM | Admit: 2021-02-23 | Discharge: 2021-02-23 | Disposition: A | Discharge status: Home / Self Care | Payer: BC Managed Care – PPO | Attending: Emergency Medicine | Admitting: Emergency Medicine

## 2021-02-23 ENCOUNTER — Other Ambulatory Visit: Payer: Self-pay

## 2021-02-23 DIAGNOSIS — S92535A Nondisplaced fracture of distal phalanx of left lesser toe(s), initial encounter for closed fracture: Secondary | ICD-10-CM

## 2021-02-23 DIAGNOSIS — Z7984 Long term (current) use of oral hypoglycemic drugs: Secondary | ICD-10-CM | POA: Diagnosis not present

## 2021-02-23 DIAGNOSIS — S8992XA Unspecified injury of left lower leg, initial encounter: Secondary | ICD-10-CM | POA: Diagnosis present

## 2021-02-23 DIAGNOSIS — E119 Type 2 diabetes mellitus without complications: Secondary | ICD-10-CM | POA: Insufficient documentation

## 2021-02-23 DIAGNOSIS — Z8616 Personal history of COVID-19: Secondary | ICD-10-CM | POA: Insufficient documentation

## 2021-02-23 DIAGNOSIS — S92425A Nondisplaced fracture of distal phalanx of left great toe, initial encounter for closed fracture: Secondary | ICD-10-CM

## 2021-02-23 DIAGNOSIS — I10 Essential (primary) hypertension: Secondary | ICD-10-CM | POA: Insufficient documentation

## 2021-02-23 DIAGNOSIS — M1712 Unilateral primary osteoarthritis, left knee: Secondary | ICD-10-CM

## 2021-02-23 DIAGNOSIS — W19XXXA Unspecified fall, initial encounter: Secondary | ICD-10-CM | POA: Diagnosis not present

## 2021-02-23 NOTE — Discharge Instructions (Addendum)
You are being treated with a post-op shoe for your closed great toe & 4th toe fractures. Follow-up with Dr. Milinda Pointer for further fracture management.

## 2021-02-23 NOTE — ED Triage Notes (Signed)
Pt fell getting out of the car today.  Pt has left knee pain and left foot pain.   Abrasion and bruising noted to left great toe and left ankle.  Pt alert.

## 2021-02-24 ENCOUNTER — Ambulatory Visit: Payer: BC Managed Care – PPO | Admitting: Podiatry

## 2021-02-24 DIAGNOSIS — S90112A Contusion of left great toe without damage to nail, initial encounter: Secondary | ICD-10-CM

## 2021-02-24 MED ORDER — HYDROCODONE-ACETAMINOPHEN 5-325 MG PO TABS: 1.0000 | ORAL_TABLET | Freq: Four times a day (QID) | ORAL | 0 refills | Status: DC | PRN

## 2021-02-24 NOTE — Progress Notes (Signed)
   HPI: 55 y.o. female presenting today for new complaint regarding pain and tenderness to the left great toe.  Patient states that she sustained a fall injury when getting out of her car earlier yesterday on 02/23/2021.  She went to the emergency department where x-rays were taken and she was evaluated.  She presents today for follow-up treatment and evaluation  Past Medical History:  Diagnosis Date   Anemia    Arthritis    Bacterial infection due to H. pylori    COVID-19 virus detected 6/13/20230   Diabetes mellitus without complication ([place])    Hypertension    Lab test positive for detection of COVID-19 virus 10/16/2018   Mastitis 2014     Physical Exam: General: The patient is alert and oriented x3 in no acute distress.  Dermatology: Skin is warm, dry and supple bilateral lower extremities. Negative for open lesions or macerations.  Vascular: Palpable pedal pulses bilaterally.  Ecchymosis with edema localized to the left great toe Neurological: Epicritic and protective threshold grossly intact bilaterally.   Musculoskeletal Exam: No pedal deformities noted  Radiographic Exam LT foot 02/24/2021 [facility] ED:  FINDINGS: There is no evidence of fracture or dislocation. Small ossific density adjacent to the cuboid likely represents sequela of remote prior trauma. There are degenerative changes in the midfoot. Small calcaneal spurs. Regional soft tissues are unremarkable.   IMPRESSION: No acute osseous abnormality in the left foot.  Assessment: 1.  Left great toe contusion   Plan of Care:  1. Patient evaluated. X-Rays reviewed that were taken in the ED.  2.  Continue weightbearing as tolerated in a postsurgical shoe or cam boot.  Patient has both at home 3.  Refill prescription for Vicodin 5/3 2 5  mg 4.  Recommend ice daily with rest and elevation 5.  Return to clinic in 4 weeks      Edrick Kins, DPM [facility]  Dr. Edrick Kins, DPM    [address], [place] [address]                Office [phone]  Fax [phone]

## 2021-02-24 NOTE — ED Provider Notes (Signed)
[facility] Emergency Department Provider Note ____________________________________________  Time seen: 2148  I have reviewed the triage vital signs and the nursing notes.  HISTORY  Chief Complaint  Fall   HPI Paula Flores is a 55 y.o. female presents to the ED for evaluation of injury sustained following mechanical fall.  She reports falling and she did get out of the car earlier today, presents with pain to the left knee as well as pain to the left foot at the first and fourth toe specifically.  She is noted to have erythema and ecchymosis to the great toe.  Patient denies any head injury or LOC.  She gives a history of arthritis to the knees, which likely caused her fall initially.  Past Medical History:  Diagnosis Date   Anemia    Arthritis    Bacterial infection due to H. pylori    COVID-19 virus detected 6/13/20230   Diabetes mellitus without complication ([place])    Hypertension    Lab test positive for detection of COVID-19 virus 10/16/2018   Mastitis 2014    Patient Active Problem List   Diagnosis Date Noted   Encounter for screening colonoscopy    Polyp of colon    BMI 50.0-59.9, adult ([place]) 04/06/2019   Acute respiratory disease due to COVID-19 virus 10/15/2018   Acute respiratory failure with hypoxia ([place]) 10/15/2018   T2DM (type 2 diabetes mellitus) ([place]) 10/15/2018   Obesity, Class III, BMI 40-49.9 (morbid obesity) ([place]) 10/15/2018   Shoulder pain 05/25/2016   HTN (hypertension) 07/06/2015   Arthritis of knee, degenerative 02/02/2015   Lump or mass in breast 10/27/2012   Mastitis 10/27/2012    Past Surgical History:  Procedure Laterality Date   ABLATION ON ENDOMETRIOSIS  09/04/2011   BOWEL RESECTION  1994   BREAST BIOPSY  11/03/2014   [place]   had bowel resection   COLONOSCOPY WITH PROPOFOL N/A 11/24/2019   Procedure: COLONOSCOPY WITH PROPOFOL;  Surgeon: Virgel Manifold, MD;   Location: [facility] ENDOSCOPY;  Service: Endoscopy;  Laterality: N/A;   FOOT SURGERY Right 2014   HYSTEROSCOPY WITH D & C N/A 10/13/2015   Procedure: DILATATION AND CURETTAGE /HYSTEROSCOPY;  Surgeon: Malachy Mood, MD;  Location: [facility] ORS;  Service: Gynecology;  Laterality: N/A;   SHOULDER ARTHROSCOPY WITH OPEN ROTATOR CUFF REPAIR Left 01/19/2020   Procedure: LEFT SHOULDER ARTHROSCOPY WITH DEBRIDEMENT, DECOMPRESSION, POSSIBLE ROTATOR CUFF REPAIR, AND POSSIBLE BICEPS TENODESIS;  Surgeon: Corky Mull, MD;  Location: [facility] ORS;  Service: Orthopedics;  Laterality: Left;   TONSILLECTOMY     TONSILLECTOMY  2008   TUBAL LIGATION  1994    Prior to Admission medications   Medication Sig Start Date End Date Taking? Authorizing Provider  acetaminophen (TYLENOL) 500 MG tablet Take 1,000 mg by mouth 2 (two) times daily as needed for moderate pain or headache.    [provider]  chlorthalidone (HYGROTON) 25 MG tablet Take 25 mg by mouth daily.    [provider]  D3-50 1.25 MG (50000 UT) capsule Take 50,000 Units by mouth every Thursday.  12/15/19   [provider]  etodolac (LODINE) 400 MG tablet Take 400 mg by mouth 2 (two) times daily as needed for mild pain.  07/11/19   [provider]  glipiZIDE (GLUCOTROL XL) 5 MG 24 hr tablet Take 2 tablets (10 mg total) by mouth daily. Patient taking differently: Take 5 mg by mouth 2 (two) times daily.  10/19/18   Patrecia Pour, MD  losartan (COZAAR) 100 MG tablet Take 100 mg by mouth daily. 08/27/19   [provider]  metFORMIN (GLUCOPHAGE) 500 MG tablet Take 1 tablet (500 mg total) by mouth 2 (two) times daily with a meal for 30 days. 10/19/18 01/11/20  Patrecia Pour, MD  metoprolol tartrate (LOPRESSOR) 50 MG tablet Take 50 mg by mouth 2 (two) times daily. 01/20/19   [provider]  NEOMYCIN-POLYMYXIN-HYDROCORTISONE (CORTISPORIN) 1 % SOLN OTIC solution Apply 1-2 drops to toe BID after soaking 08/03/20   Hyatt, Max T, DPM   oxyCODONE (ROXICODONE) 5 MG immediate release tablet Take 1-2 tablets (5-10 mg total) by mouth every 4 (four) hours as needed for moderate pain or severe pain. 01/19/20   Poggi, Marshall Cork, MD  rosuvastatin (CRESTOR) 10 MG tablet Take 10 mg by mouth daily. 12/29/19   [provider]    Allergies Penicillins  Family History  Problem Relation Age of Onset   Cervical cancer Maternal Grandmother     Social History Social History   Tobacco Use   Smoking status: Never   Smokeless tobacco: Never  Vaping Use   Vaping Use: Never used  Substance Use Topics   Alcohol use: Not Currently    Comment: OCCASIONALLY   Drug use: No    Review of Systems  Constitutional: Negative for fever. Eyes: Negative for visual changes. ENT: Negative for sore throat. Cardiovascular: Negative for chest pain. Respiratory: Negative for shortness of breath. Gastrointestinal: Negative for abdominal pain, vomiting and diarrhea. Genitourinary: Negative for dysuria. Musculoskeletal: Negative for back pain.  Left first and fourth toe pain.  Left knee pain Skin: Negative for rash. Neurological: Negative for headaches, focal weakness or numbness. ____________________________________________  PHYSICAL EXAM:  VITAL SIGNS: ED Triage Vitals  Enc Vitals Group     BP 02/23/21 1908 121/74     Pulse Rate 02/23/21 1908 73     Resp 02/23/21 1908 16     Temp 02/23/21 1908 98.4 F (36.9 C)     Temp Source 02/23/21 1910 Oral     SpO2 02/23/21 1908 100 %     Weight 02/23/21 1905 (!) 323 lb (146.5 kg)     Height 02/23/21 1905 5\' 6"  (1.676 m)     Head Circumference --      Peak Flow --      Pain Score 02/23/21 1905 9     Pain Loc --      Pain Edu? --      Excl. in [place]? --     Constitutional: Alert and oriented. Well appearing and in no distress. Head: Normocephalic and atraumatic. Eyes: Conjunctivae are normal. Normal extraocular movements Cardiovascular: Normal rate, regular rhythm. Normal distal  pulses. Respiratory: Normal respiratory effort. No wheezes/rales/rhonchi. Gastrointestinal: Soft and nontender. No distention. Musculoskeletal: Nontender with normal range of motion in all extremities.  Left great toe with significant soft tissue swelling and ecchymosis noted.  Similar erythema is noted to the left fourth toe.  Great toenail is surgically absent. Neurologic:  Normal gait without ataxia. Normal speech and language. No gross focal neurologic deficits are appreciated. Skin:  Skin is warm, dry and intact. No rash noted. Psychiatric: Mood and affect are normal. Patient exhibits appropriate insight and judgment. ____________________________________________    {LABS (pertinent positives/negatives)  ____________________________________________  {EKG  ____________________________________________   RADIOLOGY Official radiology report(s): DG Knee Complete 4 Views Left  Result Date: 02/23/2021 CLINICAL DATA:  Left knee and foot pain. Patient  fell getting out of car today. EXAM: LEFT KNEE - COMPLETE 4+ VIEW COMPARISON:  Left knee radiographs 12/10/2011 FINDINGS: No definite acute fracture. No evidence of dislocation. No joint effusion. There are severe tricompartmental degenerative changes including joint space loss and osteophytes. Regional soft tissues are unremarkable. IMPRESSION: No acute osseous abnormality. Severe tricompartmental osteoarthritis. Electronically Signed   By: Audie Pinto M.D.   On: 02/23/2021 19:50   DG Foot Complete Left  Result Date: 02/23/2021 CLINICAL DATA:  Left foot pain.  Bruising noted to the great toe. EXAM: LEFT FOOT - COMPLETE 3+ VIEW COMPARISON:  None. FINDINGS: There is no evidence of fracture or dislocation. Small ossific density adjacent to the cuboid likely represents sequela of remote prior trauma. There are degenerative changes in the midfoot. Small calcaneal spurs. Regional soft tissues are unremarkable. IMPRESSION: No acute osseous  abnormality in the left foot. Electronically Signed   By: Audie Pinto M.D.   On: 02/23/2021 19:53    I, Melvenia Needles, personally viewed and evaluated these images (plain radiographs) as part of my medical decision making, as well as reviewing the written report by the radiologist.  The radiologist appears to have missed an oblique fracture to the distal phalanx of the patient's great toe.  This likely also underlying oblique fracture of the distal phalanx of the fourth toe.  ____________________________________________  PROCEDURES  Postop shoe  Procedures ____________________________________________   INITIAL IMPRESSION / ASSESSMENT AND PLAN / ED COURSE  As part of my medical decision making, I reviewed the following data within the [place] reviewed fractures per my interpretation and Notes from prior ED visits  DDX: toe fracture, foot sprain, DJD  Patient with ED evaluation initial fracture care of closed distal Athanas fractures of the first and fourth toes on left foot.  No radiologic evidence of fracture or dislocation on the left knee.  Patient will be placed in a postop shoe, and referred to her current podiatrist for further evaluation and management of her fractures.  She will take her previously prescribed pain medicine as directed and follow-up as discussed.  Return precautions of been reviewed.  RAYNELLE FUJIKAWA was evaluated in Emergency Department on 02/24/2021 for the symptoms described in the history of present illness. She was evaluated in the context of the global COVID-19 pandemic, which necessitated consideration that the patient might be at risk for infection with the SARS-CoV-2 virus that causes COVID-19. Institutional protocols and algorithms that pertain to the evaluation of patients at risk for COVID-19 are in a state of rapid change based on information released by regulatory bodies including the CDC and federal and state  organizations. These policies and algorithms were followed during the patient's care in the ED. ____________________________________________  FINAL CLINICAL IMPRESSION(S) / ED DIAGNOSES  Final diagnoses:  Closed nondisplaced fracture of distal phalanx of left great toe, initial encounter  Closed nondisplaced fracture of distal phalanx of lesser toe of left foot, initial encounter  Primary osteoarthritis of left knee      Junelle Hashemi, Dannielle Karvonen, PA-C 02/24/21 0108    Nena Polio, MD 02/24/21 1614

## 2021-03-03 ENCOUNTER — Other Ambulatory Visit: Payer: Self-pay | Admitting: Surgery

## 2021-03-03 DIAGNOSIS — M7522 Bicipital tendinitis, left shoulder: Secondary | ICD-10-CM

## 2021-03-03 DIAGNOSIS — S46012D Strain of muscle(s) and tendon(s) of the rotator cuff of left shoulder, subsequent encounter: Secondary | ICD-10-CM

## 2021-03-03 DIAGNOSIS — M7582 Other shoulder lesions, left shoulder: Secondary | ICD-10-CM

## 2021-03-07 DIAGNOSIS — M79676 Pain in unspecified toe(s): Secondary | ICD-10-CM

## 2021-03-12 ENCOUNTER — Ambulatory Visit: Admission: RE | Admit: 2021-03-12 | Payer: BC Managed Care – PPO | Source: Ambulatory Visit

## 2021-03-27 ENCOUNTER — Ambulatory Visit: Payer: BC Managed Care – PPO | Admitting: Podiatry

## 2021-03-28 ENCOUNTER — Ambulatory Visit: Payer: BC Managed Care – PPO | Admitting: Podiatry

## 2021-03-28 ENCOUNTER — Ambulatory Visit: Payer: BC Managed Care – PPO

## 2021-04-25 ENCOUNTER — Ambulatory Visit: Payer: BC Managed Care – PPO | Admitting: Podiatry

## 2021-05-31 ENCOUNTER — Ambulatory Visit: Payer: BC Managed Care – PPO

## 2021-06-19 ENCOUNTER — Other Ambulatory Visit: Payer: Self-pay

## 2021-06-19 ENCOUNTER — Ambulatory Visit
Admission: RE | Admit: 2021-06-19 | Discharge: 2021-06-19 | Disposition: A | Discharge status: Home / Self Care | Payer: BC Managed Care – PPO | Source: Ambulatory Visit | Attending: Surgery | Admitting: Surgery

## 2021-06-19 DIAGNOSIS — M7582 Other shoulder lesions, left shoulder: Secondary | ICD-10-CM | POA: Diagnosis present

## 2021-06-19 DIAGNOSIS — M7522 Bicipital tendinitis, left shoulder: Secondary | ICD-10-CM | POA: Diagnosis not present

## 2021-06-19 DIAGNOSIS — S46012D Strain of muscle(s) and tendon(s) of the rotator cuff of left shoulder, subsequent encounter: Secondary | ICD-10-CM | POA: Insufficient documentation

## 2021-08-03 ENCOUNTER — Other Ambulatory Visit: Payer: Self-pay | Admitting: Internal Medicine

## 2021-08-03 DIAGNOSIS — R1030 Lower abdominal pain, unspecified: Secondary | ICD-10-CM

## 2021-08-08 ENCOUNTER — Ambulatory Visit
Admission: RE | Admit: 2021-08-08 | Discharge: 2021-08-08 | Disposition: A | Discharge status: Home / Self Care | Payer: BC Managed Care – PPO | Source: Ambulatory Visit | Attending: Internal Medicine | Admitting: Internal Medicine

## 2021-08-08 DIAGNOSIS — R1030 Lower abdominal pain, unspecified: Secondary | ICD-10-CM | POA: Insufficient documentation

## 2021-10-04 ENCOUNTER — Ambulatory Visit: Payer: BC Managed Care – PPO | Admitting: Podiatry

## 2021-10-10 ENCOUNTER — Ambulatory Visit: Payer: BC Managed Care – PPO | Admitting: Podiatry

## 2021-10-10 DIAGNOSIS — M7752 Other enthesopathy of left foot: Secondary | ICD-10-CM | POA: Diagnosis not present

## 2021-10-10 DIAGNOSIS — M778 Other enthesopathies, not elsewhere classified: Secondary | ICD-10-CM | POA: Diagnosis not present

## 2021-10-10 NOTE — Progress Notes (Signed)
Subjective:  Patient ID: Paula Flores, female    DOB: 03/31/1966,  MRN: [id number]  Chief Complaint  Patient presents with   Foot Pain    56 y.o. female presents with the above complaint.  Patient presents with complaint of right dorsal midfoot pain as well as left second metatarsophalangeal joint pain.  Patient states pain for touch is progressive gotten worse.  She states her lower back pain is gotten worse and she is putting more stress in the foot.  She states it hurts with ambulation.  She has not seen anyone else prior to seeing me.  She would like to know if she can steroid injection.  She is a diabetic with last A1c of8.5   Review of Systems: Negative except as noted in the HPI. Denies N/V/F/Ch.  Past Medical History:  Diagnosis Date   Anemia    Arthritis    Bacterial infection due to H. pylori    COVID-19 virus detected 6/13/20230   Diabetes mellitus without complication ([place])    Hypertension    Lab test positive for detection of COVID-19 virus 10/16/2018   Mastitis 2014    Current Outpatient Medications:    chlorthalidone (HYGROTON) 25 MG tablet, Take 25 mg by mouth daily., Disp: , Rfl:    D3-50 1.25 MG (50000 UT) capsule, Take 50,000 Units by mouth every Thursday. , Disp: , Rfl:    etodolac (LODINE) 400 MG tablet, Take 400 mg by mouth 2 (two) times daily as needed for mild pain. , Disp: , Rfl:    glipiZIDE (GLUCOTROL XL) 5 MG 24 hr tablet, Take 2 tablets (10 mg total) by mouth daily. (Patient taking differently: Take 5 mg by mouth 2 (two) times daily. ), Disp: , Rfl:    HYDROcodone-acetaminophen (NORCO/VICODIN) 5-325 MG tablet, Take 1 tablet by mouth every 6 (six) hours as needed for moderate pain., Disp: 30 tablet, Rfl: 0   losartan (COZAAR) 100 MG tablet, Take 100 mg by mouth daily., Disp: , Rfl:    metFORMIN (GLUCOPHAGE) 500 MG tablet, Take 1 tablet (500 mg total) by mouth 2 (two) times daily with a meal for 30 days., Disp: 60 tablet, Rfl: 0   metoprolol tartrate  (LOPRESSOR) 50 MG tablet, Take 50 mg by mouth 2 (two) times daily., Disp: , Rfl:    NEOMYCIN-POLYMYXIN-HYDROCORTISONE (CORTISPORIN) 1 % SOLN OTIC solution, Apply 1-2 drops to toe BID after soaking, Disp: 10 mL, Rfl: 1   rosuvastatin (CRESTOR) 10 MG tablet, Take 10 mg by mouth daily., Disp: , Rfl:   Social History   Tobacco Use  Smoking Status Never  Smokeless Tobacco Never    Allergies  Allergen Reactions   Penicillins Rash    Did it involve swelling of the face/tongue/throat, SOB, or low BP? N Did it involve sudden or severe rash/hives, skin peeling, or any reaction on the inside of your mouth or nose? Y Did you need to seek medical attention at a hospital or doctor's office? N When did it last happen?      childhood If all above answers are "NO", may proceed with cephalosporin use.    Objective:  There were no vitals filed for this visit. There is no height or weight on file to calculate BMI. Constitutional Well developed. Well nourished.  Vascular Dorsalis pedis pulses palpable bilaterally. Posterior tibial pulses palpable bilaterally. Capillary refill normal to all digits.  No cyanosis or clubbing noted. Pedal hair growth normal.  Neurologic Normal speech. Oriented to person, place, and time. Epicritic  sensation to light touch grossly present bilaterally.  Dermatologic Nails well groomed and normal in appearance. No open wounds. No skin lesions.  Orthopedic: Pain on palpation right dorsal midfoot.  Pain with palpation to the lateral part of it.  No extensor or flexor tendinitis noted.  Lisfranc interval.  Left second metatarsophalangeal joint pain.  Pain on palpation to the plantar side of it.  No pain with extensor or flexor tendinitis with resisted dorsiflexion plantarflexion of the digits.   Radiographs: None Assessment:   1. Capsulitis of right foot   2. Capsulitis of metatarsophalangeal (MTP) joint of left foot    Plan:  Patient was evaluated and treated and all  questions answered.  Right foot capsulitis -All questions and concerns were discussed with the patient in extensive detail given the amount of pain that she is having she will benefit from a steroid injection.  She agrees with plan like to proceed with steroid injection -A steroid injection was performed at Right dorsal midfoot lateral using 1% plain Lidocaine and 10 mg of Kenalog. This was well tolerated.  Left second metatarsophalangeal joint capsulitis -All questions and concerns were discussed with the patient extensive detail.  Given the amount of pain that she is having she will benefit from steroid injection as well. -A steroid injection was performed at Left second metatarsophalangeal joint using 1% plain Lidocaine and 10 mg of Kenalog. This was well tolerated.    No follow-ups on file.

## 2021-11-01 ENCOUNTER — Other Ambulatory Visit: Payer: Self-pay | Admitting: Physical Medicine & Rehabilitation

## 2021-11-01 DIAGNOSIS — M5441 Lumbago with sciatica, right side: Secondary | ICD-10-CM

## 2021-11-08 ENCOUNTER — Ambulatory Visit
Admission: RE | Admit: 2021-11-08 | Discharge: 2021-11-08 | Disposition: A | Discharge status: Home / Self Care | Payer: BC Managed Care – PPO | Source: Ambulatory Visit | Attending: Physical Medicine & Rehabilitation | Admitting: Physical Medicine & Rehabilitation

## 2021-11-08 DIAGNOSIS — M5441 Lumbago with sciatica, right side: Secondary | ICD-10-CM | POA: Diagnosis present

## 2022-02-01 ENCOUNTER — Ambulatory Visit: Payer: BC Managed Care – PPO | Admitting: Podiatry

## 2022-02-01 DIAGNOSIS — M778 Other enthesopathies, not elsewhere classified: Secondary | ICD-10-CM

## 2022-02-07 NOTE — Progress Notes (Signed)
Subjective:  Patient ID: Paula Flores, female    DOB: Apr 02, 1966,  MRN: [id number]  Chief Complaint  Patient presents with   Foot Pain    56 y.o. female presents with the above complaint.  Patient presents with follow-up of bilateral dorsal midfoot pain.  Patient states the MPJ joint is doing fine but midfoot strong active.  She wanted to discuss treatment injection for that.  Denies any other acute issues.   Review of Systems: Negative except as noted in the HPI. Denies N/V/F/Ch.  Past Medical History:  Diagnosis Date   Anemia    Arthritis    Bacterial infection due to H. pylori    COVID-19 virus detected 6/13/20230   Diabetes mellitus without complication ([place])    Hypertension    Lab test positive for detection of COVID-19 virus 10/16/2018   Mastitis 2014    Current Outpatient Medications:    chlorthalidone (HYGROTON) 25 MG tablet, Take 25 mg by mouth daily., Disp: , Rfl:    D3-50 1.25 MG (50000 UT) capsule, Take 50,000 Units by mouth every Thursday. , Disp: , Rfl:    etodolac (LODINE) 400 MG tablet, Take 400 mg by mouth 2 (two) times daily as needed for mild pain. , Disp: , Rfl:    glipiZIDE (GLUCOTROL XL) 5 MG 24 hr tablet, Take 2 tablets (10 mg total) by mouth daily. (Patient taking differently: Take 5 mg by mouth 2 (two) times daily. ), Disp: , Rfl:    HYDROcodone-acetaminophen (NORCO/VICODIN) 5-325 MG tablet, Take 1 tablet by mouth every 6 (six) hours as needed for moderate pain., Disp: 30 tablet, Rfl: 0   losartan (COZAAR) 100 MG tablet, Take 100 mg by mouth daily., Disp: , Rfl:    metFORMIN (GLUCOPHAGE) 500 MG tablet, Take 1 tablet (500 mg total) by mouth 2 (two) times daily with a meal for 30 days., Disp: 60 tablet, Rfl: 0   metoprolol tartrate (LOPRESSOR) 50 MG tablet, Take 50 mg by mouth 2 (two) times daily., Disp: , Rfl:    NEOMYCIN-POLYMYXIN-HYDROCORTISONE (CORTISPORIN) 1 % SOLN OTIC solution, Apply 1-2 drops to toe BID after soaking, Disp: 10 mL, Rfl: 1    rosuvastatin (CRESTOR) 10 MG tablet, Take 10 mg by mouth daily., Disp: , Rfl:   Social History   Tobacco Use  Smoking Status Never  Smokeless Tobacco Never    Allergies  Allergen Reactions   Penicillins Rash    Did it involve swelling of the face/tongue/throat, SOB, or low BP? N Did it involve sudden or severe rash/hives, skin peeling, or any reaction on the inside of your mouth or nose? Y Did you need to seek medical attention at a hospital or doctor's office? N When did it last happen?      childhood If all above answers are "NO", may proceed with cephalosporin use.    Objective:  There were no vitals filed for this visit. There is no height or weight on file to calculate BMI. Constitutional Well developed. Well nourished.  Vascular Dorsalis pedis pulses palpable bilaterally. Posterior tibial pulses palpable bilaterally. Capillary refill normal to all digits.  No cyanosis or clubbing noted. Pedal hair growth normal.  Neurologic Normal speech. Oriented to person, place, and time. Epicritic sensation to light touch grossly present bilaterally.  Dermatologic Nails well groomed and normal in appearance. No open wounds. No skin lesions.  Orthopedic: Pain on palpation bilateral dorsal midfoot.  Pain with palpation to the lateral part of it.  No extensor or flexor tendinitis noted.  Lisfranc interval.  Left second metatarsophalangeal joint pain.  Pain on palpation to the plantar side of it.  No pain with extensor or flexor tendinitis with resisted dorsiflexion plantarflexion of the digits.   Radiographs: None Assessment:   1. Capsulitis of right foot   2. Capsulitis of foot, left     Plan:  Patient was evaluated and treated and all questions answered.  Bilateral midfoot capsulitis -All questions and concerns were discussed with the patient in extensive detail given the amount of pain that she is having she will benefit from a steroid injection.  She agrees with plan like to  proceed with steroid injection -A steroid injection was performed at Right dorsal midfoot lateral using 1% plain Lidocaine and 10 mg of Kenalog. This was well tolerated.  Left second metatarsophalangeal joint capsulitis -Clinically healing well    No follow-ups on file.

## 2022-02-28 ENCOUNTER — Other Ambulatory Visit: Payer: Self-pay | Admitting: Gastroenterology

## 2022-03-07 NOTE — Progress Notes (Signed)
Reviewed the referral for elevated LFTs.  Patient has chronically elevated LFTs since 2020, has fatty liver based on the ultrasound.  There is no urgency to see her sooner than originally scheduled appointment which is in March 2024  RV

## 2022-03-22 ENCOUNTER — Other Ambulatory Visit: Payer: Self-pay | Admitting: Physical Medicine & Rehabilitation

## 2022-03-22 DIAGNOSIS — M542 Cervicalgia: Secondary | ICD-10-CM

## 2022-03-29 ENCOUNTER — Ambulatory Visit
Admission: RE | Admit: 2022-03-29 | Discharge: 2022-03-29 | Disposition: A | Discharge status: Home / Self Care | Payer: BC Managed Care – PPO | Source: Ambulatory Visit | Attending: Physical Medicine & Rehabilitation | Admitting: Physical Medicine & Rehabilitation

## 2022-03-29 DIAGNOSIS — M542 Cervicalgia: Secondary | ICD-10-CM | POA: Insufficient documentation

## 2022-06-20 ENCOUNTER — Ambulatory Visit (INDEPENDENT_AMBULATORY_CARE_PROVIDER_SITE_OTHER): Payer: BC Managed Care – PPO | Admitting: Podiatry

## 2022-06-20 DIAGNOSIS — M778 Other enthesopathies, not elsewhere classified: Secondary | ICD-10-CM | POA: Diagnosis not present

## 2022-06-20 NOTE — Progress Notes (Signed)
Subjective:  Patient ID: Paula Flores, female    DOB: 05/04/1965,  MRN: [id number]  Chief Complaint  Patient presents with   Foot Pain    57 y.o. female presents with the above complaint.  Patient presents with follow-up of bilateral dorsal midfoot pain.  Patient states the MPJ joint is doing fine but midfoot strong active.  She wanted to discuss treatment injection for that.  Denies any other acute issues.   Review of Systems: Negative except as noted in the HPI. Denies N/V/F/Ch.  Past Medical History:  Diagnosis Date   Anemia    Arthritis    Bacterial infection due to H. pylori    COVID-19 virus detected 6/13/20230   Diabetes mellitus without complication ([facility])    Hypertension    Lab test positive for detection of COVID-19 virus 10/16/2018   Mastitis 2014    Current Outpatient Medications:    chlorthalidone (HYGROTON) 25 MG tablet, Take 25 mg by mouth daily., Disp: , Rfl:    D3-50 1.25 MG (50000 UT) capsule, Take 50,000 Units by mouth every Thursday. , Disp: , Rfl:    etodolac (LODINE) 400 MG tablet, Take 400 mg by mouth 2 (two) times daily as needed for mild pain. , Disp: , Rfl:    glipiZIDE (GLUCOTROL XL) 5 MG 24 hr tablet, Take 2 tablets (10 mg total) by mouth daily. (Patient taking differently: Take 5 mg by mouth 2 (two) times daily. ), Disp: , Rfl:    HYDROcodone-acetaminophen (NORCO/VICODIN) 5-325 MG tablet, Take 1 tablet by mouth every 6 (six) hours as needed for moderate pain., Disp: 30 tablet, Rfl: 0   losartan (COZAAR) 100 MG tablet, Take 100 mg by mouth daily., Disp: , Rfl:    metFORMIN (GLUCOPHAGE) 500 MG tablet, Take 1 tablet (500 mg total) by mouth 2 (two) times daily with a meal for 30 days., Disp: 60 tablet, Rfl: 0   metoprolol tartrate (LOPRESSOR) 50 MG tablet, Take 50 mg by mouth 2 (two) times daily., Disp: , Rfl:    NEOMYCIN-POLYMYXIN-HYDROCORTISONE (CORTISPORIN) 1 % SOLN OTIC solution, Apply 1-2 drops to toe BID after soaking, Disp: 10 mL, Rfl: 1    rosuvastatin (CRESTOR) 10 MG tablet, Take 10 mg by mouth daily., Disp: , Rfl:   Social History   Tobacco Use  Smoking Status Never  Smokeless Tobacco Never    Allergies  Allergen Reactions   Penicillins Rash    Did it involve swelling of the face/tongue/throat, SOB, or low BP? N Did it involve sudden or severe rash/hives, skin peeling, or any reaction on the inside of your mouth or nose? Y Did you need to seek medical attention at a hospital or doctor's office? N When did it last happen?      childhood If all above answers are "NO", may proceed with cephalosporin use.    Objective:  There were no vitals filed for this visit. There is no height or weight on file to calculate BMI. Constitutional Well developed. Well nourished.  Vascular Dorsalis pedis pulses palpable bilaterally. Posterior tibial pulses palpable bilaterally. Capillary refill normal to all digits.  No cyanosis or clubbing noted. Pedal hair growth normal.  Neurologic Normal speech. Oriented to person, place, and time. Epicritic sensation to light touch grossly present bilaterally.  Dermatologic Nails well groomed and normal in appearance. No open wounds. No skin lesions.  Orthopedic: Pain on palpation bilateral dorsal midfoot.  Pain with palpation to the lateral part of it.  No extensor or flexor tendinitis noted.    Lisfranc interval.  Left second metatarsophalangeal joint pain.  Pain on palpation to the plantar side of it.  No pain with extensor or flexor tendinitis with resisted dorsiflexion plantarflexion of the digits.   Radiographs: None Assessment:   1. Capsulitis of right foot   2. Capsulitis of foot, left     Plan:  Patient was evaluated and treated and all questions answered.  Bilateral midfoot capsulitis -All questions and concerns were discussed with the patient in extensive detail given the amount of pain that she is having she will benefit from a steroid injection.  She agrees with plan like to  proceed with steroid injection -A steroid injection was performed at Right dorsal midfoot lateral using 1% plain Lidocaine and 10 mg of Kenalog. This was well tolerated.  Left second metatarsophalangeal joint capsulitis -Clinically healing well    No follow-ups on file.  

## 2022-07-05 ENCOUNTER — Other Ambulatory Visit: Payer: Self-pay

## 2022-07-10 ENCOUNTER — Encounter: Payer: Self-pay | Admitting: Gastroenterology

## 2022-07-10 ENCOUNTER — Ambulatory Visit: Payer: BC Managed Care – PPO | Admitting: Gastroenterology

## 2022-08-31 IMAGING — MR MR SHOULDER*L* W/O CM
4 of 5 series · 31 of 40 positions shown · non-contrast
Comparison: MR left shoulder arthrogram 09/07/2009

CLINICAL DATA: Surgery in 0508. Left neck acromioclavicular joint
and left arm pain. Cramping and left biceps.

EXAM:
MRI OF THE LEFT SHOULDER WITHOUT CONTRAST
TECHNIQUE: Multiplanar, multisequence MR imaging of the shoulder was performed.
No intravenous contrast was administered.

[Series 9: T2 fat-sat · axial · left · 4.0mm · 0.44mm/px · z∈[-151,-31]mm · 8 of 26 slices shown (1 of 3)]
[im 1/26]
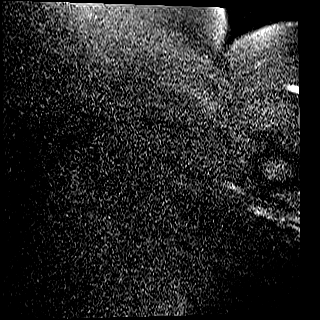
[im 4/26]
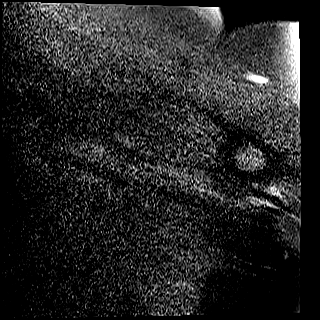
[im 8/26]
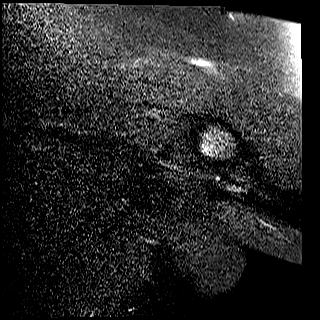
[im 11/26]
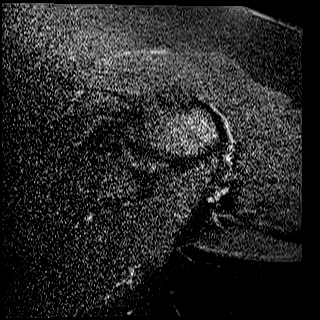
[im 15/26]
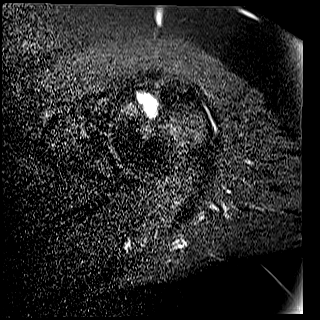
[im 18/26]
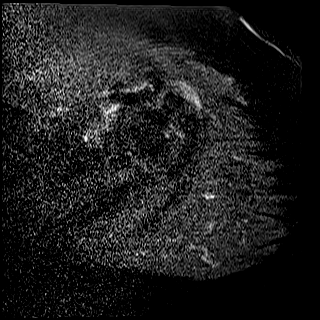
[im 22/26]
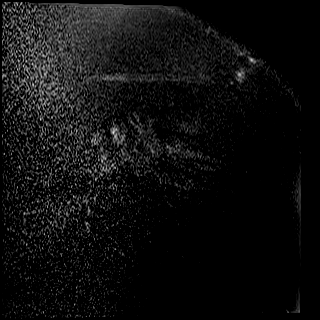
[im 26/26]
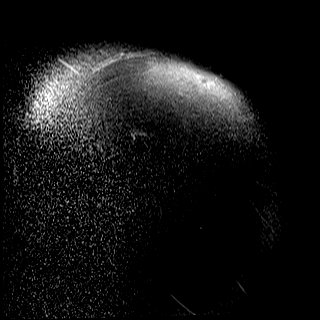

[Series 10: PD · oblique · left · 4.0mm · 0.44mm/px · 9 of 26 slices shown]
[im 1/26]
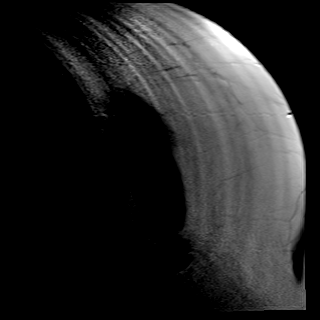
[im 4/26]
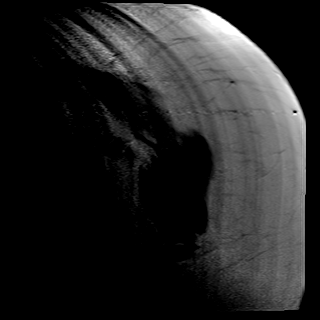
[im 7/26]
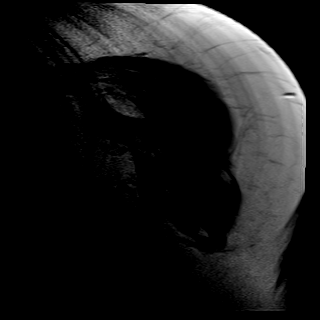
[im 10/26]
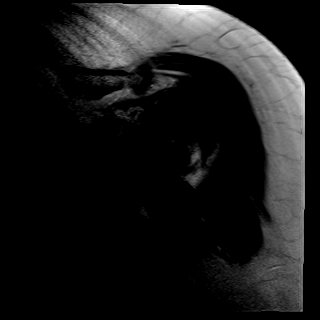
[im 13/26]
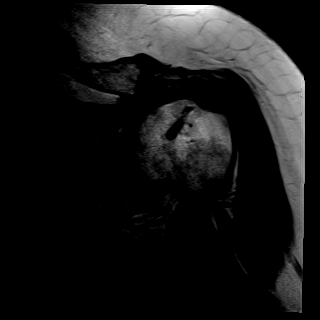
[im 16/26]
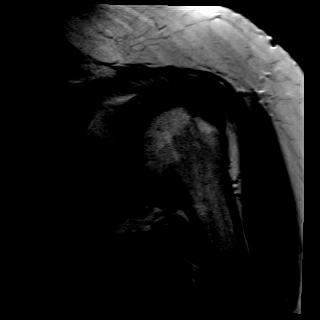
[im 19/26]
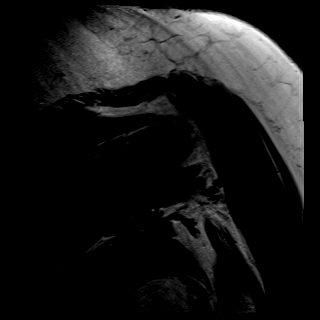
[im 22/26]
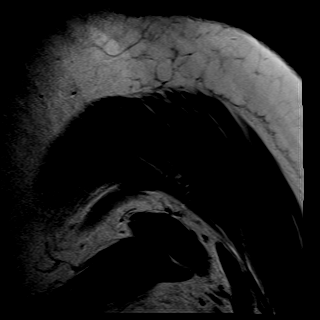
[im 26/26]
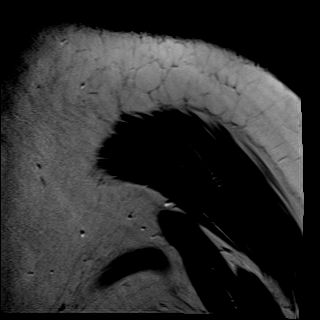

[Series 11: T2 fat-sat · oblique · left · 4.0mm · 0.44mm/px · 9 of 26 slices shown (2 of 3)]
[im 1/26]
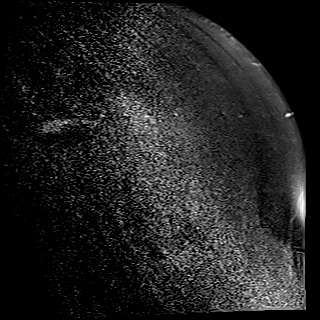
[im 4/26]
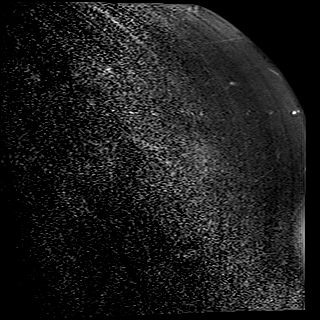
[im 7/26]
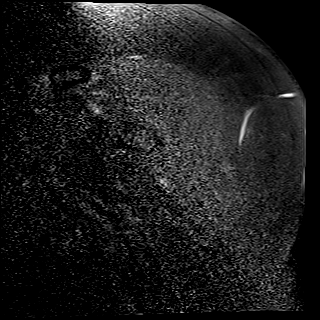
[im 10/26]
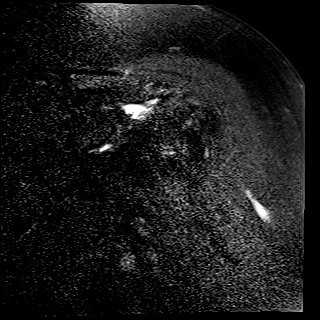
[im 13/26]
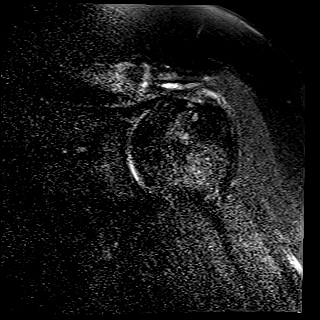
[im 16/26]
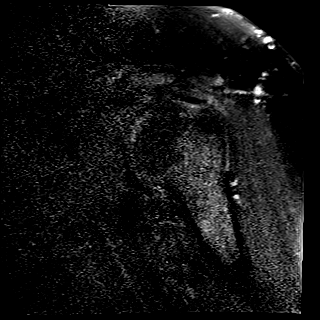
[im 19/26]
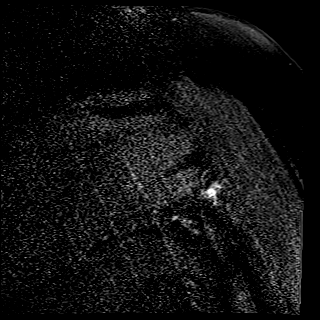
[im 22/26]
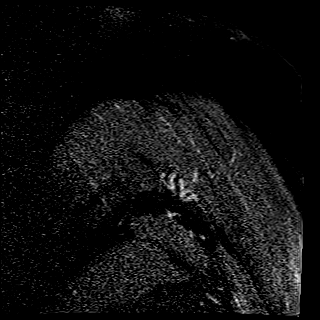
[im 26/26]
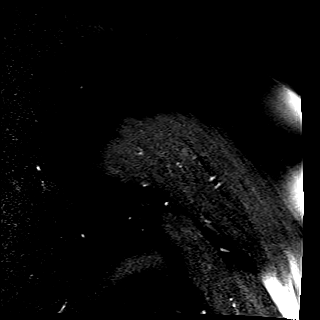

[Series 12: T2 fat-sat · oblique · left · 4.0mm · 0.22mm/px · 5 of 22 slices shown (3 of 3)]
[im 1/22]
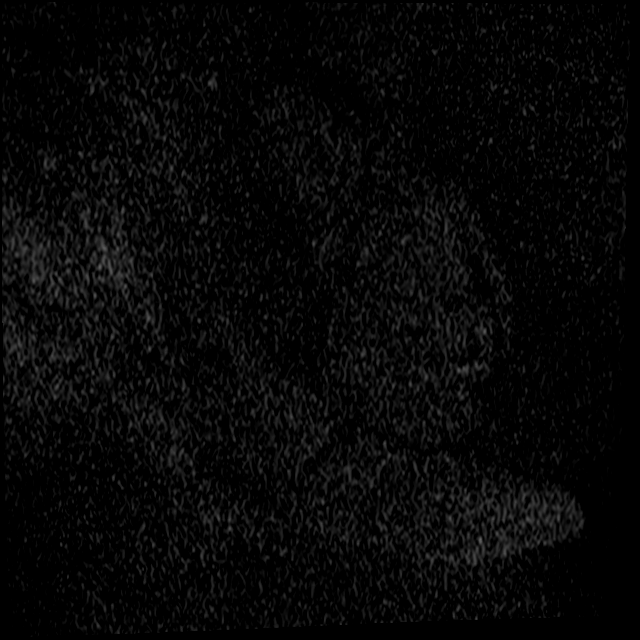
[im 4/22]
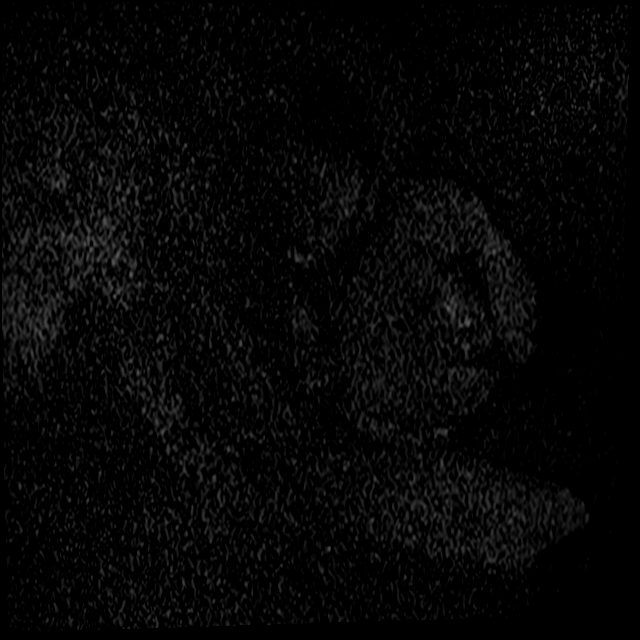
[im 8/22]
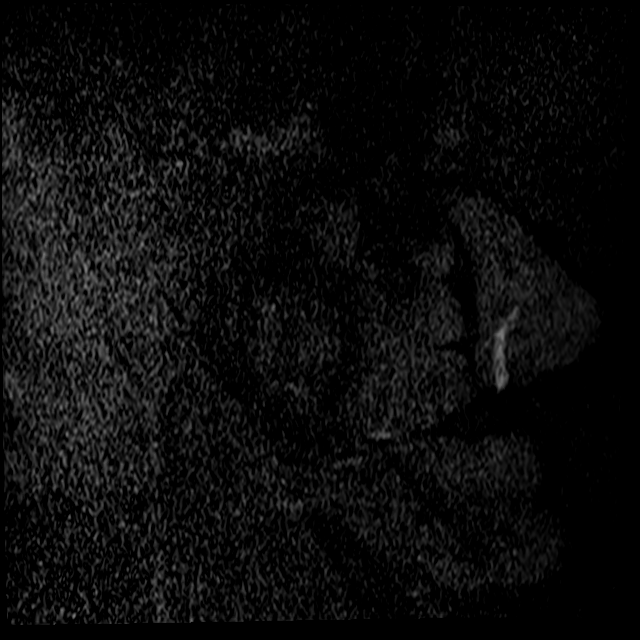
[im 11/22]
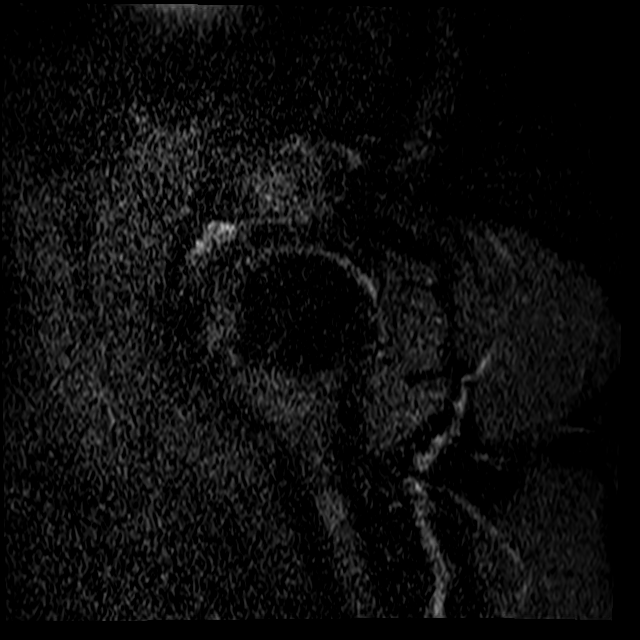
[im 18/22]
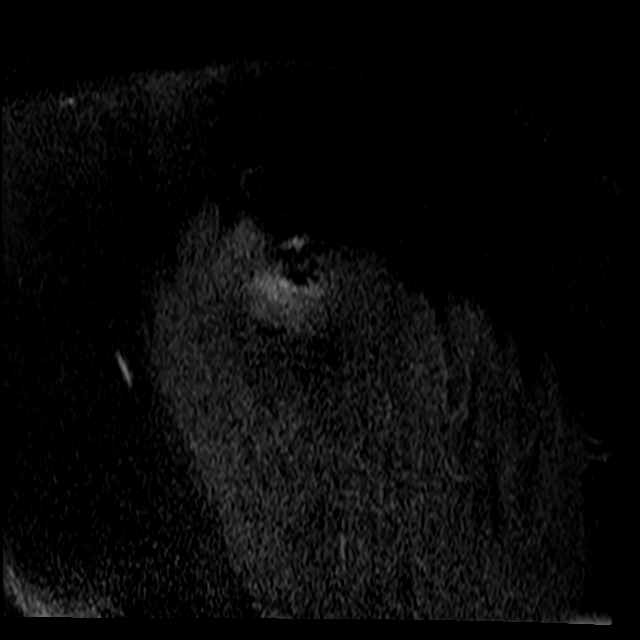

[31 of 40 positions shown; findings below may reference images not displayed]

FINDINGS: Rotator cuff: There are 3 screw anchors within the anterior aspect
of the greater tuberosity status post interval rotator cuff repair.
There is mild attenuation of the bursal aspect of the anterior
supraspinatus tendon fibers in this region possibly chronic baseline
changes following surgery. No supraspinatus tendon retraction is
seen. There is mild-to-moderate intermediate T2 signal supraspinatus
tendinosis. The infraspinatus subscapularis and teres minor are
intact.

Muscles:  Mild supraspinatus muscle atrophy.

Biceps long head: Within the limitations of decreased signal to
noise no gross biceps tendon abnormality is seen.

Acromioclavicular Joint: There are mild degenerative changes of the
acromioclavicular joint including joint space narrowing, subchondral
marrow edema, and peripheral osteophytosis. Type II acromion. Mild
subacromial/subdeltoid bursitis.

Glenohumeral Joint: Mild-to-moderate thinning of the glenoid and
humeral head cartilage.

Labrum: Mild degenerative irregularity of the posterior glenoid
labrum.

Bones:  No acute fracture.

Other: None.
IMPRESSION: :
IMPRESSION: 1. Compared to 09/07/2009, interval supraspinatus rotator cuff
repair. There is mild attenuation of the bursal sided supraspinatus
tendon fibers in the region of prior surgery. No full-thickness tear
is seen.
2. Mild supraspinatus muscle atrophy.
3. Mild degenerative changes of the acromioclavicular joint.

## 2022-09-12 ENCOUNTER — Encounter: Payer: Self-pay | Admitting: Podiatry

## 2022-09-12 ENCOUNTER — Ambulatory Visit (INDEPENDENT_AMBULATORY_CARE_PROVIDER_SITE_OTHER): Payer: Medicare Other | Admitting: Podiatry

## 2022-09-12 DIAGNOSIS — M722 Plantar fascial fibromatosis: Secondary | ICD-10-CM

## 2022-09-12 DIAGNOSIS — M778 Other enthesopathies, not elsewhere classified: Secondary | ICD-10-CM

## 2022-09-12 MED ORDER — CELECOXIB 200 MG PO CAPS: 200.0000 mg | ORAL_CAPSULE | Freq: Two times a day (BID) | ORAL | 3 refills | Status: DC

## 2022-09-12 MED ORDER — TRIAMCINOLONE ACETONIDE 40 MG/ML IJ SUSP
40.0000 mg | Freq: Once | INTRAMUSCULAR | Status: AC
  Administered 2022-09-12: 40 mg

## 2022-09-12 NOTE — Progress Notes (Signed)
She presents today after having not seen her for a couple of years with a chief complaint of pain to the dorsal and dorsal lateral aspect of her bilateral foot.  She states that got an injection by the other doctor but only lasted a couple days that she points to the dorsal aspect of the bilateral foot.  Objective: Vital signs stable alert oriented x 3.  There is been no erythema edema salines drainage odor she has pain on palpation medial calcaneal tubercles bilateral consistent with Planter fasciitis.  She has tenderness on palpation to the fourth fifth tarsometatarsal joint and the dorsal lateral aspect of the foot consistent with lateral compensatory syndrome associated with Planter fasciitis.  Assessment: Planter fasciitis lateral compensatory syndrome bilateral.  Plan: Reinjected the bilateral heels today she will start back on Celebrex 200 mg twice daily for 1 month we will follow-up with her in 4 to 6 weeks.

## 2022-10-17 ENCOUNTER — Ambulatory Visit: Payer: Medicare Other | Admitting: Podiatry

## 2022-10-20 IMAGING — US US PELVIS COMPLETE WITH TRANSVAGINAL
1 series · 15 of 25 positions shown · non-contrast
Comparison: None

CLINICAL DATA: Lower abdominal pain.  Previous C-section.

EXAM:
TRANSABDOMINAL AND TRANSVAGINAL ULTRASOUND OF PELVIS
TECHNIQUE: Both transabdominal and transvaginal ultrasound examinations of the
pelvis were performed. Transabdominal technique was performed for
global imaging of the pelvis including uterus, ovaries, adnexal
regions, and pelvic cul-de-sac. It was necessary to proceed with
endovaginal exam following the transabdominal exam to visualize the
ovaries and endometrium and better visualize the uterus.

[Series 1: us pelvis complete · 15 of 45 slices shown]
[im 1/45]
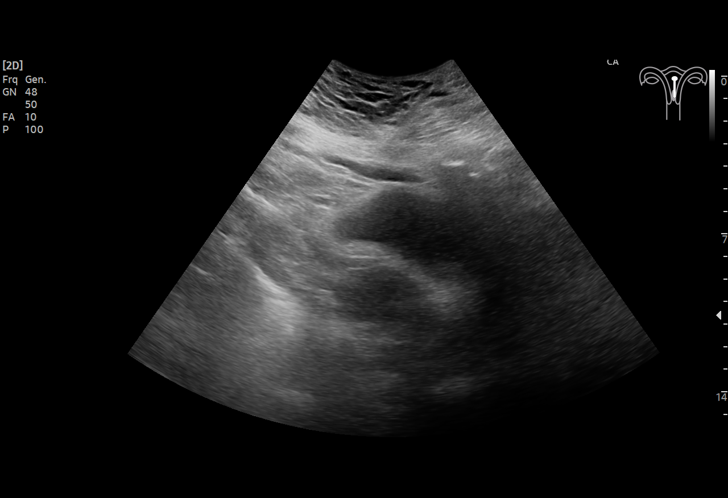
[im 4/45]
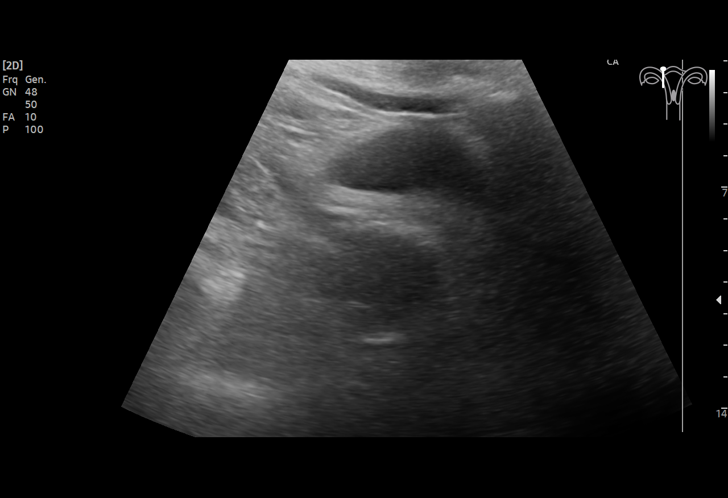
[im 8/45]
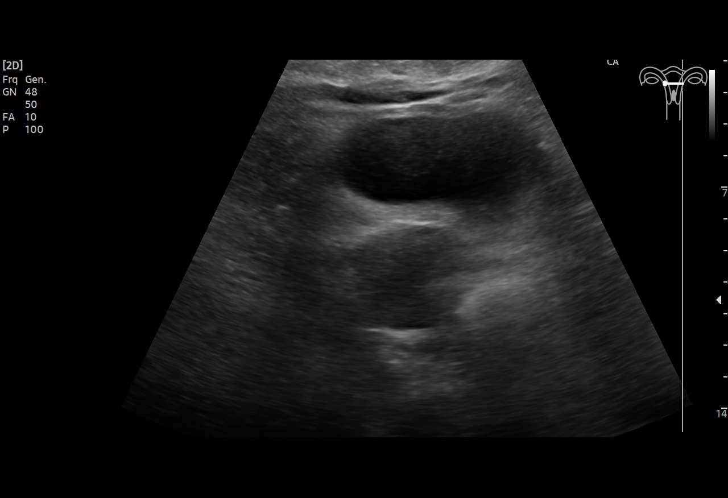
[im 10/45]
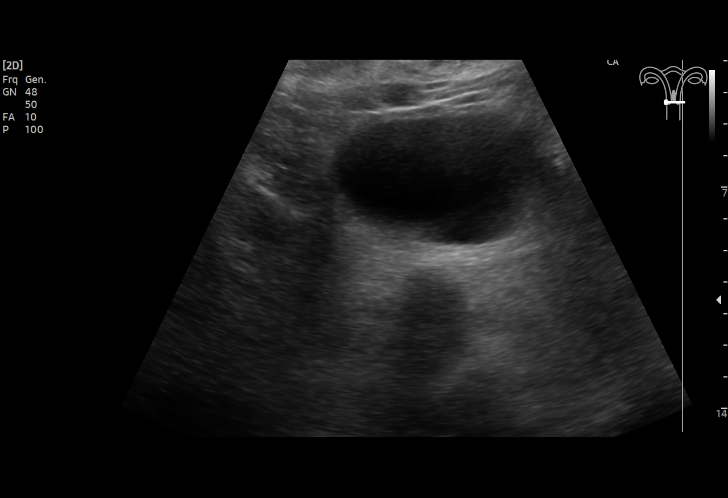
[im 13/45]
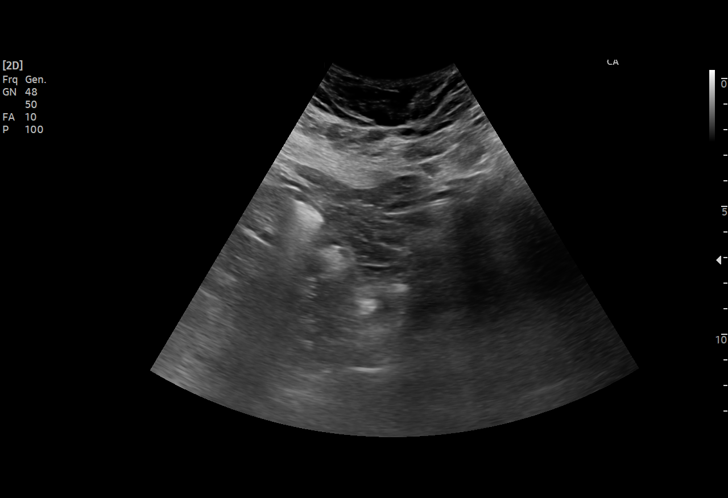
[im 17/45]
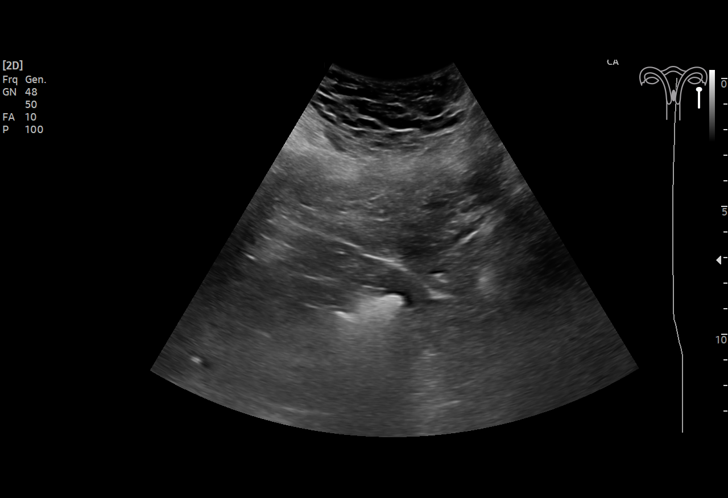
[im 19/45]
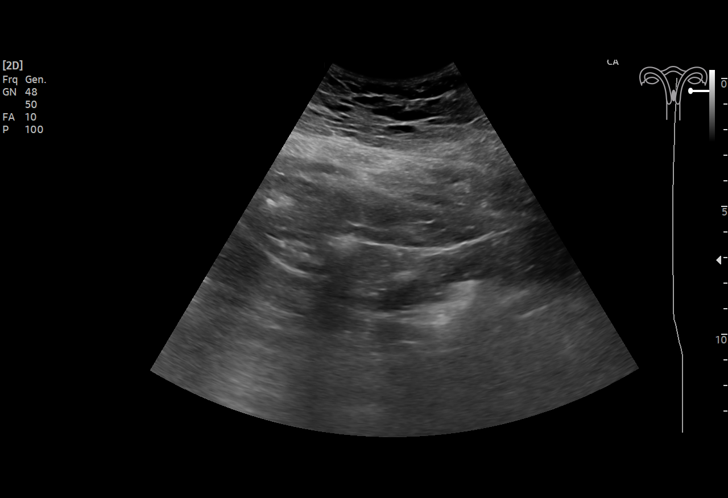
[im 23/45]
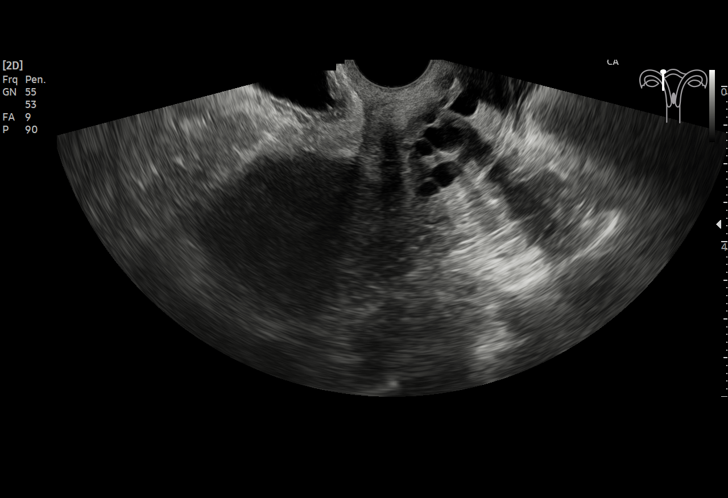
[im 26/45]
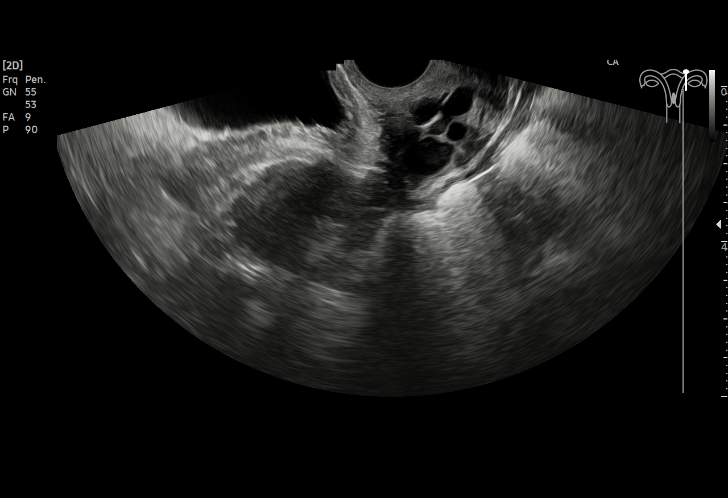
[im 28/45]
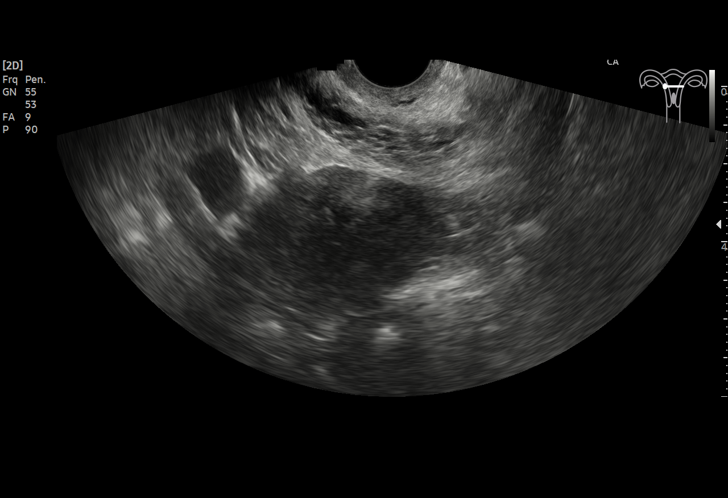
[im 32/45]
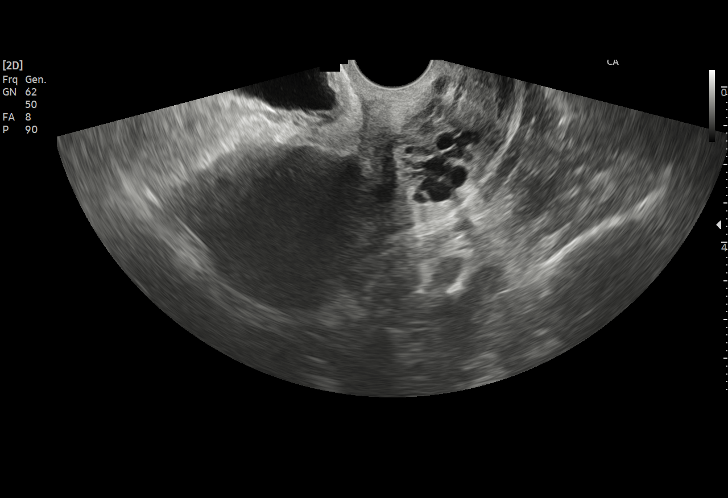
[im 35/45]
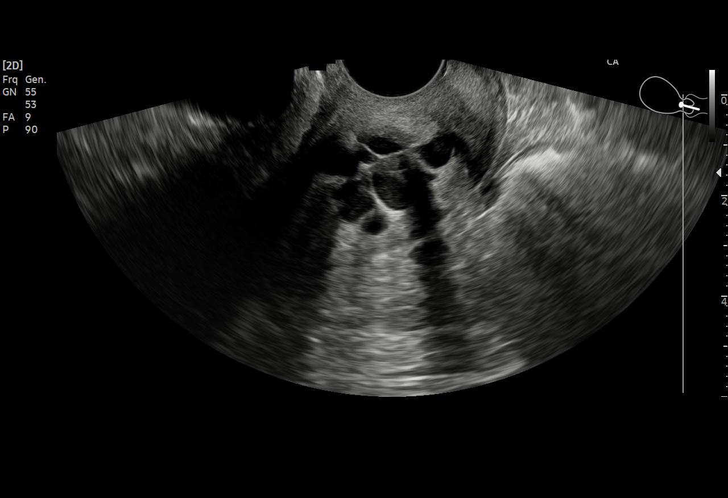
[im 37/45]
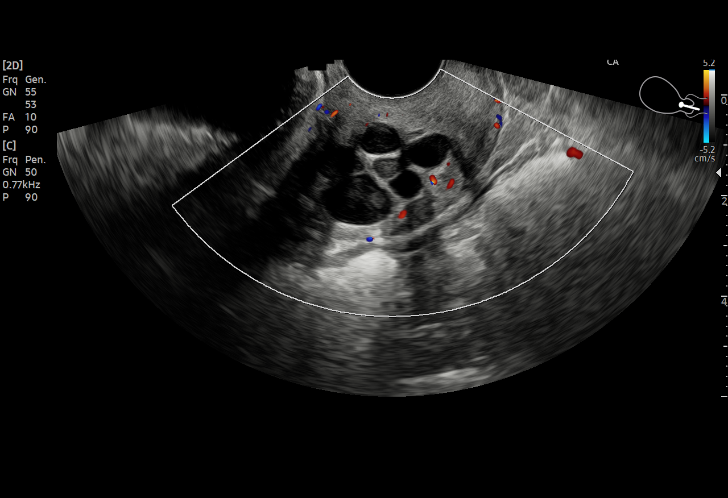
[im 41/45]
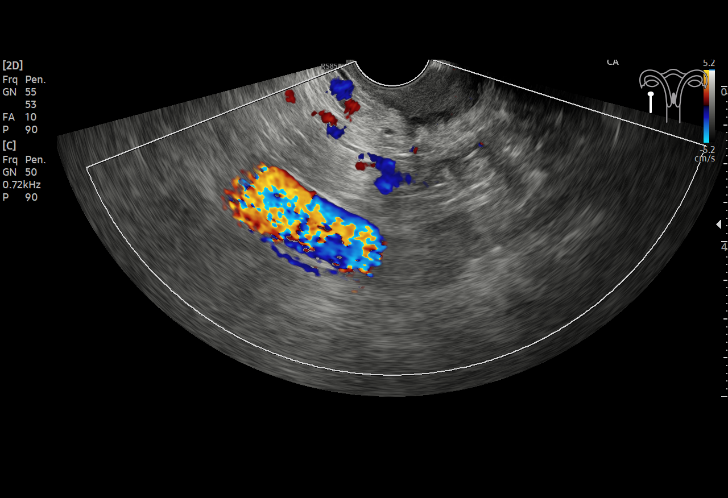
[im 45/45]
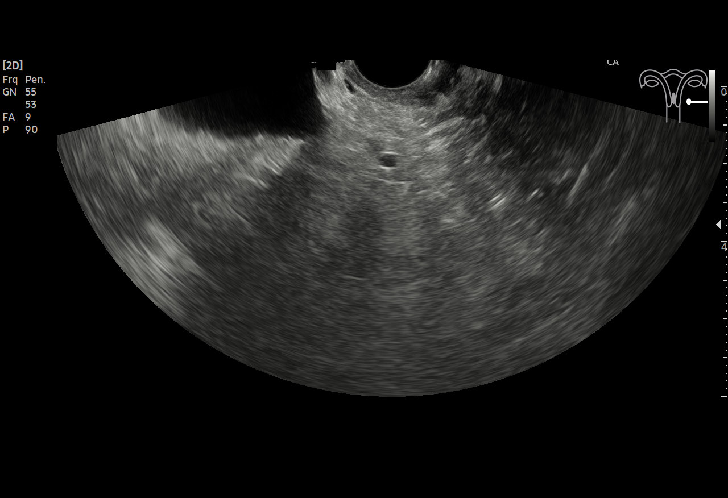

[15 of 25 positions shown; findings below may reference images not displayed]

FINDINGS: The examination was somewhat limited due to body habitus and
shadowing from the patient's C-section scar.

Uterus

Measurements: 9.4 x 5.4 x 4.2 cm = volume: 111 mL. Dense posterior
acoustical shadowing from the patient's C-section scar. No fibroids
or other mass visualized. Multiple nabothian cysts.

Endometrium

Not visualized.

Right ovary

Not visualized.

Left ovary

Not visualized.

Other findings

No abnormal free fluid.
IMPRESSION: Limited examination without visualization of the endometrium or
ovaries. Portions of the uterus were obscured by the patient's
C-section scar with no visible discrete uterine masses.

## 2022-10-24 ENCOUNTER — Ambulatory Visit: Payer: Medicare Other | Admitting: Podiatry

## 2022-12-03 ENCOUNTER — Ambulatory Visit (INDEPENDENT_AMBULATORY_CARE_PROVIDER_SITE_OTHER): Payer: Medicare Other | Admitting: Podiatry

## 2022-12-03 ENCOUNTER — Encounter: Payer: Self-pay | Admitting: Podiatry

## 2022-12-03 DIAGNOSIS — M778 Other enthesopathies, not elsewhere classified: Secondary | ICD-10-CM

## 2022-12-03 MED ORDER — TRIAMCINOLONE ACETONIDE 40 MG/ML IJ SUSP
40.0000 mg | Freq: Once | INTRAMUSCULAR | Status: AC
Start: 2022-12-03 — End: 2022-12-03
  Administered 2022-12-03: 40 mg

## 2022-12-03 NOTE — Progress Notes (Signed)
She presents today for follow-up of her plantar fasciitis bilateral states that the heels are doing okay she is as well as starting to have pain on the dorsal aspect of the foot.  Objective: Vital signs stable alert and oriented x 3 she has pain on palpation of the fourth fifth tarsometatarsal joint and on frontal plane range of motion.  Assessment: Capsulitis of the fourth fifth tarsometatarsal joint most likely associated with prior plantar fasciitis.  Plan: I injected the areas today with 10 mg Kenalog 5 mg Marcaine point maximal tenderness.  Tolerated procedure well follow-up with her in about 2 months if necessary.

## 2022-12-20 ENCOUNTER — Telehealth: Payer: Self-pay | Admitting: Podiatry

## 2022-12-20 NOTE — Telephone Encounter (Signed)
Patient saw you about a week ago and had an injection. Patient states she is having tingling now in her foot. Patient wants to know is that a reaction from the shot? [phone].

## 2023-01-28 ENCOUNTER — Ambulatory Visit: Payer: BC Managed Care – PPO | Admitting: Podiatry

## 2023-03-27 ENCOUNTER — Ambulatory Visit (INDEPENDENT_AMBULATORY_CARE_PROVIDER_SITE_OTHER): Payer: Medicare Other | Admitting: Podiatry

## 2023-03-27 ENCOUNTER — Encounter: Payer: Self-pay | Admitting: Podiatry

## 2023-03-27 DIAGNOSIS — M7751 Other enthesopathy of right foot: Secondary | ICD-10-CM | POA: Diagnosis not present

## 2023-03-27 DIAGNOSIS — M7752 Other enthesopathy of left foot: Secondary | ICD-10-CM

## 2023-03-27 MED ORDER — TRIAMCINOLONE ACETONIDE 40 MG/ML IJ SUSP
40.0000 mg | Freq: Once | INTRAMUSCULAR | Status: AC
Start: 2023-03-27 — End: 2023-03-27
  Administered 2023-03-27: 40 mg

## 2023-03-27 NOTE — Progress Notes (Signed)
She presents today for follow-up of her capsulitis bilateral midfoot.  She is complaining today that hurts right here as she points to her ankle joint.  She says both feet hurt they hurt anytime of walking.  Objective: Vital signs are stable she is alert oriented x 3.  She has pain on inversion and eversion of the subtalar joint as well as pain on direct palpation of the sinus tarsi.  She has less pain today on palpation of the fourth fifth tarsometatarsal joint which was previously symptomatic.  Assessment: Subtalar joint capsulitis pes planovalgus to lesser degree fourth fifth tarsometatarsal capsulitis.  Plan: Discussed etiology pathology conservative surgical therapies at this point I injected sinus tarsi Abstral Betadine skin prep with 20 mg Kenalog 5 mg Marcaine to the point of maximal tenderness.  Tolerated procedure well without complications I will follow-up with her in mid January.

## 2023-04-20 ENCOUNTER — Other Ambulatory Visit: Payer: Self-pay | Admitting: Podiatry

## 2023-05-08 ENCOUNTER — Ambulatory Visit: Payer: BC Managed Care – PPO | Admitting: Podiatry

## 2023-05-14 ENCOUNTER — Telehealth: Payer: Self-pay

## 2023-05-14 NOTE — Telephone Encounter (Signed)
The patient called in wanting to schedule her appointment from her new referral. I told her I only see the old referral from 6 month ago I can schedule her from that one so said no she want me to use the new. She said she will call her PCP back.

## 2023-07-24 ENCOUNTER — Ambulatory Visit (INDEPENDENT_AMBULATORY_CARE_PROVIDER_SITE_OTHER): Admitting: Podiatry

## 2023-07-24 ENCOUNTER — Encounter: Payer: Self-pay | Admitting: Podiatry

## 2023-07-24 DIAGNOSIS — M7751 Other enthesopathy of right foot: Secondary | ICD-10-CM

## 2023-07-24 MED ORDER — TRIAMCINOLONE ACETONIDE 40 MG/ML IJ SUSP
20.0000 mg | Freq: Once | INTRAMUSCULAR | Status: AC
  Administered 2023-07-24: 20 mg

## 2023-07-24 NOTE — Progress Notes (Signed)
 She presents today chief concern of pain to the dorsal lateral aspect of the right foot.  She states that has been sore and painful for quite some time now.  She says that it hurts even in bed.  She goes on to say that she is not able to wear her most recent orthotics because it feels like it is too high in the arch.  Objective: Vital signs are stable alert and oriented x 3 she has pain on palpation of the sinus tarsi of the right foot.  She has pain on palpation of inversion and eversion maximally of the subtalar joint.  There is no crepitation noted.  Assessment: Subtalar joint capsulitis right.  Plan: After sterile Betadine skin prep I injected the subtalar joint through the sinus tarsi with 20 mg of Kenalog 5 mg Marcaine.  I will take her orthotics to [facility] tomorrow to see if there is any way that we can change the height of the arch.  We will notify her once that is taken care of.

## 2023-09-04 ENCOUNTER — Ambulatory Visit (INDEPENDENT_AMBULATORY_CARE_PROVIDER_SITE_OTHER): Admitting: Podiatry

## 2023-09-04 ENCOUNTER — Encounter: Payer: Self-pay | Admitting: Podiatry

## 2023-09-04 DIAGNOSIS — M722 Plantar fascial fibromatosis: Secondary | ICD-10-CM | POA: Diagnosis not present

## 2023-09-04 DIAGNOSIS — M7751 Other enthesopathy of right foot: Secondary | ICD-10-CM | POA: Diagnosis not present

## 2023-09-04 NOTE — Progress Notes (Signed)
 Paula Flores presents today states that at her subtalar joint injection last visit made her foot better.  She is wondering if her orthotics are back.  She states that she has a painful hallux nail plate also on the right foot.  She states that she may have to have help removing it at some point.  Objective: Vital signs stable alert oriented x 3 she has no tenderness on entering the subtalar joint right.  Mild tenderness on palpation of the sinus tarsi.  Hallux nail plate right does demonstrate a thick yellow dystrophic clinically mycotic nail that is painful for her.  Assessment: Subtalar joint capsulitis resolving.  Nail dystrophy hallux right.  Plan: Discussed etiology pathology conservative surgical therapies offered her injection today she states that she feels that is doing well enough and does not need an injection.  I was able to provide her with her orthotics which have been corrected.  Will follow-up with her in the near future for nail plate removal or injection of the subtalar joint.

## 2023-10-02 ENCOUNTER — Encounter: Payer: Self-pay | Admitting: Podiatry

## 2023-10-02 ENCOUNTER — Ambulatory Visit (INDEPENDENT_AMBULATORY_CARE_PROVIDER_SITE_OTHER): Admitting: Podiatry

## 2023-10-02 DIAGNOSIS — L600 Ingrowing nail: Secondary | ICD-10-CM | POA: Diagnosis not present

## 2023-10-02 MED ORDER — NEOMYCIN-POLYMYXIN-HC 1 % OT SOLN: OTIC | 1 refills | Status: DC

## 2023-10-02 NOTE — Progress Notes (Signed)
 Paula Flores presents today states that everything else is feeling better but only get this toenail taken off as she refers to the hallux nail plate right.  She had immediate done many years ago and it just never has gone away completely.  Objective: Vital signs are stable oriented x 3.  There is no erythema edema cellulitis drainage or odor pulses are strongly palpable.  Discolored painful dystrophic nail hallux right.  Assessment: Pain limb secondary to onychomycosis and nail dystrophy.  Diabetic peripheral neuropathy.  Plan: We performed a total matrixectomy today after local anesthetic was administered to the hallux right.  Tolerated the procedure well without complications.  Was given both oral written 1 instruction for the care and soaking of the toe.  Prescription for Cortisporin Otic was sent to her pharmacy which will be applied twice daily after soaking.

## 2023-10-02 NOTE — Patient Instructions (Signed)

## 2023-10-16 ENCOUNTER — Encounter: Payer: Self-pay | Admitting: Podiatry

## 2023-10-16 ENCOUNTER — Ambulatory Visit (INDEPENDENT_AMBULATORY_CARE_PROVIDER_SITE_OTHER): Admitting: Podiatry

## 2023-10-16 DIAGNOSIS — L600 Ingrowing nail: Secondary | ICD-10-CM

## 2023-10-16 DIAGNOSIS — Z9889 Other specified postprocedural states: Secondary | ICD-10-CM

## 2023-10-16 NOTE — Progress Notes (Signed)
 Paula Flores presents today for follow-up of her matrixectomy hallux right.  She says the toe feels fine no problems she is no longer soaking it.  Objective: Vital signs stable alert oriented x 3 there is no erythema edema salines drainage odor there is some scab present but appears to be healing very nicely is nontender to touch.  Assessment: Well-healing surgical toe.  Plan: Follow-up with me on an as-needed basis.

## 2023-11-13 ENCOUNTER — Telehealth: Payer: Self-pay | Admitting: Podiatry

## 2023-11-13 NOTE — Telephone Encounter (Signed)
 Patient had dropped off some disability forms for Dr Verta to fill out. Forms given to Raquel. Raquel had some questions about disability. I told Raquel I would call the patient and follow up about the disability if she is looking for full or short term and if she is working now. Called pt she states she is out on disability now since 2021. Dr Edie from [facility] will not fill out her disability forms because she hasn't been there for over a year. Patient was wondering if Dr Verta could fill them out. I spoke with Rosina and I called pt and let her know she would need to call Dr Edie and follow up with him her last couple of visits here she has been feeling better.

## 2023-11-13 NOTE — Telephone Encounter (Signed)
 Sent note to Dr. Verta to confirm her progress. We have disability forms for her.

## 2023-11-27 ENCOUNTER — Other Ambulatory Visit: Payer: Self-pay | Admitting: Physical Medicine & Rehabilitation

## 2023-11-27 ENCOUNTER — Ambulatory Visit: Admitting: Podiatry

## 2023-11-27 ENCOUNTER — Ambulatory Visit (INDEPENDENT_AMBULATORY_CARE_PROVIDER_SITE_OTHER)

## 2023-11-27 DIAGNOSIS — M778 Other enthesopathies, not elsewhere classified: Secondary | ICD-10-CM | POA: Diagnosis not present

## 2023-11-27 DIAGNOSIS — M65971 Unspecified synovitis and tenosynovitis, right ankle and foot: Secondary | ICD-10-CM

## 2023-11-27 DIAGNOSIS — M65972 Unspecified synovitis and tenosynovitis, left ankle and foot: Secondary | ICD-10-CM

## 2023-11-27 DIAGNOSIS — M542 Cervicalgia: Secondary | ICD-10-CM

## 2023-11-27 NOTE — Progress Notes (Signed)
 Paula Flores presents today with painful feet.  States that my feet hurt all the time I am barely able to be on them for more than a few minutes at a time and they hurt right in here.  She states that she is not able to work full-time at least in the standing or walking job.  Objective: Vital signs are stable alert oriented x 3.  Pulses are palpable.  She has a history of bilateral endoscopic plantar fasciotomies and surgery to the feet that have left her with pes planovalgus and pain on palpation of the sinus tarsi with subtalar joint capsulitis.  We have tried to correct this with orthotics good shoes and no bare feet flip-flops or sandals.  This is failed to render her asymptomatic.  She still has pain today on palpation of the sinus tarsi and on end range of the subtalar joint.  She also has pain on frontal plane range of motion of the tarsometatarsal joints and on palpation of these joints.  Radiographs taken today demonstrate osseously mature individual with good bone mineralization.  Significant pes planovalgus bilateral with very large plantar distally oriented calcaneal heel spurs with calcification within the plantar fascia and musculature.  She also has significant osteoarthritic spurring of the tarsometatarsal joints bilaterally.  There is joint space narrowing subchondral sclerosis and dorsal eburnation.  Patient ambulates with an antalgic gait.  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  Soft tissue swelling and impingement of the subtalar joint and sinus tarsi.  Plantar calcaneal heel spurs.  Plan: Discussed etiology pathology conservative surgical therapies I expressed to her that I am not sure that she needs to have a job where she stands all the time.  I expressed to her that conservative therapies including aggressive therapies such as surgery have failed to render her asymptomatic.  I expressed to her that most likely she needs a sitdown job or a  disability of some sort.  I also recommended injecting the subtalar joints today and discussed appropriate shoe gear for ambulation inside her home.  I injected subtalar joint today with 10 mg Kenalog  5 mg Marcaine  point maximal tenderness after sterile Betadine skin prep.

## 2023-11-28 ENCOUNTER — Telehealth: Payer: Self-pay | Admitting: Podiatry

## 2023-11-28 NOTE — Telephone Encounter (Signed)
 See notes

## 2023-11-28 NOTE — Telephone Encounter (Signed)
 See prev notes. Pt has been told that Dr. Verta will not do Perm disability. Forms were given again at [facility] office.

## 2023-11-28 NOTE — Telephone Encounter (Signed)
 Called patient and let her know per Dr Verta Edwardo P and Raquel )we can not fill out forms for total disability.

## 2023-12-04 ENCOUNTER — Ambulatory Visit
Admission: RE | Admit: 2023-12-04 | Discharge: 2023-12-04 | Disposition: A | Discharge status: Home / Self Care | Source: Ambulatory Visit | Attending: Physical Medicine & Rehabilitation | Admitting: Physical Medicine & Rehabilitation

## 2023-12-04 DIAGNOSIS — M542 Cervicalgia: Secondary | ICD-10-CM

## 2024-01-28 ENCOUNTER — Telehealth: Payer: Self-pay | Admitting: Lab

## 2024-01-28 NOTE — Telephone Encounter (Signed)
 Patient would like some patches for pain states is having pain.

## 2024-01-29 NOTE — Progress Notes (Deleted)
 Referring Physician:  Weyman Bright, MD [address] [place],  [place] [address]  Primary Physician:  Weyman Bright, MD  History of Present Illness: 01/29/2024 Paula Flores is here today with a chief complaint of ***  Neck pain that radiates down the left arm to the elbow.  Duration: *** Location: *** Quality: *** Severity: ***  Precipitating: aggravated by *** Modifying factors: made better by *** Weakness: none Timing: *** Bowel/Bladder Dysfunction: none  Conservative measures:  Physical therapy: *** has not participated in Multimodal medical therapy including regular antiinflammatories: *** Tylenol , Celebrex , Gabapentin, Hydrocodone , Lidocaine  patch Injections: 11/14/2023 C5-6 TFESI  Past Surgery: ***none  Paula Flores has ***no symptoms of cervical myelopathy.  The symptoms are causing a significant impact on the patient's life.   I have utilized the care everywhere function in epic to review the outside records available from external health systems.  Review of Systems:  A 10 point review of systems is negative, except for the pertinent positives and negatives detailed in the HPI.  Past Medical History: Past Medical History:  Diagnosis Date   Anemia    Arthritis    Bacterial infection due to H. pylori    COVID-19 virus detected 6/13/20230   Diabetes mellitus without complication (HCC)    Hypertension    Lab test positive for detection of COVID-19 virus 10/16/2018   Mastitis 2014    Past Surgical History: Past Surgical History:  Procedure Laterality Date   ABLATION ON ENDOMETRIOSIS  09/04/2011   BOWEL RESECTION  1994   BREAST BIOPSY  11/03/2014   CESAREAN SECTION  1988   CHOLECYSTECTOMY  1994   had bowel resection   COLONOSCOPY WITH PROPOFOL  N/A 11/24/2019   Procedure: COLONOSCOPY WITH PROPOFOL ;  Surgeon: Janalyn Keene NOVAK, MD;  Location: [facility] END[facility];  Service: Endoscopy;  Laterality: N/A;   FOOT SURGERY Right 2014    HYSTEROSCOPY WITH D & C N/A 10/13/2015   Procedure: DILATATION AND CURETTAGE /HYSTEROSCOPY;  Surgeon: Glory High, MD;  Location: [facility] ORS;  Service: Gynecology;  Laterality: N/A;   SHOULDER ARTHROSCOPY WITH OPEN ROTATOR CUFF REPAIR Left 01/19/2020   Procedure: LEFT SHOULDER ARTHROSCOPY WITH DEBRIDEMENT, DECOMPRESSION, POSSIBLE ROTATOR CUFF REPAIR, AND POSSIBLE BICEPS TENODESIS;  Surgeon: Edie Norleen PARAS, MD;  Location: [facility] ORS;  Service: Orthopedics;  Laterality: Left;   TONSILLECTOMY     TONSILLECTOMY  2008   TUBAL LIGATION  1994    Allergies: Allergies as of 02/05/2024 - Review Complete 10/16/2023  Allergen Reaction Noted   Penicillins Rash 07/23/2012    Medications:  Current Outpatient Medications:    acetaminophen  (TYLENOL ) 500 MG tablet, Take 500 mg by mouth every 6 (six) hours as needed., Disp: , Rfl:    amLODipine (NORVASC) 5 MG tablet, Take 5 mg by mouth daily., Disp: , Rfl:    atorvastatin (LIPITOR) 10 MG tablet, Take by mouth., Disp: , Rfl:    celecoxib  (CELEBREX ) 200 MG capsule, TAKE 1 CAPSULE BY MOUTH TWICE A DAY, Disp: 60 capsule, Rfl: 3   cetirizine (ZYRTEC) 10 MG tablet, Take 10 mg by mouth daily., Disp: , Rfl:    chlorthalidone (HYGROTON) 25 MG tablet, Take 25 mg by mouth daily., Disp: , Rfl:    clotrimazole-betamethasone (LOTRISONE) cream, Apply 1 Application topically 4 (four) times daily as needed., Disp: , Rfl:    D3-50 1.25 MG (50000 UT) capsule, Take 50,000 Units by mouth every Thursday. , Disp: , Rfl:    diclofenac Sodium (VOLTAREN) 1 % GEL, Apply 2 g  topically 4 (four) times daily., Disp: , Rfl:    etodolac  (LODINE ) 400 MG tablet, Take 400 mg by mouth 2 (two) times daily as needed for mild pain. , Disp: , Rfl:    gabapentin (NEURONTIN) 300 MG capsule, Take 300 mg by mouth 3 (three) times daily., Disp: , Rfl:    glipiZIDE  (GLUCOTROL  XL) 5 MG 24 hr tablet, Take 2 tablets (10 mg total) by mouth daily. (Patient taking differently: Take 5 mg by mouth 2 (two) times  daily.), Disp: , Rfl:    HYDROcodone -acetaminophen  (NORCO/VICODIN) 5-325 MG tablet, Take 1 tablet by mouth every 6 (six) hours as needed for moderate pain., Disp: 30 tablet, Rfl: 0   ipratropium (ATROVENT) 0.06 % nasal spray, Place 2 sprays into the nose 3 (three) times daily., Disp: , Rfl:    lidocaine  (LIDODERM ) 5 %, Place 1 patch onto the skin daily., Disp: , Rfl:    losartan (COZAAR) 100 MG tablet, Take 100 mg by mouth daily., Disp: , Rfl:    metaxalone (SKELAXIN) 800 MG tablet, Take 800 mg by mouth 3 (three) times daily., Disp: , Rfl:    metFORMIN  (GLUCOPHAGE ) 1000 MG tablet, Take 1,000 mg by mouth 2 (two) times daily., Disp: , Rfl:    metoprolol tartrate (LOPRESSOR) 50 MG tablet, Take 50 mg by mouth 2 (two) times daily., Disp: , Rfl:    NEOMYCIN -POLYMYXIN-HYDROCORTISONE (CORTISPORIN) 1 % SOLN OTIC solution, Apply 1-2 drops to toe BID after soaking, Disp: 10 mL, Rfl: 1   ondansetron  (ZOFRAN ) 4 MG tablet, Take 4 mg by mouth every 8 (eight) hours as needed., Disp: , Rfl:    OZEMPIC, 0.25 OR 0.5 MG/DOSE, 2 MG/3ML SOPN, Inject into the skin., Disp: , Rfl:    rosuvastatin (CRESTOR) 10 MG tablet, Take 10 mg by mouth daily., Disp: , Rfl:   Social History: Social History   Tobacco Use   Smoking status: Never   Smokeless tobacco: Never  Vaping Use   Vaping status: Never Used  Substance Use Topics   Alcohol use: Yes    Comment: OCCASIONALLY   Drug use: No    Family Medical History: Family History  Problem Relation Age of Onset   Cervical cancer Maternal Grandmother     Physical Examination: There were no vitals filed for this visit.  General: Patient is in no apparent distress. Attention to examination is appropriate.  Neck:   Supple.  Full range of motion.  Respiratory: Patient is breathing without any difficulty.   NEUROLOGICAL:     Awake, alert, oriented to person, place, and time.  Speech is clear and fluent.   Cranial Nerves: Pupils equal round and reactive to light.   Facial tone is symmetric.  Facial sensation is symmetric. Shoulder shrug is symmetric. Tongue protrusion is midline.    Strength: Side Biceps Triceps Deltoid Interossei Grip Wrist Ext. Wrist Flex.  R 5 5 5 5 5 5 5   L 5 5 5 5 5 5 5    Side Iliopsoas Quads Hamstring PF DF EHL  R 5 5 5 5 5 5   L 5 5 5 5 5 5    Reflexes are ***2+ and symmetric at the biceps, triceps, brachioradialis, patella and achilles.   Hoffman's is absent. Clonus is absent  Bilateral upper and lower extremity sensation is intact to light touch ***.     No evidence of dysmetria noted.  Gait is normal.    Imaging: *** I have personally reviewed the images and agree with the above interpretation.  Medical Decision Making/Assessment and Plan: Ms. Kealey is a pleasant 58 y.o. female with ***  There are no diagnoses linked to this encounter.   Thank you for involving me in the care of this patient.    Penne MICAEL Sharps MD/MSCR Neurosurgery

## 2024-01-30 ENCOUNTER — Other Ambulatory Visit: Payer: Self-pay | Admitting: Lab

## 2024-01-30 MED ORDER — LIDOCAINE 5 % EX PTCH: 1.0000 | MEDICATED_PATCH | CUTANEOUS | 0 refills | Status: DC

## 2024-01-30 NOTE — Telephone Encounter (Signed)
 Prescription sent

## 2024-01-31 ENCOUNTER — Telehealth: Payer: Self-pay

## 2024-01-31 NOTE — Telephone Encounter (Signed)
 PA request received for Lidocaine  5% patches and waiting on response.   Paula Flores  (Key: [id number]) Rx #: [id number]

## 2024-02-05 ENCOUNTER — Ambulatory Visit: Admitting: Neurosurgery

## 2024-02-05 ENCOUNTER — Ambulatory Visit (INDEPENDENT_AMBULATORY_CARE_PROVIDER_SITE_OTHER): Admitting: Podiatry

## 2024-02-05 DIAGNOSIS — M19071 Primary osteoarthritis, right ankle and foot: Secondary | ICD-10-CM | POA: Diagnosis not present

## 2024-02-05 DIAGNOSIS — M19072 Primary osteoarthritis, left ankle and foot: Secondary | ICD-10-CM

## 2024-02-05 MED ORDER — TRIAMCINOLONE ACETONIDE 40 MG/ML IJ SUSP: 20.0000 mg | Freq: Once | INTRAMUSCULAR | Status: AC

## 2024-02-05 NOTE — Progress Notes (Signed)
 She presents today chief complaint of painful top of her feet particularly after she has been wearing dress shoes for her [place].  Objective: Vital signs are stable she is alert and oriented x 3 she has no pain on palpation medial calcaneal tubercles bilaterally she does have pain on palpation of the sinus tarsi and pain on end range of motion of the subtalar joint.  Assessment: Subtalar joint capsulitis bilateral.  Plan: I injected the sinus tarsi into the subtalar joint today 20 mg of Kenalog  5 mg Marcaine  plain maximal tenderness.  Tolerated the procedure well.  Follow-up with her in the near future.

## 2024-02-10 ENCOUNTER — Ambulatory Visit: Admitting: Podiatry

## 2024-02-18 NOTE — Progress Notes (Signed)
 Referring Physician:  Weyman Bright, MD [address] [place],  [place] [address]  Primary Physician:  Weyman Bright, MD  History of Present Illness: 02/26/2024 Discussed the use of AI scribe software for clinical note transcription with the patient, who gave verbal consent to proceed.  History of Present Illness Paula Flores is a 58 year old female who presents with chronic neck pain radiating to the shoulder and arm. She was referred by Doctor Dodson for evaluation of chronic neck pain and consideration of surgical options.  Chronic neck pain is primarily on the left side, radiating into the shoulder and causing occasional cramping sensations in the arm. The pain has persisted since a fall on ice in 2018 and is not exacerbated by neck movements. Previous shoulder surgery improved shoulder condition but did not alleviate neck pain. Physical therapy provided some relief but did not resolve the pain completely. Injections offered temporary relief for a few months. An MRI from August 13th shows disc protrusions at C5-C6 and C6-C7.  She experiences weakness in the left shoulder and bicep, with notable nerve pain and a feeling of tiredness in the arm. There is no worsening of pain with neck movement.  Conservative measures:  Physical therapy:  Has  participated in [facility] . Multimodal medical therapy including regular antiinflammatories:  Tylenol , Celebrex , Gabapentin, Hydrocodone , Lidocaine  patch Injections: 11/14/2023 C5-6 TFESI  The symptoms are causing a significant impact on the patient's life.   I have utilized the care everywhere function in epic to review the outside records available from external health systems.  Review of Systems:  A 10 point review of systems is negative, except for the pertinent positives and negatives detailed in the HPI.  Past Medical History: Past Medical History:  Diagnosis Date   Anemia    Arthritis    Bacterial infection due to H. pylori     COVID-19 virus detected 6/13/20230   Diabetes mellitus without complication (HCC)    Hypertension    Lab test positive for detection of COVID-19 virus 10/16/2018   Mastitis 2014    Past Surgical History: Past Surgical History:  Procedure Laterality Date   ABLATION ON ENDOMETRIOSIS  09/04/2011   BOWEL RESECTION  1994   BREAST BIOPSY  11/03/2014   CESAREAN SECTION  1988   CHOLECYSTECTOMY  1994   had bowel resection   COLONOSCOPY WITH PROPOFOL  N/A 11/24/2019   Procedure: COLONOSCOPY WITH PROPOFOL ;  Surgeon: Janalyn Keene NOVAK, MD;  Location: [facility];  Service: Endoscopy;  Laterality: N/A;   FOOT SURGERY Right 2014   HYSTEROSCOPY WITH D & C N/A 10/13/2015   Procedure: DILATATION AND CURETTAGE /HYSTEROSCOPY;  Surgeon: Glory High, MD;  Location: [facility];  Service: Gynecology;  Laterality: N/A;   SHOULDER ARTHROSCOPY WITH OPEN ROTATOR CUFF REPAIR Left 01/19/2020   Procedure: LEFT SHOULDER ARTHROSCOPY WITH DEBRIDEMENT, DECOMPRESSION, POSSIBLE ROTATOR CUFF REPAIR, AND POSSIBLE BICEPS TENODESIS;  Surgeon: Edie Norleen PARAS, MD;  Location: [facility];  Service: Orthopedics;  Laterality: Left;   TONSILLECTOMY     TONSILLECTOMY  2008   TUBAL LIGATION  1994    Allergies: Allergies as of 02/05/2024 - Review Complete 10/16/2023  Allergen Reaction Noted   Penicillins Rash 07/23/2012    Medications:  Current Outpatient Medications:    acetaminophen  (TYLENOL ) 500 MG tablet, Take 500 mg by mouth every 6 (six) hours as needed., Disp: , Rfl:    amLODipine (NORVASC) 5 MG tablet, Take 5 mg by mouth daily., Disp: , Rfl:    atorvastatin (LIPITOR)  10 MG tablet, Take by mouth., Disp: , Rfl:    celecoxib  (CELEBREX ) 200 MG capsule, TAKE 1 CAPSULE BY MOUTH TWICE A DAY, Disp: 60 capsule, Rfl: 3   cetirizine (ZYRTEC) 10 MG tablet, Take 10 mg by mouth daily., Disp: , Rfl:    chlorthalidone (HYGROTON) 25 MG tablet, Take 25 mg by mouth daily., Disp: , Rfl:    clotrimazole-betamethasone (LOTRISONE)  cream, Apply 1 Application topically 4 (four) times daily as needed., Disp: , Rfl:    D3-50 1.25 MG (50000 UT) capsule, Take 50,000 Units by mouth every Thursday. , Disp: , Rfl:    diclofenac Sodium (VOLTAREN) 1 % GEL, Apply 2 g topically 4 (four) times daily., Disp: , Rfl:    etodolac  (LODINE ) 400 MG tablet, Take 400 mg by mouth 2 (two) times daily as needed for mild pain. , Disp: , Rfl:    gabapentin (NEURONTIN) 300 MG capsule, Take 300 mg by mouth 3 (three) times daily., Disp: , Rfl:    glipiZIDE  (GLUCOTROL  XL) 5 MG 24 hr tablet, Take 2 tablets (10 mg total) by mouth daily. (Patient taking differently: Take 5 mg by mouth 2 (two) times daily.), Disp: , Rfl:    HYDROcodone -acetaminophen  (NORCO/VICODIN) 5-325 MG tablet, Take 1 tablet by mouth every 6 (six) hours as needed for moderate pain., Disp: 30 tablet, Rfl: 0   ipratropium (ATROVENT) 0.06 % nasal spray, Place 2 sprays into the nose 3 (three) times daily., Disp: , Rfl:    lidocaine  (LIDODERM ) 5 %, Place 1 patch onto the skin daily., Disp: , Rfl:    losartan (COZAAR) 100 MG tablet, Take 100 mg by mouth daily., Disp: , Rfl:    metaxalone (SKELAXIN) 800 MG tablet, Take 800 mg by mouth 3 (three) times daily., Disp: , Rfl:    metFORMIN  (GLUCOPHAGE ) 1000 MG tablet, Take 1,000 mg by mouth 2 (two) times daily., Disp: , Rfl:    metoprolol tartrate (LOPRESSOR) 50 MG tablet, Take 50 mg by mouth 2 (two) times daily., Disp: , Rfl:    NEOMYCIN -POLYMYXIN-HYDROCORTISONE (CORTISPORIN) 1 % SOLN OTIC solution, Apply 1-2 drops to toe BID after soaking, Disp: 10 mL, Rfl: 1   ondansetron  (ZOFRAN ) 4 MG tablet, Take 4 mg by mouth every 8 (eight) hours as needed., Disp: , Rfl:    OZEMPIC, 0.25 OR 0.5 MG/DOSE, 2 MG/3ML SOPN, Inject into the skin., Disp: , Rfl:    rosuvastatin (CRESTOR) 10 MG tablet, Take 10 mg by mouth daily., Disp: , Rfl:   Social History: Social History   Tobacco Use   Smoking status: Never   Smokeless tobacco: Never  Vaping Use   Vaping  status: Never Used  Substance Use Topics   Alcohol use: Yes    Comment: OCCASIONALLY   Drug use: No    Family Medical History: Family History  Problem Relation Age of Onset   Cervical cancer Maternal Grandmother     Physical Examination: Physical Exam MUSCULOSKELETAL: Shoulder shrug strength 5/5 bilaterally. Left shoulder muscle atrophy compared to right. Left shoulder strength 4/5. Left bicep strength 4/5. Right bicep strength 5/5. Hand strength 5/5 bilaterally. NEUROLOGICAL: Right brachioradialis, bicep, tricep reflexes 2+. Left brachioradialis reflex 1+. Left bicep and tricep reflexes absent.  Imaging: Narrative & Impression  CLINICAL DATA:  Cervicalgia. No known injury or prior relevant surgery.   EXAM: MRI CERVICAL SPINE WITHOUT CONTRAST   TECHNIQUE: Multiplanar, multisequence MR imaging of the cervical spine was performed. No intravenous contrast was administered.   COMPARISON:  MRI of the cervical  spine 03/29/2022. Remote radiographs 12/10/2011.   FINDINGS: Technical note: Despite efforts by the technologist and patient, mild motion artifact is present on today's exam and could not be eliminated. This reduces exam sensitivity and specificity.   Alignment: Reversal of the usual cervical lordosis, similar to previous study. No focal angulation or listhesis.   Vertebrae: No acute or suspicious osseous findings.   Cord: Normal in signal and caliber.   Posterior Fossa, vertebral arteries, paraspinal tissues: Visualized portions of the posterior fossa appear unremarkable.Bilateral vertebral artery flow voids. Medially deviated retropharyngeal carotid arteries. No significant paraspinal findings.   Disc levels:   C2-3: Normal interspace.   C3-4: Preserved disc height with stable minimal uncinate spurring. No spinal stenosis or foraminal narrowing.   C4-5: Preserved disc height with mild disc bulging and bilateral uncinate spurring. No spinal stenosis. Mild  left foraminal narrowing.   C5-6: Spondylosis with loss of disc height, a chronic right paracentral disc protrusion and bilateral uncinate spurring. Unchanged mild spinal stenosis and mild to moderate foraminal narrowing bilaterally.   C6-7: Preserved disc height with stable mild disc bulging, a small central disc protrusion and mild bilateral uncinate spurring. No spinal stenosis. Stable moderate left and mild right foraminal narrowing.   C7-T1: Stable asymmetric left-sided facet hypertrophy. Disc height and hydration are maintained. No spinal stenosis or significant foraminal narrowing.   IMPRESSION: 1. No acute findings or clear explanation for the patient's symptoms. No significant change from previous MRI of 03/29/2022. 2. Stable multilevel cervical spondylosis, most advanced at C5-6 where there is mild spinal stenosis and mild to moderate foraminal narrowing bilaterally. 3. Stable moderate left and mild right foraminal narrowing at C6-7. 4. No evidence of cord deformity or abnormal cord signal.     Electronically Signed   By: Elsie Perone M.D.   On: 12/04/2023 16:34     I have personally reviewed the images and agree with the above interpretation.  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 Previous treatments provided temporary relief. Differential includes rotator cuff and bicep injury for which she's previously had surgery. Discussed surgical options: foraminotomy, anterior cervical discectomy with fusion, and disc arthroplasty. Emphasized nerve pain relief, and discussed that she'd likely still have Musculoskeletal issues based off of her previous shoulder injuryl - Ordered neck x-ray with flexion and extension to assess instability. - Follow up after x-ray to discuss surgical options. - Consider foraminotomy, anterior cervical discectomy with fusion, or disc  arthroplasty based on x-ray findings. - Schedule follow-up in 1-2 weeks with interpreter to discuss x-ray results and surgical options.  Thank you for involving me in the care of this patient.    Penne MICAEL Sharps MD/MSCR Neurosurgery

## 2024-02-26 ENCOUNTER — Ambulatory Visit (INDEPENDENT_AMBULATORY_CARE_PROVIDER_SITE_OTHER): Admitting: Neurosurgery

## 2024-02-26 ENCOUNTER — Encounter: Payer: Self-pay | Admitting: Neurosurgery

## 2024-02-26 ENCOUNTER — Ambulatory Visit (INDEPENDENT_AMBULATORY_CARE_PROVIDER_SITE_OTHER)

## 2024-02-26 VITALS — BP 118/88 | Wt 306.8 lb

## 2024-02-26 DIAGNOSIS — M5010 Cervical disc disorder with radiculopathy, unspecified cervical region: Secondary | ICD-10-CM | POA: Diagnosis not present

## 2024-02-26 DIAGNOSIS — R29898 Other symptoms and signs involving the musculoskeletal system: Secondary | ICD-10-CM | POA: Diagnosis not present

## 2024-02-26 DIAGNOSIS — M50122 Cervical disc disorder at C5-C6 level with radiculopathy: Secondary | ICD-10-CM

## 2024-02-26 DIAGNOSIS — M50123 Cervical disc disorder at C6-C7 level with radiculopathy: Secondary | ICD-10-CM | POA: Diagnosis not present

## 2024-03-18 ENCOUNTER — Ambulatory Visit: Payer: Self-pay | Admitting: Neurosurgery

## 2024-03-23 ENCOUNTER — Ambulatory Visit: Admitting: Neurosurgery

## 2024-03-23 ENCOUNTER — Other Ambulatory Visit: Payer: Self-pay

## 2024-03-23 ENCOUNTER — Ambulatory Visit: Payer: Self-pay | Admitting: Neurosurgery

## 2024-03-23 ENCOUNTER — Encounter: Payer: Self-pay | Admitting: Neurosurgery

## 2024-03-23 VITALS — BP 134/88 | Ht 66.0 in | Wt 302.1 lb

## 2024-03-23 DIAGNOSIS — M5010 Cervical disc disorder with radiculopathy, unspecified cervical region: Secondary | ICD-10-CM

## 2024-03-23 DIAGNOSIS — M50123 Cervical disc disorder at C6-C7 level with radiculopathy: Secondary | ICD-10-CM

## 2024-03-23 DIAGNOSIS — R29898 Other symptoms and signs involving the musculoskeletal system: Secondary | ICD-10-CM | POA: Insufficient documentation

## 2024-03-23 DIAGNOSIS — G8929 Other chronic pain: Secondary | ICD-10-CM | POA: Diagnosis not present

## 2024-03-23 DIAGNOSIS — M50122 Cervical disc disorder at C5-C6 level with radiculopathy: Secondary | ICD-10-CM

## 2024-03-23 DIAGNOSIS — Z01818 Encounter for other preprocedural examination: Secondary | ICD-10-CM

## 2024-03-23 NOTE — Patient Instructions (Signed)
 Please see below for information in regards to your upcoming surgery:   Planned surgery: C5-6 cervical disc arthroplasty   Surgery date: 05/07/24 at [facility] (Medical Mall: [address], [place], [place] [address]) - you will find out your arrival time the business day before your surgery.   Pre-op appointment at [facility] Pre-admit Testing: you will receive a call with a date/time for this appointment. If you are scheduled for an in person appointment, Pre-admit Testing is located on the first floor of the [facility] building, [address], [address]. During this appointment, they will advise you which medications you can take the morning of surgery, and which medications you will need to hold for surgery. Labs (such as blood work, EKG) may be done at your pre-op appointment. You are not required to fast for these labs. Should you need to change your pre-op appointment, please call Pre-admit testing at [phone].     Diabetes/heart failure/kidney disease/weight loss medications that require an extended hold: Per anesthesia guidelines (due to the increased risk of aspiration caused by delayed gastric emptying):  Metformin : hold for 2 days prior to surgery Semaglutide (Ozempic) injectible: hold for 7 days prior to surgery    Surgical clearance: we will send a clearance form to Minor, Jonette Penning, PA. They may wish to see you in their office prior to signing the clearance form. If so, they may call you to schedule an appointment.   Common restrictions after spine surgery: No bending, lifting, or twisting ("BLT"). Avoid lifting objects heavier than 10 pounds for the first 6 weeks after surgery. Where possible, avoid household activities that involve lifting, bending, reaching, pushing, or pulling such as laundry, vacuuming, grocery shopping, and childcare. Try to arrange for help from friends and family for these activities while you heal.  Do not drive while taking prescription pain medication. Weeks 6 through 12 after surgery: avoid lifting more than 25 pounds.     X-rays after surgery: Because you are having a fusion or arthroplasty: for appointments after your 2 week follow-up: please arrive our office 30 minutes prior to your appointment for x-rays. This applies to every appointment after your 2 week follow-up. Failure to do so may result in your appointment being rescheduled.     How to contact us :  If you have any questions/concerns before or after surgery, you can reach us  at [phone], or you can send a mychart message. We can be reached by phone or mychart 8am-4pm, Monday-Friday.  *Please note: Calls after 4pm are forwarded to a third party answering service. Mychart messages are not routinely monitored during evenings, weekends, and holidays. Please call our office to contact the answering service for urgent concerns during non-business hours.    If you have FMLA/disability paperwork, please drop it off or fax it to [phone]   Appointments/FMLA & disability paperwork: Reche Hait, & Nichole Registered Nurse/Surgery scheduler: Kendelyn, RN & Katie, RN Certified Medical Assistants: Don, CMA, Elenor, CMA, Damien, CMA, & Auston, [place] Physician Assistants: Lyle Decamp, PA-C, Edsel Goods, PA-C & Glade Boys, PA-C Surgeons: Penne Sharps, MD & Reeves Daisy, MD   [facility] PREADMIT TESTING VISIT and SURGERY INFORMATION SHEET   Now that surgery has been scheduled you can anticipate several phone calls from [facility] services. A pharmacy technician will call you to verify your current list of medications taken at home.               The  Pre-Service Center will call to verify your insurance information and to give you billing estimates and information.             The Preadmit Testing Office will be calling to schedule a visit to obtain information for the anesthesia team and  provide instructions on preparation for surgery.  What can you expect for the Preadmit Testing Visit: Appointments may be scheduled in-person or by telephone.  If a telephone visit is scheduled, you may be asked to come into the office to have lab tests or other studies performed.   This visit will not be completed any greater than 14 days prior to your surgery.  If your surgery has been scheduled for a future date, please do not be alarmed if we have not contacted you to schedule an appointment more than a month prior to the surgery date.    Please be prepared to provide the following information during this appointment:            -Personal medical history                                               -Medication and allergy list            -Any history of problems with anesthesia              -Recent lab work or diagnostic studies            -Please notify us  of any needs we should be aware of to provide the best care possible           -You will be provided with instructions on how to prepare for your surgery.    On The Day of Surgery:  You must have a driver to take you home after surgery, you will be asked not to drive for 24 hours following surgery.  Taxi, Gisele and non-medical transport will not be acceptable means of transportation unless you have a responsible individual who will be traveling with you.  Visitors in the surgical area:   2 people will be able to visit you in your room once your preparation for surgery has been completed. During surgery, your visitors will be asked to wait in the Surgery Waiting Area.  It is not a requirement for them to stay, if they prefer to leave and come back.  Your visitor(s) will be given an update once the surgery has been completed.  No visitors are allowed in the initial recovery room to respect patient privacy and safety.  Once you are more awake and transfer to the secondary recovery area, or are transferred to an inpatient room, visitors  will again be able to see you.  To respect and protect your privacy: We will ask on the day of surgery who your driver will be and what the contact number for that individual will be. We will ask if it is okay to share information with this individual, or if there is an alternative individual that we, or the surgeon, should contact to provide updates and information. If family or friends come to the surgical information desk requesting information about you, who you have not listed with us , no information will be given.   It may be helpful to designate someone as the main contact who will be responsible for updating your other friends and  family.    PREADMIT TESTING OFFICE: [phone] SAME DAY SURGERY: [phone] We look forward to caring for you before and throughout the process of your surgery.

## 2024-03-23 NOTE — Progress Notes (Signed)
 Referring Physician:  Weyman Bright, MD [address] [place],  [place] [address]  Primary Physician:  Weyman Bright, MD  History of Present Illness: 02/26/2024 Discussed the use of AI scribe software for clinical note transcription with the patient, who gave verbal consent to proceed.  History of Present Illness Paula Flores is a 58 year old female who presents with chronic neck pain radiating to the shoulder and arm. She was referred by Doctor Dodson for evaluation of chronic neck pain and consideration of surgical options.  Chronic neck pain is primarily on the left side, radiating into the shoulder and causing occasional cramping sensations in the arm. The pain has persisted since a fall on ice in 2018 and is not exacerbated by neck movements. Previous shoulder surgery improved shoulder condition but did not alleviate neck pain. Physical therapy provided some relief but did not resolve the pain completely. Injections offered temporary relief for a few months. An MRI from August 13th shows disc protrusions at C5-C6 and C6-C7. She experiences weakness in the left shoulder and bicep, with notable nerve pain and a feeling of tiredness in the arm. There is no worsening of pain with neck movement.  She is accompanied by her family. She has bulging discs at C5-C6 and C6-C7 and received spinal injections. The first injection at C5-6 gave about three months of relief, and the later injections did not help. Prior shoulder surgery did not improve her shoulder pain. Her current pain remains localized to the neck and shoulder with radiation down the lateral arm  Conservative measures:  Physical therapy:  Has  participated in at [facility]. Multimodal medical therapy including regular antiinflammatories:  Tylenol , Celebrex , Gabapentin, Hydrocodone , Lidocaine  patch Injections: 03/25 and  11/14/2023 C5-6 TFESI  The symptoms are causing a significant impact on the patient's life.   I have utilized the  care everywhere function in epic to review the outside records available from external health systems.  Review of Systems:  A 10 point review of systems is negative, except for the pertinent positives and negatives detailed in the HPI.  Past Medical History: Past Medical History:  Diagnosis Date   Anemia    Arthritis    Bacterial infection due to H. pylori    COVID-19 virus detected 6/13/20230   Diabetes mellitus without complication (HCC)    Hypertension    Lab test positive for detection of COVID-19 virus 10/16/2018   Mastitis 2014    Past Surgical History: Past Surgical History:  Procedure Laterality Date   ABLATION ON ENDOMETRIOSIS  09/04/2011   BOWEL RESECTION  1994   BREAST BIOPSY  11/03/2014   CESAREAN SECTION  1988   CHOLECYSTECTOMY  1994   had bowel resection   COLONOSCOPY WITH PROPOFOL  N/A 11/24/2019   Procedure: COLONOSCOPY WITH PROPOFOL ;  Surgeon: Janalyn Keene NOVAK, MD;  Location: [facility];  Service: Endoscopy;  Laterality: N/A;   FOOT SURGERY Right 2014   HYSTEROSCOPY WITH D & C N/A 10/13/2015   Procedure: DILATATION AND CURETTAGE /HYSTEROSCOPY;  Surgeon: Glory High, MD;  Location: [facility];  Service: Gynecology;  Laterality: N/A;   SHOULDER ARTHROSCOPY WITH OPEN ROTATOR CUFF REPAIR Left 01/19/2020   Procedure: LEFT SHOULDER ARTHROSCOPY WITH DEBRIDEMENT, DECOMPRESSION, POSSIBLE ROTATOR CUFF REPAIR, AND POSSIBLE BICEPS TENODESIS;  Surgeon: Edie Norleen PARAS, MD;  Location: [facility];  Service: Orthopedics;  Laterality: Left;   TONSILLECTOMY     TONSILLECTOMY  2008   TUBAL LIGATION  1994    Allergies: Allergies as of 02/05/2024 -  Review Complete 10/16/2023  Allergen Reaction Noted   Penicillins Rash 07/23/2012    Medications:  Current Outpatient Medications:    acetaminophen  (TYLENOL ) 500 MG tablet, Take 500 mg by mouth every 6 (six) hours as needed., Disp: , Rfl:    amLODipine (NORVASC) 5 MG tablet, Take 5 mg by mouth daily., Disp: , Rfl:     atorvastatin (LIPITOR) 10 MG tablet, Take by mouth., Disp: , Rfl:    celecoxib  (CELEBREX ) 200 MG capsule, TAKE 1 CAPSULE BY MOUTH TWICE A DAY, Disp: 60 capsule, Rfl: 3   cetirizine (ZYRTEC) 10 MG tablet, Take 10 mg by mouth daily., Disp: , Rfl:    chlorthalidone (HYGROTON) 25 MG tablet, Take 25 mg by mouth daily., Disp: , Rfl:    clotrimazole-betamethasone (LOTRISONE) cream, Apply 1 Application topically 4 (four) times daily as needed., Disp: , Rfl:    D3-50 1.25 MG (50000 UT) capsule, Take 50,000 Units by mouth every Thursday. , Disp: , Rfl:    diclofenac Sodium (VOLTAREN) 1 % GEL, Apply 2 g topically 4 (four) times daily., Disp: , Rfl:    etodolac  (LODINE ) 400 MG tablet, Take 400 mg by mouth 2 (two) times daily as needed for mild pain. , Disp: , Rfl:    gabapentin (NEURONTIN) 300 MG capsule, Take 300 mg by mouth 3 (three) times daily., Disp: , Rfl:    glipiZIDE  (GLUCOTROL  XL) 5 MG 24 hr tablet, Take 2 tablets (10 mg total) by mouth daily. (Patient taking differently: Take 5 mg by mouth 2 (two) times daily.), Disp: , Rfl:    HYDROcodone -acetaminophen  (NORCO/VICODIN) 5-325 MG tablet, Take 1 tablet by mouth every 6 (six) hours as needed for moderate pain., Disp: 30 tablet, Rfl: 0   ipratropium (ATROVENT) 0.06 % nasal spray, Place 2 sprays into the nose 3 (three) times daily., Disp: , Rfl:    lidocaine  (LIDODERM ) 5 %, Place 1 patch onto the skin daily., Disp: , Rfl:    losartan (COZAAR) 100 MG tablet, Take 100 mg by mouth daily., Disp: , Rfl:    metaxalone (SKELAXIN) 800 MG tablet, Take 800 mg by mouth 3 (three) times daily., Disp: , Rfl:    metFORMIN  (GLUCOPHAGE ) 1000 MG tablet, Take 1,000 mg by mouth 2 (two) times daily., Disp: , Rfl:    metoprolol tartrate (LOPRESSOR) 50 MG tablet, Take 50 mg by mouth 2 (two) times daily., Disp: , Rfl:    NEOMYCIN -POLYMYXIN-HYDROCORTISONE (CORTISPORIN) 1 % SOLN OTIC solution, Apply 1-2 drops to toe BID after soaking, Disp: 10 mL, Rfl: 1   ondansetron  (ZOFRAN ) 4 MG  tablet, Take 4 mg by mouth every 8 (eight) hours as needed., Disp: , Rfl:    OZEMPIC, 0.25 OR 0.5 MG/DOSE, 2 MG/3ML SOPN, Inject into the skin., Disp: , Rfl:    rosuvastatin (CRESTOR) 10 MG tablet, Take 10 mg by mouth daily., Disp: , Rfl:   Social History: Social History   Tobacco Use   Smoking status: Never   Smokeless tobacco: Never  Vaping Use   Vaping status: Never Used  Substance Use Topics   Alcohol use: Yes    Comment: OCCASIONALLY   Drug use: No    Family Medical History: Family History  Problem Relation Age of Onset   Cervical cancer Maternal Grandmother     Physical Examination: Physical Exam MUSCULOSKELETAL: Shoulder shrug strength 5/5 bilaterally. Left shoulder muscle atrophy compared to right. Left shoulder strength 4/5. Left bicep strength 4/5. Right bicep strength 5/5. Hand strength 5/5 bilaterally. NEUROLOGICAL: Right brachioradialis, bicep,  tricep reflexes 2+. Left brachioradialis reflex 1+. Left bicep and tricep reflexes absent.     Imaging: Narrative & Impression  CLINICAL DATA:  Cervicalgia. No known injury or prior relevant surgery.   EXAM: MRI CERVICAL SPINE WITHOUT CONTRAST   TECHNIQUE: Multiplanar, multisequence MR imaging of the cervical spine was performed. No intravenous contrast was administered.   COMPARISON:  MRI of the cervical spine 03/29/2022. Remote radiographs 12/10/2011.   FINDINGS: Technical note: Despite efforts by the technologist and patient, mild motion artifact is present on today's exam and could not be eliminated. This reduces exam sensitivity and specificity.   Alignment: Reversal of the usual cervical lordosis, similar to previous study. No focal angulation or listhesis.   Vertebrae: No acute or suspicious osseous findings.   Cord: Normal in signal and caliber.   Posterior Fossa, vertebral arteries, paraspinal tissues: Visualized portions of the posterior fossa appear unremarkable.Bilateral vertebral artery  flow voids. Medially deviated retropharyngeal carotid arteries. No significant paraspinal findings.   Disc levels:   C2-3: Normal interspace.   C3-4: Preserved disc height with stable minimal uncinate spurring. No spinal stenosis or foraminal narrowing.   C4-5: Preserved disc height with mild disc bulging and bilateral uncinate spurring. No spinal stenosis. Mild left foraminal narrowing.   C5-6: Spondylosis with loss of disc height, a chronic right paracentral disc protrusion and bilateral uncinate spurring. Unchanged mild spinal stenosis and mild to moderate foraminal narrowing bilaterally.   C6-7: Preserved disc height with stable mild disc bulging, a small central disc protrusion and mild bilateral uncinate spurring. No spinal stenosis. Stable moderate left and mild right foraminal narrowing.   C7-T1: Stable asymmetric left-sided facet hypertrophy. Disc height and hydration are maintained. No spinal stenosis or significant foraminal narrowing.   IMPRESSION: 1. No acute findings or clear explanation for the patient's symptoms. No significant change from previous MRI of 03/29/2022. 2. Stable multilevel cervical spondylosis, most advanced at C5-6 where there is mild spinal stenosis and mild to moderate foraminal narrowing bilaterally. 3. Stable moderate left and mild right foraminal narrowing at C6-7. 4. No evidence of cord deformity or abnormal cord signal.     Electronically Signed   By: Elsie Perone M.D.   On: 12/04/2023 16:34     I have personally reviewed the images and agree with the above interpretation.  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 Pain has persisted since 2018, with limited relief from previous spinal injections and shoulder surgery. Pain radiates from the neck to the shoulder and down the arm, with the  worst compression at C5-C6. Previous injections provided temporary relief, indicating C5-C6 as the problematic level. Surgery is considered due to lack of long-term relief from conservative measures. Discussed surgical options: anterior cervical discectomy and fusion (ACDF) versus cervical disk arthroplasty versus posterior cervical foraminotomy. Anterior approach preferred for disc replacement to alleviate nerve pain. Discussed risks of surgery, including potential swallowing difficulties and rare risk of spinal cord injury. Informed consent obtained, with a 70% chance of pain relief given the chronicity of her issues.  - Scheduled C5-6 anterior cervical disc arthroplasty for January, risk and benefits were discussed, alternatives were discussed.  Patient would like to go forward with surgery, given persistent weakness, pain and deficits refractory to conservative management.  Thank you for involving me in the care of this patient.    Penne MICAEL Sharps MD/MSCR Neurosurgery

## 2024-04-09 ENCOUNTER — Telehealth: Payer: Self-pay

## 2024-04-09 NOTE — Telephone Encounter (Signed)
 Paula Flores called requesting to cancel surgery. She states she does not feel ready to proceed and is unsure if she wants surgery. I offered her another appointment (in person or telephone) with Dr Claudene to further discuss, but she declined. She stated that she will call back if she is ready to schedule.  I have notified the OR and rep. All corresponding appointments have been canceled.

## 2024-05-04 ENCOUNTER — Telehealth: Payer: Self-pay | Admitting: Podiatry

## 2024-05-04 NOTE — Telephone Encounter (Signed)
 Patient called states her insurance requires authorization for the patches.

## 2024-05-06 ENCOUNTER — Ambulatory Visit: Admitting: Podiatry

## 2024-05-06 DIAGNOSIS — M778 Other enthesopathies, not elsewhere classified: Secondary | ICD-10-CM | POA: Diagnosis not present

## 2024-05-06 MED ORDER — TRIAMCINOLONE ACETONIDE 40 MG/ML IJ SUSP
20.0000 mg | Freq: Once | INTRAMUSCULAR | Status: AC
  Administered 2024-05-06: 20 mg

## 2024-05-06 NOTE — Progress Notes (Signed)
 Paula Flores presents today stating that her feet have been throbbing since December.  She never received her lidocaine  patches because they needed to be presorted and she states that our office failed to do so.  She is requesting injections today.  Objective: Vital signs are stable alert and oriented x 3.  Pulses are palpable.  There is no erythema edema cellulitis drainage or odor.  She has pain on palpation of the sinus tarsi of the bilateral foot as well as pain on end range of motion of the subtalar joint.  Assessment: Subtalar joint capsulitis osteoarthritis.  Sinus tarsitis bilateral.  Plan: Discussed etiology pathology and surgical therapies organ to continue to try to pre-CERT her lidocaine  patches.  We are also going to continue to inject her subtalar joint until her patches command.  Injecting the subtalar joint today with Kenalog  and local anesthetic.  She tolerated procedure well without complications both were injected.

## 2024-05-07 ENCOUNTER — Ambulatory Visit: Admit: 2024-05-07 | Admitting: Neurosurgery

## 2024-05-07 DIAGNOSIS — R29898 Other symptoms and signs involving the musculoskeletal system: Secondary | ICD-10-CM | POA: Insufficient documentation

## 2024-05-07 DIAGNOSIS — M5010 Cervical disc disorder with radiculopathy, unspecified cervical region: Secondary | ICD-10-CM | POA: Insufficient documentation

## 2024-05-07 SURGERY — CERVICAL ANTERIOR DISC ARTHROPLASTY
Anesthesia: General

## 2024-05-20 ENCOUNTER — Encounter: Admitting: Physician Assistant

## 2024-06-15 ENCOUNTER — Other Ambulatory Visit

## 2024-06-15 ENCOUNTER — Encounter: Admitting: Neurosurgery

## 2024-07-22 ENCOUNTER — Encounter: Admitting: Physician Assistant

## 2024-07-22 ENCOUNTER — Other Ambulatory Visit
